# Patient Record
Sex: Female | Born: 1975 | Hispanic: No | Marital: Married | Smoking: Never smoker
Health system: Southern US, Community
[De-identification: ages and names within clinical notes are randomized; demographics above are authoritative.]

## PROBLEM LIST (undated history)

## (undated) DIAGNOSIS — R000 Tachycardia, unspecified: Secondary | ICD-10-CM

## (undated) DIAGNOSIS — F438 Other reactions to severe stress: Secondary | ICD-10-CM

## (undated) DIAGNOSIS — D179 Benign lipomatous neoplasm, unspecified: Secondary | ICD-10-CM

## (undated) DIAGNOSIS — F419 Anxiety disorder, unspecified: Secondary | ICD-10-CM

## (undated) DIAGNOSIS — F4389 Other reactions to severe stress: Secondary | ICD-10-CM

## (undated) DIAGNOSIS — T7840XA Allergy, unspecified, initial encounter: Secondary | ICD-10-CM

## (undated) DIAGNOSIS — D219 Benign neoplasm of connective and other soft tissue, unspecified: Secondary | ICD-10-CM

## (undated) DIAGNOSIS — D649 Anemia, unspecified: Secondary | ICD-10-CM

## (undated) DIAGNOSIS — R51 Headache: Secondary | ICD-10-CM

## (undated) DIAGNOSIS — R002 Palpitations: Secondary | ICD-10-CM

## (undated) DIAGNOSIS — H8101 Meniere's disease, right ear: Secondary | ICD-10-CM

## (undated) DIAGNOSIS — K219 Gastro-esophageal reflux disease without esophagitis: Secondary | ICD-10-CM

## (undated) HISTORY — DX: Meniere's disease, right ear: H81.01

## (undated) HISTORY — DX: Other reactions to severe stress: F43.8

## (undated) HISTORY — DX: Palpitations: R00.2

## (undated) HISTORY — DX: Anxiety disorder, unspecified: F41.9

## (undated) HISTORY — DX: Anemia, unspecified: D64.9

## (undated) HISTORY — PX: NO PAST SURGERIES: SHX2092

## (undated) HISTORY — DX: Benign lipomatous neoplasm, unspecified: D17.9

## (undated) HISTORY — DX: Gastro-esophageal reflux disease without esophagitis: K21.9

## (undated) HISTORY — DX: Allergy, unspecified, initial encounter: T78.40XA

## (undated) HISTORY — DX: Benign neoplasm of connective and other soft tissue, unspecified: D21.9

## (undated) HISTORY — DX: Other reactions to severe stress: F43.89

## (undated) HISTORY — DX: Headache: R51

---

## 2000-12-14 LAB — OB RESULTS CONSOLE RUBELLA ANTIBODY, IGM: Rubella: IMMUNE

## 2000-12-14 LAB — OB RESULTS CONSOLE ABO/RH: RH Type: POSITIVE

## 2000-12-14 LAB — OB RESULTS CONSOLE ANTIBODY SCREEN: Antibody Screen: NEGATIVE

## 2000-12-14 LAB — OB RESULTS CONSOLE HEPATITIS B SURFACE ANTIGEN: Hepatitis B Surface Ag: NEGATIVE

## 2010-03-10 ENCOUNTER — Ambulatory Visit: Admit: 2010-03-10 | Payer: Self-pay | Admitting: Family

## 2010-04-07 ENCOUNTER — Encounter: Payer: Self-pay | Admitting: Family Medicine

## 2010-04-28 ENCOUNTER — Encounter: Payer: Self-pay | Admitting: Family Medicine

## 2010-04-28 ENCOUNTER — Other Ambulatory Visit: Payer: Self-pay | Admitting: Family Medicine

## 2010-04-28 ENCOUNTER — Ambulatory Visit (INDEPENDENT_AMBULATORY_CARE_PROVIDER_SITE_OTHER): Payer: BC Managed Care – PPO | Admitting: Family Medicine

## 2010-04-28 DIAGNOSIS — F438 Other reactions to severe stress: Secondary | ICD-10-CM

## 2010-04-28 DIAGNOSIS — R519 Headache, unspecified: Secondary | ICD-10-CM | POA: Insufficient documentation

## 2010-04-28 DIAGNOSIS — R51 Headache: Secondary | ICD-10-CM

## 2010-04-28 DIAGNOSIS — D179 Benign lipomatous neoplasm, unspecified: Secondary | ICD-10-CM | POA: Insufficient documentation

## 2010-04-28 DIAGNOSIS — Z23 Encounter for immunization: Secondary | ICD-10-CM

## 2010-04-28 DIAGNOSIS — R002 Palpitations: Secondary | ICD-10-CM

## 2010-04-28 DIAGNOSIS — F4389 Other reactions to severe stress: Secondary | ICD-10-CM | POA: Insufficient documentation

## 2010-04-28 LAB — BASIC METABOLIC PANEL
BUN: 16 mg/dL (ref 6–23)
Creatinine, Ser: 0.8 mg/dL (ref 0.4–1.2)
GFR: 83.46 mL/min (ref >60.00)
Glucose, Bld: 85 mg/dL (ref 70–99)

## 2010-04-28 LAB — CBC WITH DIFFERENTIAL/PLATELET
Basophils Relative: 0.3 % (ref 0.0–3.0)
Eosinophils Absolute: 0.3 10*3/uL (ref 0.0–0.7)
HCT: 39.3 % (ref 36.0–46.0)
Hemoglobin: 13.4 g/dL (ref 12.0–15.0)
Lymphs Abs: 2.7 10*3/uL (ref 0.7–4.0)
MCHC: 34.2 g/dL (ref 30.0–36.0)
MCV: 84.3 fl (ref 78.0–100.0)
Monocytes Absolute: 0.7 10*3/uL (ref 0.1–1.0)
Neutro Abs: 5.9 10*3/uL (ref 1.4–7.7)
Neutrophils Relative %: 61.3 % (ref 43.0–77.0)
RBC: 4.66 Mil/uL (ref 3.87–5.11)

## 2010-04-28 LAB — SEDIMENTATION RATE: Sed Rate: 15 mm/hr (ref 0–22)

## 2010-04-28 LAB — TSH: TSH: 0.96 u[IU]/mL (ref 0.35–5.50)

## 2010-04-28 LAB — HEPATIC FUNCTION PANEL
Albumin: 4.2 g/dL (ref 3.5–5.2)
Total Bilirubin: 0.4 mg/dL (ref 0.3–1.2)

## 2010-05-02 NOTE — Assessment & Plan Note (Signed)
Summary: new to estab/cbs   Vital Signs:  Patient profile:   35 year old female Height:      64.25 inches Weight:      162 pounds BMI:     27.69 Temp:     98.9 degrees F oral Pulse rate:   72 / minute BP sitting:   120 / 76  (left arm)  Vitals Entered By: Jeremy Johann CMA (April 28, 2010 1:02 PM) CC: new to establish, lump on right lower back, increase pulse rate, no fasting   History of Present Illness: Pt here to establish. Pt c/o of lump R side back.  Pt had gyn-Dr Tracey Harries --look at it and he reccommended she have someone look at it.   Pt c/o pulse rate going up for no reason.  Pt saw DR in [place].  She was dx with stress/ anxiety --she was given what sounds like xanax  and inderal for 1 month.  Everything stopped but now she has had problems with palpatations and heart rate goes up to 160 with min exertion.  She also has chest pain in left side of chest ---no radiation.    Pt also c/o headache 1-2 x a month---she was told they were vascular headaches.  They are worse with more sleep.     Preventive Screening-Counseling & Management  Alcohol-Tobacco     Smoking Status: never      Drug Use:  no.    Problems Prior to Update: 1)  Headache  (ICD-784.0) 2)  Other Acute Reactions To Stress  (ICD-308.3) 3)  Palpitations, Recurrent  (ICD-785.1)  Medications Prior to Update: 1)  None  Current Medications (verified): 1)  None  Allergies (verified): No Known Drug Allergies  Past History:  Past Medical History: none  Past Surgical History: none  Family History: Reviewed history and no changes required. HTN-BOTH parrents  Social History: Reviewed history and no changes required. Married Never Smoked Alcohol use-no Drug use-no Smoking Status:  never Drug Use:  no  Review of Systems      See HPI  Physical Exam  General:  Well-developed,well-nourished,in no acute distress; alert,appropriate and cooperative throughout examination Neck:  No deformities,  masses, or tenderness noted. Lungs:  Normal respiratory effort, chest expands symmetrically. Lungs are clear to auscultation, no crackles or wheezes. Heart:  normal rate and no murmur.   Extremities:  No clubbing, cyanosis, edema, or deformity noted with normal full range of motion of all joints.   Skin:  Intact without suspicious lesions or rashes lipoma R side back Psych:  Oriented X3, normally interactive, not anxious appearing, and not depressed appearing.     Impression & Recommendations:  Problem # 1:  HEADACHE (ICD-784.0) Assessment Deteriorated  Orders: Venipuncture (04540) TLB-CBC Platelet - w/Differential (85025-CBCD) TLB-BMP (Basic Metabolic Panel-BMET) (80048-METABOL) TLB-Hepatic/Liver Function Pnl (80076-HEPATIC) TLB-TSH (Thyroid Stimulating Hormone) (84443-TSH) TLB-Sedimentation Rate (ESR) (85652-ESR) Specimen Handling (98119)  Problem # 2:  OTHER ACUTE REACTIONS TO STRESS (ICD-308.3) Assessment: Improved  Orders: Venipuncture (14782) TLB-CBC Platelet - w/Differential (85025-CBCD) TLB-BMP (Basic Metabolic Panel-BMET) (80048-METABOL) TLB-Hepatic/Liver Function Pnl (80076-HEPATIC) TLB-TSH (Thyroid Stimulating Hormone) (84443-TSH) TLB-Sedimentation Rate (ESR) (85652-ESR) Specimen Handling (95621)  Problem # 3:  PALPITATIONS, RECURRENT (ICD-785.1) Assessment: Deteriorated  Orders: Venipuncture (30865) TLB-CBC Platelet - w/Differential (85025-CBCD) TLB-BMP (Basic Metabolic Panel-BMET) (80048-METABOL) TLB-Hepatic/Liver Function Pnl (80076-HEPATIC) TLB-TSH (Thyroid Stimulating Hormone) (84443-TSH) TLB-Sedimentation Rate (ESR) (85652-ESR) Cardiology Referral (Cardiology) Specimen Handling (78469) EKG w/ Interpretation (93000)  Avoid caffeine, chocolate, and stimulants. Stress reduction as well as medication options discussed.  Problem # 4:  LIPOMA (ICD-214.9)  Other Orders: Tdap => 17yrs IM (62130) Admin 1st Vaccine (86578)   Orders Added: 1)   Venipuncture [46962] 2)  TLB-CBC Platelet - w/Differential [85025-CBCD] 3)  TLB-BMP (Basic Metabolic Panel-BMET) [80048-METABOL] 4)  TLB-Hepatic/Liver Function Pnl [80076-HEPATIC] 5)  TLB-TSH (Thyroid Stimulating Hormone) [84443-TSH] 6)  TLB-Sedimentation Rate (ESR) [85652-ESR] 7)  Cardiology Referral [Cardiology] 8)  Specimen Handling [99000] 9)  Tdap => 44yrs IM [90715] 10)  Admin 1st Vaccine [90471] 11)  New Patient Level III [95284] 12)  EKG w/ Interpretation [93000]   Immunizations Administered:  Tetanus Vaccine:    Vaccine Type: Tdap    Site: left deltoid    Mfr: Merck    Dose: 0.5 ml    Route: IM    Given by: Almeta Monas CMA (AAMA)    Exp. Date: 01/06/2012    Lot #: [id number]    VIS given: 12/31/07 version given April 28, 2010.   Immunizations Administered:  Tetanus Vaccine:    Vaccine Type: Tdap    Site: left deltoid    Mfr: Merck    Dose: 0.5 ml    Route: IM    Given by: Almeta Monas CMA (AAMA)    Exp. Date: 01/06/2012    Lot #: [id number]    VIS given: 12/31/07 version given April 28, 2010.  Flu Vaccine Next Due:  Refused TD Result Date:  04/29/2002 TD Result:  given TD Next Due:  10 yr PAP Result Date:  04/07/2010 PAP Result:  normal PAP Next Due:  1 yr

## 2010-05-04 NOTE — Progress Notes (Signed)
Letter mailed to patient with results...Marland KitchenMarland KitchenMarland Kitchen KP

## 2010-05-12 ENCOUNTER — Encounter: Payer: Self-pay | Admitting: *Deleted

## 2010-05-16 ENCOUNTER — Encounter: Payer: Self-pay | Admitting: Cardiovascular Disease

## 2010-05-16 NOTE — Letter (Signed)
Summary: Primary Care Consult Scheduled Letter  [facility] [place]  [address] [place], [place] [address]   Phone: [phone]  Fax: [phone]      05/12/2010 MRN: [id number]  Rachel Wiley [address] [place], [place]  [address]  [place]    Dear [name],      We have scheduled an appointment for you.  At the recommendation of Dr  Janit Bern, we have scheduled you a consult with Dr.Peter Barbaraann Cao on April 4,2012 at 2pm.  Their address is [address], Adult nurse Cardiology. The office phone number is [phone].  If this appointment day and time is not convenient for you, please feel free to call the office of the doctor you are being referred to at the number listed above and reschedule the appointment.     It is important for you to keep your scheduled appointments. We are here to make sure you are given good patient care. If you have questions or you have made changes to your appointment, please notify us at  [phone], ask for Renee.    Thank you,  Patient Care Coordinator [facility]

## 2010-05-17 ENCOUNTER — Ambulatory Visit (INDEPENDENT_AMBULATORY_CARE_PROVIDER_SITE_OTHER): Payer: BC Managed Care – PPO | Admitting: Cardiovascular Disease

## 2010-05-17 ENCOUNTER — Encounter: Payer: Self-pay | Admitting: Cardiovascular Disease

## 2010-05-17 DIAGNOSIS — R0600 Dyspnea, unspecified: Secondary | ICD-10-CM

## 2010-05-17 DIAGNOSIS — R0989 Other specified symptoms and signs involving the circulatory and respiratory systems: Secondary | ICD-10-CM

## 2010-05-17 DIAGNOSIS — R002 Palpitations: Secondary | ICD-10-CM

## 2010-05-17 NOTE — Assessment & Plan Note (Signed)
Benign sounding with occasional dyspnea.  Stress echo to assess EF and see HR response to exercise.  Normal ECG and cardiopulmonary exam.  Encouraged her to take inderal as needed.  She still has supply from [place].  If symptoms become more frequent can get an event monitor

## 2010-05-17 NOTE — Patient Instructions (Signed)
Your physician has requested that you have a stress echocardiogram. For further information please visit [email or web address]. Please follow instruction sheet as given.   

## 2010-05-17 NOTE — Progress Notes (Signed)
35 yo referred by Dr Laury Axon for palpitatins and dyspnea.  W/U in [place] last year ? Stress.  Improved with Inderal 40mg  but this was stopped last year.  Last couple of months occasional episodes of palpitations and noticing her HR. Seems to have a propensity for exaggerated HR response to exercise.  Reviewed labs by Dr Laury Axon and normal with no anemia or thyroid disease.  Some stress with living arrangements as her and her husband have been back and forth to [place].  No SSCP and mild dyspnea with walking.  No stimulants and only 2 cups of tea /day.  Has not had SSCP, presyncope, edema.  Sometimes headaches have made her pulse faster.   ROS: Denies fever, malais, weight loss, blurry vision, decreased visual acuity, cough, sputum, SOB, hemoptysis, pleuritic pain, palpitaitons, heartburn, abdominal pain, melena, lower extremity edema, claudication, or rash.   General: Affect appropriate Healthy:  appears stated age HEENT: normal Neck supple with no adenopathy JVP normal no bruits no thyromegaly Lungs clear with no wheezing and good diaphragmatic motion Heart:  S1/S2 no murmur,rub, gallop or click PMI normal Abdomen: benighn, BS positve, no tenderness, no AAA no bruit.  No HSM or HJR Distal pulses intact with no bruits No edema Neuro non-focal Skin warm and dry No muscular weakness  Medications No current outpatient prescriptions on file.    Allergies Review of patient's allergies indicates no known allergies.  Family History: Family History  Problem Relation Age of Onset  . Hypertension      Social History: History   Social History  . Marital Status: Married    Spouse Name: N/A    Number of Children: N/A  . Years of Education: N/A   Occupational History  . Not on file.   Social History Main Topics  . Smoking status: Never Smoker   . Smokeless tobacco: Not on file  . Alcohol Use: No  . Drug Use: No  . Sexually Active: Not on file   Other Topics Concern  . Not on file    Social History Narrative   Never SmokedAlcohol use-noDrug use-noSmoking Status:  neverDrug Use:  no    Electrocardiogram:  04/28/10  NSR normal ECG minor sinus arrhythmia  Assessment and Plan

## 2010-05-23 ENCOUNTER — Encounter: Payer: Self-pay | Admitting: Family Medicine

## 2010-05-25 ENCOUNTER — Other Ambulatory Visit: Payer: Self-pay | Admitting: Cardiovascular Disease

## 2010-05-25 DIAGNOSIS — R0600 Dyspnea, unspecified: Secondary | ICD-10-CM

## 2010-05-30 ENCOUNTER — Other Ambulatory Visit (HOSPITAL_COMMUNITY): Payer: BC Managed Care – PPO | Admitting: Radiology

## 2010-06-13 ENCOUNTER — Ambulatory Visit (HOSPITAL_COMMUNITY): Payer: BC Managed Care – PPO | Attending: Cardiovascular Disease | Admitting: Radiology

## 2010-06-13 ENCOUNTER — Encounter: Payer: BC Managed Care – PPO | Admitting: Cardiovascular Disease

## 2010-06-13 ENCOUNTER — Ambulatory Visit (INDEPENDENT_AMBULATORY_CARE_PROVIDER_SITE_OTHER): Payer: BC Managed Care – PPO | Admitting: Cardiovascular Disease

## 2010-06-13 DIAGNOSIS — R0989 Other specified symptoms and signs involving the circulatory and respiratory systems: Secondary | ICD-10-CM | POA: Insufficient documentation

## 2010-06-13 DIAGNOSIS — R002 Palpitations: Secondary | ICD-10-CM

## 2010-06-13 DIAGNOSIS — R0600 Dyspnea, unspecified: Secondary | ICD-10-CM

## 2010-06-13 DIAGNOSIS — R0609 Other forms of dyspnea: Secondary | ICD-10-CM | POA: Insufficient documentation

## 2010-06-13 NOTE — Progress Notes (Signed)
Exercise Treadmill Test  Pre-Exercise Testing Evaluation Rhythm: normal sinus  Rate: 92   PR:  .13 QRS:  .09  QT:  .36 QTc: .45   P axis: 0 degrees  QRS axis:  0 degrees       Test  Exercise Tolerance Test Ordering MD: Charlton Haws, MD  Interpreting MD:  Charlton Haws, MD  Unique Test No:1 Treadmill:  1  Indication for ETT: palpitations  Contraindication to ETT: No   Stress Modality: exercise - treadmill  Cardiac Imaging Performed: non   Protocol: standard Bruce - maximal  Max BP:  144/78  Max MPHR (bpm):  186 85% MPR (bpm):  158  MPHR obtained (bpm):  181 % MPHR obtained:  96%  Reached 85% MPHR (min:sec):  3:00 Total Exercise Time (min-sec): 6:00  Workload in METS:  10.0 Borg Scale: 17  Reason ETT Terminated:  fatigue    ST Segment Analysis At Rest: normal ST segments - no evidence of significant ST depression With Exercise: no evidence of significant ST depression  Other Information Arrhythmia:  No Angina during ETT:  absent (0) Quality of ETT:  diagnostic  ETT Interpretation:  normal - no evidence of ischemia by ST analysis  Comments: Normal ETT with normal HR response   Stages advance at 2:00 intervals  Recommendations: No further cardiac w/u needed

## 2010-07-27 ENCOUNTER — Telehealth: Payer: Self-pay | Admitting: *Deleted

## 2010-07-27 NOTE — Telephone Encounter (Signed)
PT AWARE STRESS ECHO WAS OKAY./CY

## 2010-10-27 ENCOUNTER — Telehealth: Payer: Self-pay | Admitting: Cardiovascular Disease

## 2010-10-27 NOTE — Telephone Encounter (Signed)
Pt on med for palpitations, now [redacted] weeks pregnant, is this ok to take ?

## 2010-10-30 NOTE — Telephone Encounter (Signed)
Discussed pt issues with the pharm md. Inderal is not the best to use in preg but is okay if used sparingly. Per the pt she is having to use the inderal once or twice a month and wants to know what would be safe for her to use. Dr Eden Emms is out this week. Will forward for sally putt pharm md to review and advise Deliah Goody

## 2010-10-30 NOTE — Telephone Encounter (Signed)
Pt [redacted] weeks pregnant. Pt wants to know what medication is safe for pt to take with palpitations . Please return pt call to discuss further.

## 2010-11-07 NOTE — Telephone Encounter (Signed)
Spoke with pt, made aware to only use 1-2 times monthly if needed  [employer]

## 2010-11-07 NOTE — Telephone Encounter (Signed)
Propranolol is category C in pregnancy.  Based on small studies (mostly in pt's on beta blocker for HTN), daily use can cause a smaller birth weight but chance is very small if only using 1-2 times per month.

## 2010-11-09 ENCOUNTER — Encounter: Payer: Self-pay | Admitting: Family Medicine

## 2010-11-09 ENCOUNTER — Ambulatory Visit (INDEPENDENT_AMBULATORY_CARE_PROVIDER_SITE_OTHER): Payer: BC Managed Care – PPO | Admitting: Family Medicine

## 2010-11-09 VITALS — BP 112/74 | HR 75 | Temp 99.4°F | Wt 154.8 lb

## 2010-11-09 DIAGNOSIS — J029 Acute pharyngitis, unspecified: Secondary | ICD-10-CM

## 2010-11-09 DIAGNOSIS — J329 Chronic sinusitis, unspecified: Secondary | ICD-10-CM

## 2010-11-09 LAB — POCT RAPID STREP A (OFFICE): Rapid Strep A Screen: NEGATIVE

## 2010-11-09 MED ORDER — CEFUROXIME AXETIL 500 MG PO TABS: 500.0000 mg | ORAL_TABLET | Freq: Two times a day (BID) | ORAL | Status: DC

## 2010-11-09 NOTE — Progress Notes (Signed)
  Subjective:     Rachel Wiley is a 35 y.o. female who presents for evaluation of symptoms of a URI. Symptoms include bilateral ear pressure/pain, congestion, low grade fever, nasal congestion, productive cough with  yellow and green colored sputum and sore throat. Onset of symptoms was 7 days ago, and has been gradually worsening since that time. Treatment to date: none.  The following portions of the patient's history were reviewed and updated as appropriate: allergies, current medications, past family history, past medical history, past social history, past surgical history and problem list.  Review of Systems Pertinent items are noted in HPI.   Objective:    BP 112/74  Pulse 75  Temp(Src) 99.4 F (37.4 C) (Oral)  Wt 154 lb 12.8 oz (70.217 kg)  SpO2 96% General appearance: alert, cooperative, appears stated age and no distress Ears: normal TM's and external ear canals both ears Nose: turbinates red, swollen, edematous, right turbinate swollen, edematous, inflamed, sinus tenderness bilateral Throat: lips, mucosa, and tongue normal; teeth and gums normal Neck: no adenopathy, no carotid bruit, no JVD, supple, symmetrical, trachea midline and thyroid not enlarged, symmetric, no tenderness/mass/nodules Lungs: clear to auscultation bilaterally Heart: S1, S2 normal   Assessment:    sinusitis   Plan:    Suggested symptomatic OTC remedies. Nasal saline spray for congestion. Ceftin per orders. Follow up as needed.  Pt refused nasal spray

## 2010-11-09 NOTE — Patient Instructions (Signed)

## 2010-11-09 NOTE — Progress Notes (Signed)
Addended by: Arnette Norris on: 11/09/2010 01:38 PM   Modules accepted: Orders

## 2010-11-10 ENCOUNTER — Telehealth: Payer: Self-pay | Admitting: Family Medicine

## 2010-11-10 MED ORDER — CEFDINIR 250 MG/5ML PO SUSR: ORAL | Status: DC

## 2010-11-10 NOTE — Telephone Encounter (Signed)
patient aware Rx sent to the pharmacy     KP

## 2010-11-10 NOTE — Telephone Encounter (Signed)
Please advise Pt given Ceftin for sinusitis

## 2010-11-10 NOTE — Telephone Encounter (Signed)
omnicef 250/59ml 1 tsp po bid for 10 days

## 2010-11-10 NOTE — Telephone Encounter (Signed)
Patient was given antibiotic yesterday she said she is unable to take it she said she cant get it down & if she does she vomits - patient is [redacted] weeks pregnant - wants to know if she can get liquid - [facility] [place]

## 2010-12-15 LAB — OB RESULTS CONSOLE RPR: RPR: NONREACTIVE

## 2011-02-07 ENCOUNTER — Encounter: Payer: Self-pay | Admitting: Family Medicine

## 2011-02-07 ENCOUNTER — Ambulatory Visit (INDEPENDENT_AMBULATORY_CARE_PROVIDER_SITE_OTHER): Payer: BC Managed Care – PPO | Admitting: Family Medicine

## 2011-02-07 VITALS — BP 115/70 | HR 115 | Temp 98.6°F | Ht 64.75 in | Wt 162.6 lb

## 2011-02-07 DIAGNOSIS — J069 Acute upper respiratory infection, unspecified: Secondary | ICD-10-CM

## 2011-02-07 NOTE — Assessment & Plan Note (Signed)
Pt w/out evidence of bacterial infxn on PE.  Most likely viral.  Reviewed supportive care and red flags that should prompt return.  Pt expressed understanding and is in agreement w/ plan.

## 2011-02-07 NOTE — Patient Instructions (Signed)
This is a virus and should improve w/ time Drink plenty of fluids REST! Tylenol for sore throat pain Use Delsym as needed for cough- this is safe in pregnancy Call your OB and ask about a decongestant that you can take- this will help the nasal congestion, ear pain, and drainage Call with any questions or concerns Hang in there! Happy New Year!

## 2011-02-07 NOTE — Progress Notes (Signed)
  Subjective:    Patient ID: Rachel Wiley, female    DOB: November 22, 1975, 35 y.o.   MRN: [id number]  HPI URI- sxs started Monday night w/ L ear pain.  + nasal congestion, sore throat, LAD, cough productive of PND.  No fevers.  + sick contacts.   Review of Systems For ROS see HPI     Objective:   Physical Exam  Vitals reviewed. Constitutional: She appears well-developed and well-nourished. No distress.  HENT:  Head: Normocephalic and atraumatic.  Right Ear: Tympanic membrane normal.  Left Ear: Tympanic membrane normal.  Nose: Mucosal edema and rhinorrhea present. Right sinus exhibits no maxillary sinus tenderness and no frontal sinus tenderness. Left sinus exhibits no maxillary sinus tenderness and no frontal sinus tenderness.  Mouth/Throat: Mucous membranes are normal. Posterior oropharyngeal erythema (w/ PND) present.  Eyes: Conjunctivae and EOM are normal. Pupils are equal, round, and reactive to light.  Neck: Normal range of motion. Neck supple.  Cardiovascular: Normal rate, regular rhythm and normal heart sounds.   Pulmonary/Chest: Effort normal and breath sounds normal. No respiratory distress. She has no wheezes. She has no rales.  Lymphadenopathy:    She has no cervical adenopathy.          Assessment & Plan:

## 2011-02-13 NOTE — L&D Delivery Note (Signed)
Delivery Note At 8:38 PM a viable female was delivered via Vaginal, Spontaneous Delivery (Presentation: Left Occiput Anterior, LOT).  APGAR: 8, 9; weight .   Placenta status: Intact, Spontaneous.  Cord: 3 vessels with the following complications: None.    Anesthesia: Epidural  Episiotomy: None Lacerations: 2nd degree;Perineal Suture Repair: 3.0 vicryl rapide Est. Blood Loss (mL): 400  Mom to postpartum.  Baby to nursery-stable.  BOVARD,Deshaun Schou 06/18/2011, 8:58 PM   O+/ Breast/ Contra?

## 2011-03-15 ENCOUNTER — Encounter: Payer: Self-pay | Admitting: Nurse Practitioner

## 2011-03-15 ENCOUNTER — Ambulatory Visit (INDEPENDENT_AMBULATORY_CARE_PROVIDER_SITE_OTHER): Payer: BC Managed Care – PPO | Admitting: Nurse Practitioner

## 2011-03-15 VITALS — BP 110/80 | Ht 64.0 in | Wt 166.0 lb

## 2011-03-15 DIAGNOSIS — R000 Tachycardia, unspecified: Secondary | ICD-10-CM

## 2011-03-15 DIAGNOSIS — R002 Palpitations: Secondary | ICD-10-CM

## 2011-03-15 NOTE — Progress Notes (Signed)
   Luciana Axe Date of Birth: 1975-04-04 Medical Record #[id number]  History of Present Illness: Ms. Kossman is seen today for a work in visit. She is seen for. Dr. Eden Emms. She has had a history of palpitations and has had a normal stress echo last May. Her EF was normal. She did have grade 2 diastolic dysfunction. She is now pregnant at [redacted] weeks.  She has been referred from her OB for tachycardia. She notes that this past Tuesday she had a little headache. Blood pressure was up slightly and her heart was elevated. She is not having palpitations. She is not short of breath. No chest pain. No cough. No hemoptysis.  She is not lightheaded or dizzy. No syncope. She says her hemoglobin was checked and was fine.  Current Outpatient Prescriptions on File Prior to Visit  Medication Sig Dispense Refill  . folic acid (FOLVITE) 400 MCG tablet Take 600 mcg by mouth daily.        . Prenatal Vit-Fe Fumarate-FA (MULTIVITAMIN-PRENATAL) 27-0.8 MG TABS Take 1 tablet by mouth daily.          No Known Allergies  Past Medical History  Diagnosis Date  . Anxiety   . Palpitations   . Other acute reactions to stress   . Headache   . Lipoma of unspecified site     No past surgical history on file.  History  Smoking status  . Never Smoker   Smokeless tobacco  . Not on file    History  Alcohol Use No    Family History  Problem Relation Age of Onset  . Hypertension      Review of Systems: The review of systems is per the HPI.  All other systems were reviewed and are negative.  Physical Exam: BP 110/80  Ht 5\' 4"  (1.626 m)  Wt 166 lb (75.297 kg)  BMI 28.49 kg/m2 Patient is very pleasant and in no acute distress. Skin is warm and dry. Color is normal.  HEENT is unremarkable. Normocephalic/atraumatic. PERRL. Sclera are nonicteric. Neck is supple. No masses. No JVD. Lungs are clear. No dyspnea noted. Cardiac exam shows a regular rate and rhythm. She is tachycardic.  No murmur noted. Abdomen is soft. She  is pregnant.  Extremities are without edema. Gait and ROM are intact. No gross neurologic deficits noted.   LABORATORY DATA: EKG today shows sinus tachycardia. Rate is 112. Tracing is reviewed with Dr. Graciela Husbands. Prior EKG was also reviewed from 2012. This also shows sinus tachycardia. Her p wave morphology is unchanged.   Assessment / Plan:

## 2011-03-15 NOTE — Patient Instructions (Signed)
I have talked with Dr. Graciela Husbands. We feel like you are ok.  Let us know if you start having any palpitations or are short of breath.  We will see you back as needed.  Call [facility] at [phone] if you have any questions, problems or concerns.

## 2011-03-15 NOTE — Assessment & Plan Note (Addendum)
Her case is reviewed with Dr. Graciela Husbands. Her EKG is felt to be unchanged. P wave morphology is unchanged. She appears asymptomatic at this time and denies symptoms. We do not feel like further testing is indicated at this time. Her echo from 2012 was also reviewed and felt to be satisfactory. We will see her back as needed. She is asking if she can do some deep breathing exercises and I think that is ok for her. Patient is agreeable to this plan and will call if any problems develop in the interim.

## 2011-05-15 ENCOUNTER — Encounter: Payer: Self-pay | Admitting: Family Medicine

## 2011-05-15 ENCOUNTER — Ambulatory Visit (INDEPENDENT_AMBULATORY_CARE_PROVIDER_SITE_OTHER): Payer: BC Managed Care – PPO | Admitting: Family Medicine

## 2011-05-15 ENCOUNTER — Other Ambulatory Visit: Payer: Self-pay | Admitting: *Deleted

## 2011-05-15 DIAGNOSIS — J309 Allergic rhinitis, unspecified: Secondary | ICD-10-CM

## 2011-05-15 DIAGNOSIS — M549 Dorsalgia, unspecified: Secondary | ICD-10-CM

## 2011-05-15 DIAGNOSIS — R32 Unspecified urinary incontinence: Secondary | ICD-10-CM

## 2011-05-15 MED ORDER — BUDESONIDE 32 MCG/ACT NA SUSP: 1.0000 | Freq: Every day | NASAL | Status: DC

## 2011-05-15 NOTE — Telephone Encounter (Signed)
Pt husband left vm stating that her medication ordered for today did not make it to the pharmacy as noted, please advise

## 2011-05-15 NOTE — Patient Instructions (Signed)

## 2011-05-15 NOTE — Telephone Encounter (Signed)
Rx resent and patient has been made aware.     KP

## 2011-05-15 NOTE — Progress Notes (Signed)
  Subjective:     Rachel Wiley is a 36 y.o. female who presents for evaluation of symptoms of a URI. Symptoms include bilateral ear pressure/pain, congestion, facial pain, low grade fever, nasal congestion, post nasal drip, purulent nasal discharge, sinus pressure and sore throat. Onset of symptoms was 2 days ago, and has been gradually worsening since that time. Treatment to date: tylenol.  Pt is pregnant. The following portions of the patient's history were reviewed and updated as appropriate: allergies, current medications, past family history, past medical history, past social history, past surgical history and problem list.  Review of Systems Pertinent items are noted in HPI.   Objective:    BP 100/72  Pulse 125  Temp(Src) 98.5 F (36.9 C) (Oral)  Wt 175 lb 6.4 oz (79.561 kg)  SpO2 98% General appearance: alert, cooperative, appears stated age and no distress Ears: normal TM's and external ear canals both ears Nose: yellow discharge, mild congestion, turbinates red, swollen Throat: lips, mucosa, and tongue normal; teeth and gums normal Neck: no adenopathy, supple, symmetrical, trachea midline and thyroid not enlarged, symmetric, no tenderness/mass/nodules Lungs: clear to auscultation bilaterally Heart: S1, S2 normal Lymph nodes: Cervical, supraclavicular, and axillary nodes normal.   Assessment:    viral upper respiratory illness   Plan:    Discussed diagnosis and treatment of URI. Discussed the importance of avoiding unnecessary antibiotic therapy. Suggested symptomatic OTC remedies. Nasal saline spray for congestion. Nasal steroids per orders. Follow up as needed.

## 2011-05-18 ENCOUNTER — Telehealth: Payer: Self-pay

## 2011-05-18 NOTE — Telephone Encounter (Signed)
Patient left a message stating that her Rhinocort needs a prior authorization? Pharmacy is going to send over prior authorization paperwork.

## 2011-05-18 NOTE — Telephone Encounter (Signed)
Noted will process prior authorization when noted in office

## 2011-05-25 ENCOUNTER — Telehealth: Payer: Self-pay | Admitting: *Deleted

## 2011-05-25 NOTE — Telephone Encounter (Signed)
Rhinocort is a non preferred med, Preferred drugs: Flunisolide, fluticasone, nasonex, triamcinolone. .Please advise

## 2011-05-27 NOTE — Telephone Encounter (Signed)
Ok for [employer] 2 sprays each nostril daily

## 2011-05-28 MED ORDER — FLUTICASONE PROPIONATE 50 MCG/ACT NA SUSP: 2.0000 | Freq: Every day | NASAL | Status: DC

## 2011-05-28 NOTE — Telephone Encounter (Signed)
Discuss with patient, Rx sent. 

## 2011-05-29 LAB — OB RESULTS CONSOLE GBS: GBS: NEGATIVE

## 2011-05-29 NOTE — Telephone Encounter (Signed)
Noted prior authorization submitted/completed.rx sent and pt notified

## 2011-06-17 ENCOUNTER — Inpatient Hospital Stay (HOSPITAL_COMMUNITY)
Admission: AD | Admit: 2011-06-17 | Discharge: 2011-06-17 | Disposition: A | Discharge status: Home / Self Care | Payer: BC Managed Care – PPO | Source: Ambulatory Visit | Attending: Obstetrics and Gynecology | Admitting: Obstetrics and Gynecology

## 2011-06-17 ENCOUNTER — Encounter (HOSPITAL_COMMUNITY): Payer: Self-pay | Admitting: *Deleted

## 2011-06-17 DIAGNOSIS — O4690 Antepartum hemorrhage, unspecified, unspecified trimester: Secondary | ICD-10-CM | POA: Insufficient documentation

## 2011-06-17 DIAGNOSIS — O479 False labor, unspecified: Secondary | ICD-10-CM | POA: Insufficient documentation

## 2011-06-17 HISTORY — DX: Tachycardia, unspecified: R00.0

## 2011-06-17 NOTE — Progress Notes (Signed)
Pt up to BR

## 2011-06-17 NOTE — Discharge Instructions (Signed)
Normal Labor and Delivery Your caregiver must first be sure you are in labor. Signs of labor include:  You may pass what is called "the mucus plug" before labor begins. This is a small amount of blood stained mucus.   Regular uterine contractions.   The time between contractions get closer together.   The discomfort and pain gradually gets more intense.   Pains are mostly located in the back.   Pains get worse when walking.   The cervix (the opening of the uterus becomes thinner (begins to efface) and opens up (dilates).  Once you are in labor and admitted into the hospital or care center, your caregiver will do the following:  A complete physical examination.   Check your vital signs (blood pressure, pulse, temperature and the fetal heart rate).   Do a vaginal examination (using a sterile glove and lubricant) to determine:   The position (presentation) of the baby (head [vertex] or buttock first).   The level (station) of the baby's head in the birth canal.   The effacement and dilatation of the cervix.   You may have your pubic hair shaved and be given an enema depending on your caregiver and the circumstance.   An electronic monitor is usually placed on your abdomen. The monitor follows the length and intensity of the contractions, as well as the baby's heart rate.   Usually, your caregiver will insert an IV in your arm with a bottle of sugar water. This is done as a precaution so that medications can be given to you quickly during labor or delivery.  NORMAL LABOR AND DELIVERY IS DIVIDED UP INTO 3 STAGES: First Stage This is when regular contractions begin and the cervix begins to efface and dilate. This stage can last from 3 to 15 hours. The end of the first stage is when the cervix is 100% effaced and 10 centimeters dilated. Pain medications may be given by   Injection (morphine, demerol, etc.)   Regional anesthesia (spinal, caudal or epidural, anesthetics given in  different locations of the spine). Paracervical pain medication may be given, which is an injection of and anesthetic on each side of the cervix.  A pregnant woman may request to have "Natural Childbirth" which is not to have any medications or anesthesia during her labor and delivery. Second Stage This is when the baby comes down through the birth canal (vagina) and is born. This can take 1 to 4 hours. As the baby's head comes down through the birth canal, you may feel like you are going to have a bowel movement. You will get the urge to bear down and push until the baby is delivered. As the baby's head is being delivered, the caregiver will decide if an episiotomy (a cut in the perineum and vagina area) is needed to prevent tearing of the tissue in this area. The episiotomy is sewn up after the delivery of the baby and placenta. Sometimes a mask with nitrous oxide is given for the mother to breath during the delivery of the baby to help if there is too much pain. The end of Stage 2 is when the baby is fully delivered. Then when the umbilical cord stops pulsating it is clamped and cut. Third Stage The third stage begins after the baby is completely delivered and ends after the placenta (afterbirth) is delivered. This usually takes 5 to 30 minutes. After the placenta is delivered, a medication is given either by intravenous or injection to help contract   the uterus and prevent bleeding. The third stage is not painful and pain medication is usually not necessary. If an episiotomy was done, it is repaired at this time. After the delivery, the mother is watched and monitored closely for 1 to 2 hours to make sure there is no postpartum bleeding (hemorrhage). If there is a lot of bleeding, medication is given to contract the uterus and stop the bleeding. Document Released: 11/08/2007 Document Revised: 01/18/2011 Document Reviewed: 11/08/2007 [employer] Patient Information 2012 [employer]. 

## 2011-06-17 NOTE — Progress Notes (Signed)
Dr Jackelyn Knife notified of pt's admission and status. Aware of ctx pattern, sve, reactive FHR. Will observe an hr, reck cervix and may d/c home if no change or notify MD of change.

## 2011-06-17 NOTE — Progress Notes (Signed)
Written and verbal d/c instructions given and understanding voiced. 

## 2011-06-17 NOTE — MAU Note (Signed)
Yellow vaginal d/c Sat and now some bloody d/c. Contracitons since 1800

## 2011-06-17 NOTE — Progress Notes (Signed)
FHT from this am reviewed.  Reactive NST, no significant decels, irreg ctx, no cervical change.

## 2011-06-18 ENCOUNTER — Inpatient Hospital Stay (HOSPITAL_COMMUNITY)
Admission: AD | Admit: 2011-06-18 | Discharge: 2011-06-20 | DRG: 373 | Disposition: A | Discharge status: Home / Self Care | Payer: BC Managed Care – PPO | Source: Ambulatory Visit | Attending: Obstetrics and Gynecology | Admitting: Obstetrics and Gynecology

## 2011-06-18 ENCOUNTER — Encounter (HOSPITAL_COMMUNITY): Payer: Self-pay | Admitting: Anesthesiology

## 2011-06-18 ENCOUNTER — Encounter (HOSPITAL_COMMUNITY): Payer: Self-pay | Admitting: *Deleted

## 2011-06-18 ENCOUNTER — Inpatient Hospital Stay (HOSPITAL_COMMUNITY): Payer: BC Managed Care – PPO | Admitting: Anesthesiology

## 2011-06-18 DIAGNOSIS — O09529 Supervision of elderly multigravida, unspecified trimester: Secondary | ICD-10-CM | POA: Diagnosis present

## 2011-06-18 LAB — CBC
Hemoglobin: 13.6 g/dL (ref 12.0–15.0)
MCH: 28.6 pg (ref 26.0–34.0)
MCHC: 32.9 g/dL (ref 30.0–36.0)
RDW: 16.3 % — ABNORMAL HIGH (ref 11.5–15.5)

## 2011-06-18 MED ORDER — PHENYLEPHRINE 40 MCG/ML (10ML) SYRINGE FOR IV PUSH (FOR BLOOD PRESSURE SUPPORT)
80.0000 ug | PREFILLED_SYRINGE | INTRAVENOUS | Status: DC | PRN
  Filled 2011-06-18: qty 5

## 2011-06-18 MED ORDER — LACTATED RINGERS IV SOLN
INTRAVENOUS | Status: DC
  Administered 2011-06-18: 18:00:00 via INTRAVENOUS

## 2011-06-18 MED ORDER — OXYCODONE-ACETAMINOPHEN 5-325 MG PO TABS: 1.0000 | ORAL_TABLET | ORAL | Status: DC | PRN

## 2011-06-18 MED ORDER — PHENYLEPHRINE 40 MCG/ML (10ML) SYRINGE FOR IV PUSH (FOR BLOOD PRESSURE SUPPORT): 80.0000 ug | PREFILLED_SYRINGE | INTRAVENOUS | Status: DC | PRN

## 2011-06-18 MED ORDER — BUTORPHANOL TARTRATE 2 MG/ML IJ SOLN: 2.0000 mg | INTRAMUSCULAR | Status: DC | PRN

## 2011-06-18 MED ORDER — ACETAMINOPHEN 325 MG PO TABS: 650.0000 mg | ORAL_TABLET | ORAL | Status: DC | PRN

## 2011-06-18 MED ORDER — CITRIC ACID-SODIUM CITRATE 334-500 MG/5ML PO SOLN
30.0000 mL | ORAL | Status: DC | PRN
  Administered 2011-06-18: 30 mL via ORAL
  Filled 2011-06-18: qty 15

## 2011-06-18 MED ORDER — DIPHENHYDRAMINE HCL 50 MG/ML IJ SOLN: 12.5000 mg | INTRAMUSCULAR | Status: DC | PRN

## 2011-06-18 MED ORDER — LIDOCAINE HCL (PF) 1 % IJ SOLN
30.0000 mL | INTRAMUSCULAR | Status: DC | PRN
  Filled 2011-06-18: qty 30

## 2011-06-18 MED ORDER — OXYTOCIN 20 UNITS IN LACTATED RINGERS INFUSION - SIMPLE: 125.0000 mL/h | Freq: Once | INTRAVENOUS | Status: DC

## 2011-06-18 MED ORDER — FENTANYL 2.5 MCG/ML BUPIVACAINE 1/10 % EPIDURAL INFUSION (WH - ANES)
14.0000 mL/h | INTRAMUSCULAR | Status: DC
  Filled 2011-06-18: qty 60

## 2011-06-18 MED ORDER — EPHEDRINE 5 MG/ML INJ: 10.0000 mg | INTRAVENOUS | Status: DC | PRN

## 2011-06-18 MED ORDER — OXYTOCIN BOLUS FROM INFUSION
500.0000 mL | Freq: Once | INTRAVENOUS | Status: DC
  Filled 2011-06-18: qty 500
  Filled 2011-06-18: qty 1000

## 2011-06-18 MED ORDER — ONDANSETRON HCL 4 MG/2ML IJ SOLN: 4.0000 mg | Freq: Four times a day (QID) | INTRAMUSCULAR | Status: DC | PRN

## 2011-06-18 MED ORDER — LACTATED RINGERS IV SOLN: 500.0000 mL | INTRAVENOUS | Status: DC | PRN

## 2011-06-18 MED ORDER — FLEET ENEMA 7-19 GM/118ML RE ENEM: 1.0000 | ENEMA | RECTAL | Status: DC | PRN

## 2011-06-18 MED ORDER — LACTATED RINGERS IV SOLN: 500.0000 mL | Freq: Once | INTRAVENOUS | Status: DC

## 2011-06-18 MED ORDER — IBUPROFEN 600 MG PO TABS
600.0000 mg | ORAL_TABLET | Freq: Four times a day (QID) | ORAL | Status: DC | PRN
  Filled 2011-06-18: qty 1

## 2011-06-18 MED ORDER — EPHEDRINE 5 MG/ML INJ
10.0000 mg | INTRAVENOUS | Status: DC | PRN
  Filled 2011-06-18: qty 4

## 2011-06-18 MED ORDER — FENTANYL 2.5 MCG/ML BUPIVACAINE 1/10 % EPIDURAL INFUSION (WH - ANES)
INTRAMUSCULAR | Status: DC | PRN
  Administered 2011-06-18: 11 mL/h via EPIDURAL

## 2011-06-18 MED ORDER — SODIUM BICARBONATE 8.4 % IV SOLN
INTRAVENOUS | Status: DC | PRN
  Administered 2011-06-18: 4 mL via EPIDURAL

## 2011-06-18 NOTE — Anesthesia Preprocedure Evaluation (Signed)

## 2011-06-18 NOTE — Progress Notes (Signed)
Rachel Wiley is a 36 y.o. G2P1001 at [redacted]w[redacted]d admitted for active labor  Subjective: comf with epidural  Objective: BP 122/78  Pulse 98  Temp(Src) 97.6 F (36.4 C) (Oral)  Resp 18  Ht 5\' 4"  (1.626 m)  Wt 79.379 kg (175 lb)  BMI 30.04 kg/m2  SpO2 100%      FHT:  FHR: 140's bpm, variability: moderate,  accelerations:  Present,  decelerations:  Present early decel UC:   regular, every 2 minutes SVE:   Dilation: 10 Effacement (%): 100 Station: +1 Exam by:: Bovard, MD   Labs: Lab Results  Component Value Date   WBC 13.0* 06/18/2011   HGB 13.6 06/18/2011   HCT 41.3 06/18/2011   MCV 86.8 06/18/2011   PLT 182 06/18/2011    Assessment / Plan: Spontaneous labor, progressing normally  Labor: Progressing normally Preeclampsia:  no signs or symptoms of toxicity Fetal Wellbeing:  Category II Pain Control:  Epidural I/D:  n/a Anticipated MOD:  NSVD  Will start pushing soon, anticipate SVD  BOVARD,Jacquelynn Friend 06/18/2011, 7:39 PM

## 2011-06-18 NOTE — MAU Note (Signed)
Pt in for labor eval, reports ucs increased in severity since 0815.  Denies any bleeding.  Reports passing mucus plug.  + FM.

## 2011-06-18 NOTE — Anesthesia Procedure Notes (Signed)

## 2011-06-18 NOTE — H&P (Signed)
Rachel Wiley is a 36 y.o. female G2P1001 at 38+6 for active labor.  Largely uncomplicated pnc, some tachycardia.  +FM, no LOF, no VB, ctx - increasing in intensity and frequency. Maternal Medical History:  Reason for admission: Reason for admission: contractions.  Fetal activity: Perceived fetal activity is normal.      OB History    Grav Para Term Preterm Abortions TAB SAB Ect Mult Living   2 1 1  0 0 0 0 0 0 1    G1 VAVD, G2 present, h/o Chl/ no abn pap Past Medical History  Diagnosis Date  . Anxiety   . Palpitations   . Other acute reactions to stress   . Headache   . Lipoma of unspecified site   . Tachycardia    Past Surgical History  Procedure Date  . No past surgeries    Family History: family history includes Hypertension in an unspecified family member.  There is no history of Anesthesia problems, and Hypotension, and Malignant hyperthermia, and Pseudochol deficiency, . Social History:  reports that she has never smoked. She does not have any smokeless tobacco history on file. She reports that she does not drink alcohol or use illicit drugs.married Meds PNV All NKDA   Review of Systems  Constitutional: Negative.   HENT: Negative.   Eyes: Negative.   Gastrointestinal: Negative.   Genitourinary: Negative.   Musculoskeletal: Negative.   Skin: Negative.   Neurological: Negative.   Psychiatric/Behavioral: Negative.     Dilation: 4.5 Effacement (%): 90 Station: -2;-1 Exam by:: hk Blood pressure 122/78, pulse 98, temperature 97.6 F (36.4 C), temperature source Oral, resp. rate 18, height 5\' 4"  (1.626 m), weight 79.379 kg (175 lb), SpO2 100.00%. Maternal Exam:  Uterine Assessment: Contraction strength is moderate.  Abdomen: Patient reports no abdominal tenderness. Fundal height is appropriate for gestation.   Fetal presentation: vertex     Physical Exam  Constitutional: She is oriented to person, place, and time. She appears well-developed and well-nourished.    HENT:  Head: Normocephalic and atraumatic.  Neck: Normal range of motion. Neck supple.  Cardiovascular: Normal rate and regular rhythm.   Respiratory: Effort normal and breath sounds normal. No respiratory distress.  GI: Soft. Bowel sounds are normal. She exhibits no distension.  Musculoskeletal: Normal range of motion.  Neurological: She is alert and oriented to person, place, and time.  Skin: Skin is warm and dry.  Psychiatric: She has a normal mood and affect. Her behavior is normal.    Prenatal labs: ABO, Rh:  O+, Ab Scr neg Antibody:  negative Rubella:  immune RPR: Nonreactive (11/02 0000)  HBsAg:   neg HIV: Non-reactive (11/02 0000)  GBS: Negative (04/16 0000)  Hgb 12.9/ Plt 255K/ Hgb electro WNL/ First Tri Screen neg/ AFP neg/ CF declined/glucola 122 EDC 5/12 by 9 wk [place] Nl anat, post plac, female  Assessment/Plan: 35yo G2P1001 at 38+ in active labor anticiopate SVD gbbs negative - no prophylaxis Epidural for comfort   BOVARD,Merlina Marchena 06/18/2011, 7:26 PM

## 2011-06-19 LAB — CBC
MCH: 28.5 pg (ref 26.0–34.0)
MCV: 86.9 fL (ref 78.0–100.0)
Platelets: 154 10*3/uL (ref 150–400)
RBC: 4.21 MIL/uL (ref 3.87–5.11)
RDW: 16.3 % — ABNORMAL HIGH (ref 11.5–15.5)
WBC: 16 10*3/uL — ABNORMAL HIGH (ref 4.0–10.5)

## 2011-06-19 LAB — RPR: RPR Ser Ql: NONREACTIVE

## 2011-06-19 MED ORDER — WITCH HAZEL-GLYCERIN EX PADS: 1.0000 "application " | MEDICATED_PAD | CUTANEOUS | Status: DC | PRN

## 2011-06-19 MED ORDER — LACTATED RINGERS IV SOLN: INTRAVENOUS | Status: DC

## 2011-06-19 MED ORDER — ONDANSETRON HCL 4 MG/2ML IJ SOLN: 4.0000 mg | INTRAMUSCULAR | Status: DC | PRN

## 2011-06-19 MED ORDER — LANOLIN HYDROUS EX OINT: TOPICAL_OINTMENT | CUTANEOUS | Status: DC | PRN

## 2011-06-19 MED ORDER — ZOLPIDEM TARTRATE 5 MG PO TABS: 5.0000 mg | ORAL_TABLET | Freq: Every evening | ORAL | Status: DC | PRN

## 2011-06-19 MED ORDER — COMPLETENATE 29-1 MG PO CHEW
1.0000 | CHEWABLE_TABLET | Freq: Every day | ORAL | Status: DC
  Administered 2011-06-19: 1 via ORAL
  Filled 2011-06-19 (×2): qty 1

## 2011-06-19 MED ORDER — IBUPROFEN 600 MG PO TABS
600.0000 mg | ORAL_TABLET | Freq: Four times a day (QID) | ORAL | Status: DC
  Administered 2011-06-19: 600 mg via ORAL
  Filled 2011-06-19: qty 1

## 2011-06-19 MED ORDER — PRENATAL MULTIVITAMIN CH: 1.0000 | ORAL_TABLET | Freq: Every day | ORAL | Status: DC

## 2011-06-19 MED ORDER — TETANUS-DIPHTH-ACELL PERTUSSIS 5-2.5-18.5 LF-MCG/0.5 IM SUSP
0.5000 mL | Freq: Once | INTRAMUSCULAR | Status: AC
  Administered 2011-06-19: 0.5 mL via INTRAMUSCULAR

## 2011-06-19 MED ORDER — SENNOSIDES-DOCUSATE SODIUM 8.6-50 MG PO TABS
2.0000 | ORAL_TABLET | Freq: Every day | ORAL | Status: DC
  Administered 2011-06-19 (×2): 2 via ORAL

## 2011-06-19 MED ORDER — ONDANSETRON HCL 4 MG PO TABS: 4.0000 mg | ORAL_TABLET | ORAL | Status: DC | PRN

## 2011-06-19 MED ORDER — IBUPROFEN 100 MG/5ML PO SUSP
600.0000 mg | Freq: Four times a day (QID) | ORAL | Status: DC
  Administered 2011-06-19 – 2011-06-20 (×5): 600 mg via ORAL
  Filled 2011-06-19 (×5): qty 30

## 2011-06-19 MED ORDER — DIPHENHYDRAMINE HCL 25 MG PO CAPS: 25.0000 mg | ORAL_CAPSULE | Freq: Four times a day (QID) | ORAL | Status: DC | PRN

## 2011-06-19 MED ORDER — OXYCODONE-ACETAMINOPHEN 5-325 MG PO TABS
1.0000 | ORAL_TABLET | ORAL | Status: DC | PRN
  Administered 2011-06-19 – 2011-06-20 (×3): 1 via ORAL
  Filled 2011-06-19 (×3): qty 1

## 2011-06-19 MED ORDER — SIMETHICONE 80 MG PO CHEW: 80.0000 mg | CHEWABLE_TABLET | ORAL | Status: DC | PRN

## 2011-06-19 MED ORDER — BENZOCAINE-MENTHOL 20-0.5 % EX AERO: 1.0000 "application " | INHALATION_SPRAY | CUTANEOUS | Status: DC | PRN

## 2011-06-19 MED ORDER — DIBUCAINE 1 % RE OINT
1.0000 "application " | TOPICAL_OINTMENT | RECTAL | Status: DC | PRN
  Administered 2011-06-19: 1 via RECTAL
  Filled 2011-06-19: qty 28

## 2011-06-19 MED ORDER — PRENATAL PLUS 27-1 MG PO TABS: 1.0000 | ORAL_TABLET | Freq: Every day | ORAL | Status: DC

## 2011-06-19 NOTE — Progress Notes (Signed)
Post Partum Day 1 Subjective: no complaints, up ad lib, voiding, tolerating PO and nl lochia, pain controlled  Objective: Blood pressure 103/71, pulse 92, temperature 98.6 F (37 C), temperature source Oral, resp. rate 18, height 5\' 4"  (1.626 m), weight 79.379 kg (175 lb), SpO2 96.00%, unknown if currently breastfeeding.  Physical Exam:  General: alert and no distress Lochia: appropriate Uterine Fundus: firm   Basename 06/19/11 0555 06/18/11 1600  HGB 12.0 13.6  HCT 36.6 41.3    Assessment/Plan: Plan for discharge tomorrow, Breastfeeding and Lactation consult   LOS: 1 day   Wiley,Rachel Raven 06/19/2011, 8:34 AM

## 2011-06-19 NOTE — Anesthesia Postprocedure Evaluation (Signed)
  Anesthesia Post-op Note  Patient: Rachel Wiley  Procedure(s) Performed: * No surgery found *  Patient Location: Mother/Baby  Anesthesia Type: Epidural  Level of Consciousness: awake  Airway and Oxygen Therapy: Patient Spontanous Breathing  Post-op Pain: none  Post-op Assessment: Patient's Cardiovascular Status Stable, Respiratory Function Stable, Patent Airway, No signs of Nausea or vomiting, Adequate PO intake, Pain level controlled, No headache, No backache, No residual numbness and No residual motor weakness  Post-op Vital Signs: Reviewed and stable  Complications: No apparent anesthesia complications

## 2011-06-20 MED ORDER — COMPLETENATE 29-1 MG PO CHEW: 1.0000 | CHEWABLE_TABLET | Freq: Every day | ORAL | Status: DC

## 2011-06-20 MED ORDER — IBUPROFEN 100 MG/5ML PO SUSP: 800.0000 mg | Freq: Three times a day (TID) | ORAL | Status: DC | PRN

## 2011-06-20 MED ORDER — OXYCODONE-ACETAMINOPHEN 5-325 MG PO TABS: 1.0000 | ORAL_TABLET | Freq: Four times a day (QID) | ORAL | Status: AC | PRN

## 2011-06-20 NOTE — Progress Notes (Signed)
Post Partum Day 1 Subjective: no complaints, tolerating PO and nl lochia, pain controlled  Objective: Blood pressure 142/72, pulse 106, temperature 98.1 F (36.7 C), temperature source Oral, resp. rate 20, height 5\' 4"  (1.626 m), weight 79.379 kg (175 lb), SpO2 97.00%, unknown if currently breastfeeding.  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  D/c with motrin/percocet/pnv, f/u 6 weeks   LOS: 2 days   Wiley,Rachel Guastella 06/20/2011, 8:34 AM

## 2011-06-20 NOTE — Discharge Summary (Signed)
Obstetric Discharge Summary Reason for Admission: onset of labor Prenatal Procedures: none Intrapartum Procedures: spontaneous vaginal delivery Postpartum Procedures: none Complications-Operative and Postpartum: 2nd degree perineal laceration Hemoglobin  Date Value Range Status  06/19/2011 12.0  12.0-15.0 (g/dL) Final     HCT  Date Value Range Status  06/19/2011 36.6  36.0-46.0 (%) Final    Physical Exam:  General: alert and no distress Lochia: appropriate Uterine Fundus: firm   Discharge Diagnoses: Term Pregnancy-delivered  Discharge Information: Date: 06/20/2011 Activity: pelvic rest Diet: routine Medications: PNV, Ibuprofen and Percocet Condition: stable Instructions: refer to practice specific booklet Discharge to: home to stay as baby patient Follow-up Information    Follow up with BOVARD,Tangie Stay, MD. Schedule an appointment as soon as possible for a visit in 6 weeks.   Contact information:   [address] [facility] [address] [place] [address] [phone]          Newborn Data: Live born female  Birth Weight: 6 lb 6.3 oz (2900 g) APGAR: 8, 9  Home with to stay as baby patient - heart anomaly.  BOVARD,Tarus Briski 06/20/2011, 8:44 AM

## 2011-11-28 ENCOUNTER — Encounter (INDEPENDENT_AMBULATORY_CARE_PROVIDER_SITE_OTHER): Payer: Self-pay | Admitting: Surgery

## 2011-11-28 ENCOUNTER — Encounter (HOSPITAL_BASED_OUTPATIENT_CLINIC_OR_DEPARTMENT_OTHER): Payer: Self-pay | Admitting: *Deleted

## 2011-11-28 ENCOUNTER — Ambulatory Visit (INDEPENDENT_AMBULATORY_CARE_PROVIDER_SITE_OTHER): Payer: BC Managed Care – PPO | Admitting: Surgery

## 2011-11-28 VITALS — BP 124/88 | HR 85 | Temp 97.8°F | Ht 64.0 in | Wt 160.6 lb

## 2011-11-28 DIAGNOSIS — K644 Residual hemorrhoidal skin tags: Secondary | ICD-10-CM | POA: Insufficient documentation

## 2011-11-28 DIAGNOSIS — K602 Anal fissure, unspecified: Secondary | ICD-10-CM

## 2011-11-28 NOTE — Progress Notes (Signed)
Patient ID: Rachel Wiley, female   DOB: 1975/12/01, 36 y.o.   MRN: [id number]  Chief Complaint  Patient presents with  . Pre-op Exam    eval hems  . Rectal Problems    HPI Rachel Wiley is a 36 y.o. female.   HPI This is a very pleasant female referred by Dr. Jackelyn Knife for evaluation of symptomatic hemorrhoids. She has had problems with her hemorrhoids since the birth of her first child in 2008. She mostly has pain and bleeding. She has failed treatment with over-the-counter medications. She currently is pain-free. She still has occasional bleeding. She has no issues with incontinence. She is otherwise healthy and without complaints. Past Medical History  Diagnosis Date  . Anxiety   . Palpitations   . Other acute reactions to stress   . Headache   . Lipoma of unspecified site   . Tachycardia   . SVD (spontaneous vaginal delivery) 06/18/2011    Past Surgical History  Procedure Date  . No past surgeries     Family History  Problem Relation Age of Onset  . Hypertension    . Anesthesia problems Neg Hx   . Hypotension Neg Hx   . Malignant hyperthermia Neg Hx   . Pseudochol deficiency Neg Hx     Social History History  Substance Use Topics  . Smoking status: Never Smoker   . Smokeless tobacco: Not on file  . Alcohol Use: No    No Known Allergies  Current Outpatient Prescriptions  Medication Sig Dispense Refill  . FENUGREEK PO Take by mouth daily.      . Prenatal Vit-Fe Fumarate-FA (MULTIVITAMIN-PRENATAL) 27-0.8 MG TABS Take 1 tablet by mouth daily.          Review of Systems Review of Systems  Constitutional: Negative for fever, chills and unexpected weight change.  HENT: Negative for hearing loss, congestion, sore throat, trouble swallowing and voice change.   Eyes: Negative for visual disturbance.  Respiratory: Negative for cough and wheezing.   Cardiovascular: Positive for palpitations. Negative for chest pain and leg swelling.  Gastrointestinal: Positive for anal  bleeding and rectal pain. Negative for nausea, vomiting, abdominal pain, diarrhea, constipation, blood in stool and abdominal distention.  Genitourinary: Negative for hematuria, vaginal bleeding and difficulty urinating.  Musculoskeletal: Negative for arthralgias.  Skin: Negative for rash and wound.  Neurological: Negative for seizures, syncope and headaches.  Hematological: Negative for adenopathy. Does not bruise/bleed easily.  Psychiatric/Behavioral: Negative for confusion.    Blood pressure 124/88, pulse 85, temperature 97.8 F (36.6 C), temperature source Temporal, height 5\' 4"  (1.626 m), weight 160 lb 9.6 oz (72.848 kg), SpO2 99.00%.  Physical Exam Physical Exam  Constitutional: She is oriented to person, place, and time. She appears well-developed and well-nourished. No distress.  HENT:  Head: Normocephalic and atraumatic.  Right Ear: External ear normal.  Left Ear: External ear normal.  Eyes: Conjunctivae normal are normal. Pupils are equal, round, and reactive to light.  Cardiovascular: Normal rate, regular rhythm, normal heart sounds and intact distal pulses.   No murmur heard. Pulmonary/Chest: Effort normal and breath sounds normal.  Genitourinary:       There is a large anterior skin tag as well as a posterior anal fissure. Sphincter tone is increased. There are no large internal hemorrhoids  Neurological: She is alert and oriented to person, place, and time.  Skin: Skin is warm and dry. No rash noted. She is not diaphoretic. No erythema.  Psychiatric: Her behavior is normal. Judgment normal.  Data Reviewed   Assessment    External hemorrhoid and anal fissure    Plan    Surprisingly, she is more symptomatic from the external hemorrhoids and from the fissure. I explained the diagnosis to her. I believe she does need external hemorrhoidectomy for the skin tag. As she is fairly asymptomatic from the fissure, I will continue conservative measures and will write her  for nitroglycerin ointment. I will schedule her on for hemorrhoidectomy and left the fissure flares. I discussed the risks of surgery which includes but not limited to bleeding, infection, bowel incontinence, et Karie Soda. She understands and wishes to proceed. Likelihood of success is good.       Rachel Wiley A 11/28/2011, 9:43 AM

## 2011-11-30 ENCOUNTER — Encounter (HOSPITAL_BASED_OUTPATIENT_CLINIC_OR_DEPARTMENT_OTHER): Admission: RE | Payer: Self-pay | Source: Ambulatory Visit

## 2011-11-30 ENCOUNTER — Ambulatory Visit (HOSPITAL_BASED_OUTPATIENT_CLINIC_OR_DEPARTMENT_OTHER): Admission: RE | Admit: 2011-11-30 | Payer: BC Managed Care – PPO | Source: Ambulatory Visit | Admitting: Surgery

## 2011-11-30 SURGERY — HEMORRHOIDECTOMY
Anesthesia: General

## 2011-12-17 ENCOUNTER — Encounter (INDEPENDENT_AMBULATORY_CARE_PROVIDER_SITE_OTHER): Payer: BC Managed Care – PPO | Admitting: Surgery

## 2012-05-15 ENCOUNTER — Ambulatory Visit (INDEPENDENT_AMBULATORY_CARE_PROVIDER_SITE_OTHER): Payer: BC Managed Care – PPO | Admitting: Family Medicine

## 2012-05-15 ENCOUNTER — Encounter: Payer: Self-pay | Admitting: Family Medicine

## 2012-05-15 VITALS — BP 104/60 | HR 90 | Temp 99.0°F | Wt 156.0 lb

## 2012-05-15 DIAGNOSIS — J029 Acute pharyngitis, unspecified: Secondary | ICD-10-CM

## 2012-05-15 DIAGNOSIS — L84 Corns and callosities: Secondary | ICD-10-CM

## 2012-05-15 DIAGNOSIS — M722 Plantar fascial fibromatosis: Secondary | ICD-10-CM | POA: Insufficient documentation

## 2012-05-15 DIAGNOSIS — L853 Xerosis cutis: Secondary | ICD-10-CM

## 2012-05-15 DIAGNOSIS — L738 Other specified follicular disorders: Secondary | ICD-10-CM

## 2012-05-15 MED ORDER — AMOXICILLIN 875 MG PO TABS: 875.0000 mg | ORAL_TABLET | Freq: Two times a day (BID) | ORAL | Status: DC

## 2012-05-15 NOTE — Progress Notes (Signed)
  Subjective:     Rachel Wiley is a 37 y.o. female who presents for evaluation of symptoms of a URI. Symptoms include bilateral ear pressure/pain, coryza, low grade fever, non productive cough, post nasal drip and sore throat. Onset of symptoms was 1 day ago, and has been gradually worsening since that time. Treatment to date: none.  Pt also c/o pain in heels   The following portions of the patient's history were reviewed and updated as appropriate: allergies, current medications, past family history, past medical history, past social history, past surgical history and problem list.  Review of Systems Pertinent items are noted in HPI.   Objective:    BP 104/60  Pulse 90  Temp(Src) 99 F (37.2 C) (Oral)  Wt 156 lb (70.761 kg)  BMI 26.76 kg/m2  SpO2 99% General appearance: alert, cooperative, appears stated age and no distress Ears: normal TM's and external ear canals both ears Nose: Nares normal. Septum midline. Mucosa normal. No drainage or sinus tenderness. Throat: abnormal findings: mild oropharyngeal erythema and PND Neck: moderate anterior cervical adenopathy, supple, symmetrical, trachea midline and thyroid not enlarged, symmetric, no tenderness/mass/nodules Lungs: clear to auscultation bilaterally Heart: S1, S2 normal  Skin-- dry skin on elbows and forearm Ext-- pain in both heels  R >L  , no swelling , no errythema .  + callus build up on both feet Assessment:    nasopharyngitis   Plan:    Suggested symptomatic OTC remedies. Amoxicillin per orders. Nasal steroids per orders. Follow up as needed.

## 2012-05-15 NOTE — Assessment & Plan Note (Signed)
Offered podiatry She will try pedicure first

## 2012-05-15 NOTE — Assessment & Plan Note (Signed)
Stretch Gel inserts nsaid prn Podiatry if no better

## 2012-05-15 NOTE — Patient Instructions (Signed)

## 2012-07-10 ENCOUNTER — Telehealth: Payer: Self-pay | Admitting: Cardiovascular Disease

## 2012-07-10 MED ORDER — PROPRANOLOL HCL 20 MG PO TABS: ORAL_TABLET | ORAL | Status: DC

## 2012-07-10 NOTE — Telephone Encounter (Signed)
Pt called because yesterday pt had a heart rate of 103 beats/minute BP 137/95 she was having some SOB. Pt took a Inderal 20 mg and she felt better. Today pt's BP is 130/86 pulse 90 beats/minute. Pt was seen by Dr. Eden Emms and had a stress  on May 20/ 2012. Also was seen by Norma Fredrickson on 03/15/11. Pt would like to have a refill for Inderal 20 mg just incase if she need to take it again, because the medication she has is going to expired tomorrow.

## 2012-07-10 NOTE — Telephone Encounter (Signed)
New Prob     Pt requesting a prescription for IMDERAL. Please call.

## 2012-07-10 NOTE — Telephone Encounter (Signed)
Dr. Eden Emms aware of pt's symptoms, MD states is fine to refill Inderal 20 mg as needed, and  to make a F/U appointment. Pt has an appointment with Dr. Eden Emms on July 31 st at 4:15 Pm. Pt aware.

## 2012-08-04 ENCOUNTER — Ambulatory Visit (INDEPENDENT_AMBULATORY_CARE_PROVIDER_SITE_OTHER): Payer: BC Managed Care – PPO | Admitting: Family Medicine

## 2012-08-04 ENCOUNTER — Encounter: Payer: Self-pay | Admitting: Family Medicine

## 2012-08-04 VITALS — BP 110/80 | HR 91 | Temp 99.5°F | Wt 155.0 lb

## 2012-08-04 DIAGNOSIS — J069 Acute upper respiratory infection, unspecified: Secondary | ICD-10-CM

## 2012-08-04 MED ORDER — PROPRANOLOL HCL 20 MG PO TABS: ORAL_TABLET | ORAL | Status: DC

## 2012-08-04 NOTE — Patient Instructions (Signed)

## 2012-08-04 NOTE — Progress Notes (Deleted)
  Subjective:     Rachel Wiley is a 37 y.o. female who presents for evaluation of symptoms of a URI. Symptoms include {uri sx:15453}. Onset of symptoms was {1-10:13787} {time; units:18646::"days"} ago, and has been {clinical course:17::"gradually worsening"} since that time. Treatment to date: {URI tx:14268}.  {Common ambulatory SmartLinks:19316}  Review of Systems {ros; complete:30496}   Objective:    {exam; complete:17964}   Assessment:    {uri dx:5273::"viral upper respiratory illness"}   Plan:    {plan; uri:14054::"Discussed diagnosis and treatment of URI.","Suggested symptomatic OTC remedies.","Nasal saline spray for congestion.","Follow up as needed."}  Subjective:     [name] is a 37 y.o. female who presents for evaluation of symptoms of a URI. Symptoms include bilateral ear pressure/pain, congestion, nasal congestion, no  fever and post nasal drip. Onset of symptoms was 2 days ago, and has been unchanged since that time. Treatment to date: none.  The following portions of the patient's history were reviewed and updated as appropriate: allergies, current medications, past family history, past medical history, past social history, past surgical history and problem list.  Review of Systems Pertinent items are noted in HPI.   Objective:    BP 110/80  Pulse 91  Temp(Src) 99.5 F (37.5 C) (Oral)  Wt 155 lb (70.308 kg)  BMI 26.59 kg/m2  SpO2 95% General appearance: alert, cooperative, appears stated age and no distress Ears: fluids b/l Nose: clear discharge, mild congestion, turbinates red, swollen, no sinus tenderness Throat: lips, mucosa, and tongue normal; teeth and gums normal Neck: no adenopathy, supple, symmetrical, trachea midline and thyroid not enlarged, symmetric, no tenderness/mass/nodules Lungs: clear to auscultation bilaterally Heart: S1, S2 normal   Assessment:    viral upper respiratory illness   Plan:    Discussed diagnosis and treatment of  URI. Suggested symptomatic OTC remedies. Nasal steroids per orders. Follow up as needed. f/u prn

## 2012-08-04 NOTE — Progress Notes (Signed)
  Subjective:     Rachel Wiley is a 37 y.o. female who presents for evaluation of symptoms of a URI. Symptoms include bilateral ear pressure/pain, congestion, nasal congestion, no  fever, post nasal drip and sore throat. Onset of symptoms was 2 days ago, and has been gradually worsening since that time. Treatment to date: none.  The following portions of the patient's history were reviewed and updated as appropriate: allergies, current medications, past family history, past medical history, past social history, past surgical history and problem list.  Review of Systems Pertinent items are noted in HPI.   Objective:    BP 110/80  Pulse 91  Temp(Src) 99.5 F (37.5 C) (Oral)  Wt 155 lb (70.308 kg)  BMI 26.59 kg/m2  SpO2 95% General appearance: alert, cooperative, appears stated age and no distress Ears: fluid b/l Nose: clear discharge, mild congestion, no sinus tenderness Throat: lips, mucosa, and tongue normal; teeth and gums normal Neck: mild anterior cervical adenopathy, supple, symmetrical, trachea midline and thyroid not enlarged, symmetric, no tenderness/mass/nodules Lungs: clear to auscultation bilaterally Heart: S1, S2 normal   Assessment:    viral upper respiratory illness   Plan:    Discussed diagnosis and treatment of URI. Suggested symptomatic OTC remedies. Nasal saline spray for congestion. Nasal steroids per orders. Follow up as needed.

## 2012-09-11 ENCOUNTER — Encounter: Payer: Self-pay | Admitting: Cardiovascular Disease

## 2012-09-11 ENCOUNTER — Ambulatory Visit (INDEPENDENT_AMBULATORY_CARE_PROVIDER_SITE_OTHER): Payer: BC Managed Care – PPO | Admitting: Cardiovascular Disease

## 2012-09-11 VITALS — BP 118/80 | HR 77 | Ht 64.0 in | Wt 152.0 lb

## 2012-09-11 DIAGNOSIS — R002 Palpitations: Secondary | ICD-10-CM

## 2012-09-11 NOTE — Progress Notes (Signed)
Patient ID: Rachel Wiley, female   DOB: Apr 25, 1975, 37 y.o.   MRN: [id number] 37 yo referred by Dr Laury Axon for palpitatins and dyspnea in 2012 . Improved with Inderal 40mg  but this was stopped last year. Last couple of months occasional episodes of palpitations and noticing her HR. Seems to have a propensity for exaggerated HR response to exercise. Reviewed labs by Dr Laury Axon and normal with no anemia or thyroid disease. Some stress with living arrangements as her and her husband have been back and forth to [place]. No SSCP and mild dyspnea with walking. No stimulants and only 2 cups of tea /day. Has not had SSCP, presyncope, edema. Sometimes headaches have made her pulse faster.  Echo 5/12 Normal EF 55-60%  Has not needed beta blocker in long time  2nd son born about 1.5 years ago with hemi truncus and has had multiple surgeries at [facility] Finally doing better.   ROS: Denies fever, malais, weight loss, blurry vision, decreased visual acuity, cough, sputum, SOB, hemoptysis, pleuritic pain, palpitaitons, heartburn, abdominal pain, melena, lower extremity edema, claudication, or rash.  All other systems reviewed and negative  General: Affect appropriate Healthy:  appears stated age HEENT: normal Neck supple with no adenopathy JVP normal no bruits no thyromegaly Lungs clear with no wheezing and good diaphragmatic motion Heart:  S1/S2 no murmur, no rub, gallop or click PMI normal Abdomen: benighn, BS positve, no tenderness, no AAA no bruit.  No HSM or HJR Distal pulses intact with no bruits No edema Neuro non-focal Skin warm and dry No muscular weakness   Current Outpatient Prescriptions  Medication Sig Dispense Refill  . propranolol (INDERAL) 20 MG tablet Pt to take one tablet once a day as needed.  30 tablet  2   No current facility-administered medications for this visit.    Allergies  Review of patient's allergies indicates no known allergies.  Electrocardiogram:  NSR rate 77 normal ECG    Assessment and Plan

## 2012-09-11 NOTE — Assessment & Plan Note (Signed)
Improved continue PRN inderal

## 2012-09-11 NOTE — Patient Instructions (Addendum)
Your physician wants you to follow-up in: YEAR WITH DR NISHAN  You will receive a reminder letter in the mail two months in advance. If you don't receive a letter, please call our office to schedule the follow-up appointment.  Your physician recommends that you continue on your current medications as directed. Please refer to the Current Medication list given to you today. 

## 2013-02-11 ENCOUNTER — Encounter: Payer: Self-pay | Admitting: Internal Medicine

## 2013-02-11 ENCOUNTER — Ambulatory Visit (INDEPENDENT_AMBULATORY_CARE_PROVIDER_SITE_OTHER): Payer: BC Managed Care – PPO | Admitting: Internal Medicine

## 2013-02-11 VITALS — BP 116/79 | HR 113 | Temp 98.7°F | Wt 149.2 lb

## 2013-02-11 DIAGNOSIS — S098XXS Other specified injuries of head, sequela: Secondary | ICD-10-CM

## 2013-02-11 DIAGNOSIS — J069 Acute upper respiratory infection, unspecified: Secondary | ICD-10-CM

## 2013-02-11 DIAGNOSIS — R000 Tachycardia, unspecified: Secondary | ICD-10-CM

## 2013-02-11 DIAGNOSIS — IMO0001 Reserved for inherently not codable concepts without codable children: Secondary | ICD-10-CM

## 2013-02-11 DIAGNOSIS — Z23 Encounter for immunization: Secondary | ICD-10-CM

## 2013-02-11 LAB — CBC WITH DIFFERENTIAL/PLATELET
Basophils Relative: 0.4 % (ref 0.0–3.0)
Eosinophils Absolute: 0.2 10*3/uL (ref 0.0–0.7)
Hemoglobin: 13.2 g/dL (ref 12.0–15.0)
Lymphocytes Relative: 27.6 % (ref 12.0–46.0)
MCHC: 33.9 g/dL (ref 30.0–36.0)
Neutro Abs: 3.1 10*3/uL (ref 1.4–7.7)
RBC: 4.74 Mil/uL (ref 3.87–5.11)

## 2013-02-11 NOTE — Progress Notes (Signed)
Pre visit review using our clinic review tool, if applicable. No additional management support is needed unless otherwise documented below in the visit note. 

## 2013-02-11 NOTE — Patient Instructions (Signed)

## 2013-02-11 NOTE — Progress Notes (Signed)
Subjective:    Patient ID: Rachel Wiley, female    DOB: 1975-10-15, 37 y.o.   MRN: [id number]  HPI  She describes pain and tenderness to direct pressure over the crown of her head over the last month. Several months ago a glass tumbler fell from a top shelf striking her in this area. She had pain up to a level IV for several days. The injury was not associated with loss of consciousness or other symptoms or signs.No treatment was necessary.      Review of Systems Since the event she has had no headaches, blurred vision, double vision, or loss of vision. There's been no tinnitus or hearing loss. She notices no gait dysfunction. She has no numbness, tingling, weakness in extremities.  A recent event is  some head congestion and pressure particularly over the right eye. This is not associated with fever, chills, or sweats. She has no purulent drainage.  She is concerned about her thyroid function as she has had tachycardia in the past and palpitations. She also inquired about her glucose status.  In reviewing her chart her glucoses have been normal. There was no hyperglycemia with pregnancy by history.  She has had leukocytosis in the past.  She is under stress as her 27-month-old does have congenital heart disease apparently.    Objective:   Physical Exam Gen.: Healthy and well-nourished in appearance. Alert, appropriate and cooperative throughout exam.Appears younger than stated age  Head: Normocephalic without obvious abnormalities. Boss over the posterior cranial to palpation. Eyes: No corneal or conjunctival inflammation noted. Pupils equal round reactive to light and accommodation. Extraocular motion intact. Field of vision is normal. Vision grossly normal without lenses Ears: External  ear exam reveals no significant lesions or deformities. Canals clear .TMs normal. Hearing is grossly normal bilaterally. Tuning fork exam is normal. Nose: External nasal exam reveals no deformity or  inflammation. Nasal mucosa are pink and moist. No lesions or exudates noted.   Mouth: Oral mucosa and oropharynx reveal no lesions or exudates. Teeth in good repair. No tongue deviation. Neck: No deformities, masses, or tenderness noted. Range of motion normal. Lungs: Normal respiratory effort; chest expands symmetrically. Lungs are clear to auscultation without rales, wheezes, or increased work of breathing. Heart: Normal rate and rhythm. Normal S1 and S2. No gallop, click, or rub. S4 w/o murmur. Pulse rate 90 on recheck.                                Musculoskeletal/extremities:  No clubbing, cyanosis, edema, or significant extremity  deformity noted. Tone & strength normal. Neurologic: Alert and oriented x3. Deep tendon reflexes symmetrical and normal.  Gait normal  including heel & toe walking . Rhomberg & finger to nose normal.  No cranial nerve deficit. Skin: Intact without suspicious lesions or rashes. Lymph: No cervical, axillary lymphadenopathy present. Psych: Mood and affect are normal. Normally interactive                                                                                        Assessment & Plan:  #1  blunt trauma with residual tenderness locally. Very small boss present  #2 viral upper respiratory tract symptoms  Plan: see orders

## 2013-02-11 NOTE — Assessment & Plan Note (Signed)
TSH 

## 2013-04-21 ENCOUNTER — Encounter: Payer: Self-pay | Admitting: Family Medicine

## 2013-04-21 ENCOUNTER — Ambulatory Visit (INDEPENDENT_AMBULATORY_CARE_PROVIDER_SITE_OTHER): Payer: BC Managed Care – PPO | Admitting: Family Medicine

## 2013-04-21 VITALS — BP 112/70 | HR 102 | Temp 98.0°F | Wt 146.2 lb

## 2013-04-21 DIAGNOSIS — G5600 Carpal tunnel syndrome, unspecified upper limb: Secondary | ICD-10-CM

## 2013-04-21 DIAGNOSIS — R51 Headache: Secondary | ICD-10-CM

## 2013-04-21 NOTE — Progress Notes (Signed)
  Subjective:    Rachel Wiley is a 38 y.o. female who presents for evaluation of headache. Symptoms began about 1 year ago. Generally, the headaches last about a few days and occur every day. The headaches do not seem to be related to any time of the day. The headaches are usually dull and are located in top of scalp.  The patient rates her most severe headaches a 4 on a scale from 1 to 10. Recently, the headaches have been stable. Work attendance or other daily activities are not affected by the headaches. Precipitating factors include: head trauma. The headaches are usually not preceded by an aura. Associated neurologic symptoms: dizziness, numbness of extremities and vision problems. The patient denies depression, loss of balance, muscle weakness, speech difficulties, vomiting in the early morning and worsening school/work performance. Home treatment has included acetaminophen, resting and sleeping with little improvement. Other history includes: nothing pertinent. Family history includes no known family members with significant headaches.  The following portions of the patient's history were reviewed and updated as appropriate: allergies, current medications, past family history, past medical history, past social history, past surgical history and problem list.  Review of Systems Pertinent items are noted in HPI.    Objective:    BP 112/70  Pulse 102  Temp(Src) 98 F (36.7 C) (Oral)  Wt 146 lb 3.2 oz (66.316 kg)  SpO2 98%  LMP 04/15/2013 General appearance: alert, cooperative, appears stated age and no distress Neck: no adenopathy, no carotid bruit, no JVD, supple, symmetrical, trachea midline and thyroid not enlarged, symmetric, no tenderness/mass/nodules Back: symmetric, no curvature. ROM normal. No CVA tenderness. Lungs: clear to auscultation bilaterally Heart: regular rate and rhythm, S1, S2 normal, no murmur, click, rub or gallop Extremities: extremities normal, atraumatic, no cyanosis  or edema Skin: Skin color, texture, turgor normal. No rashes or lesions Neurologic: Alert and oriented X 3, normal strength and tone. Normal symmetric reflexes. Normal coordination and gait    Assessment:    Post-trauma headache    Plan:    Lie in darkened room and apply cold packs as needed for pain. Neurodiagnostic workup: MRI to r/o signs of trauma.   1. Headache(784.0)  - MR Brain Wo Contrast; Future  2. CTS (carpal tunnel syndrome) Wrist splint--- hand surgeon if no better

## 2013-04-21 NOTE — Progress Notes (Signed)
Pre visit review using our clinic review tool, if applicable. No additional management support is needed unless otherwise documented below in the visit note. 

## 2013-04-21 NOTE — Patient Instructions (Signed)
Head Injury, Adult  You have received a head injury. It does not appear serious at this time. Headaches and vomiting are common following head injury. It should be easy to awaken from sleeping. Sometimes it is necessary for you to stay in the emergency department for a while for observation. Sometimes admission to the hospital may be needed. After injuries such as yours, most problems occur within the first 24 hours, but side effects may occur up to 7 10 days after the injury. It is important for you to carefully monitor your condition and contact your health care provider or seek immediate medical care if there is a change in your condition.  WHAT ARE THE TYPES OF HEAD INJURIES?  Head injuries can be as minor as a bump. Some head injuries can be more severe. More severe head injuries include:  · A jarring injury to the brain (concussion).  · A bruise of the brain (contusion). This mean there is bleeding in the brain that can cause swelling.  · A cracked skull (skull fracture).  · Bleeding in the brain that collects, clots, and forms a bump (hematoma).  WHAT CAUSES A HEAD INJURY?  A serious head injury is most likely to happen to someone who is in a car wreck and is not wearing a seat belt. Other causes of major head injuries include bicycle or motorcycle accidents, sports injuries, and falls.  HOW ARE HEAD INJURIES DIAGNOSED?  A complete history of the event leading to the injury and your current symptoms will be helpful in diagnosing head injuries. Many times, pictures of the brain, such as CT or MRI are needed to see the extent of the injury. Often, an overnight hospital stay is necessary for observation.   WHEN SHOULD I SEEK IMMEDIATE MEDICAL CARE?   You should get help right away if:  · You have confusion or drowsiness.  · You feel sick to your stomach (nauseous) or have continued, forceful vomiting.  · You have dizziness or unsteadiness that is getting worse.  · You have severe, continued headaches not  relieved by medicine. Only take over-the-counter or prescription medicines for pain, fever, or discomfort as directed by your health care provider.  · You do not have normal function of the arms or legs or are unable to walk.  · You notice changes in the black spots in the center of the colored part of your eye (pupil).  · You have a clear or bloody fluid coming from your nose or ears.  · You have a loss of vision.  During the next 24 hours after the injury, you must stay with someone who can watch you for the warning signs. This person should contact local emergency services (911 in the [place] if you have seizures, you become unconscious, or you are unable to wake up.  HOW CAN I PREVENT A HEAD INJURY IN THE FUTURE?  The most important factor for preventing major head injuries is avoiding motor vehicle accidents.  To minimize the potential for damage to your head, it is crucial to wear seat belts while riding in motor vehicles. Wearing helmets while bike riding and playing collision sports (like football) is also helpful. Also, avoiding dangerous activities around the house will further help reduce your risk of head injury.   WHEN CAN I RETURN TO NORMAL ACTIVITIES AND ATHLETICS?  You should be reevaluated by your health care provider before returning to these activities. If you have any of the following symptoms, you should not return   to activities or contact sports until 1 week after the symptoms have stopped:  · Persistent headache.  · Dizziness or vertigo.  · Poor attention and concentration.  · Confusion.  · Memory problems.  · Nausea or vomiting.  · Fatigue or tire easily.  · Irritability.  · Intolerant of bright lights or loud noises.  · Anxiety or depression.  · Disturbed sleep.  MAKE SURE YOU:   · Understand these instructions.  · Will watch your condition.  · Will get help right away if you are not doing well or get worse.  Document Released: 01/29/2005 Document Revised: 11/19/2012 Document Reviewed:  10/06/2012  [employer]® Patient Information ©2014 [employer].

## 2013-04-22 ENCOUNTER — Telehealth: Payer: Self-pay | Admitting: Family Medicine

## 2013-04-22 MED ORDER — ALPRAZOLAM 0.25 MG PO TABS: ORAL_TABLET | ORAL | Status: DC

## 2013-04-22 NOTE — Telephone Encounter (Signed)
Patient called to see if she can get her MRI done this Saturday. Please advise

## 2013-04-22 NOTE — Telephone Encounter (Signed)
Please advise      KP 

## 2013-04-22 NOTE — Telephone Encounter (Signed)
Pt scheduled @ [facility] on 04/27/13 @ 8pm. Patient states she is anxious about this procedure and would like some medication to take prior to it. Pt uses [facility].

## 2013-04-22 NOTE — Telephone Encounter (Signed)
Rx faxed to [facility], unable to leave VM for the pt because the VM is not setup.     KP

## 2013-04-22 NOTE — Telephone Encounter (Signed)
Xanax 0.25mg   #10  1 tab 30 min before procedure and bring med with you to procedure

## 2013-04-27 ENCOUNTER — Ambulatory Visit (HOSPITAL_COMMUNITY)
Admission: RE | Admit: 2013-04-27 | Discharge: 2013-04-27 | Disposition: A | Discharge status: Home / Self Care | Payer: BC Managed Care – PPO | Source: Ambulatory Visit | Attending: Family Medicine | Admitting: Family Medicine

## 2013-04-27 DIAGNOSIS — R51 Headache: Secondary | ICD-10-CM | POA: Insufficient documentation

## 2013-04-27 DIAGNOSIS — J3489 Other specified disorders of nose and nasal sinuses: Secondary | ICD-10-CM | POA: Insufficient documentation

## 2013-04-27 DIAGNOSIS — I10 Essential (primary) hypertension: Secondary | ICD-10-CM | POA: Insufficient documentation

## 2013-04-28 ENCOUNTER — Telehealth: Payer: Self-pay

## 2013-04-28 NOTE — Telephone Encounter (Signed)
LITTLE CYST IN SINUS-- NOTHING NEEDS TO BE DONE

## 2013-04-28 NOTE — Telephone Encounter (Signed)
Patient reviewed her MRI and wants to know what . Right maxillary sinus retention cyst means and what needs to be done? Please advise     KP

## 2013-04-28 NOTE — Telephone Encounter (Signed)
Patient aware of recommendations and will call if no improvement of symptoms.     KP

## 2013-04-28 NOTE — Telephone Encounter (Signed)
REFERAL CAN BE DONE IF SYMPTOMS PERSIST

## 2013-05-07 ENCOUNTER — Encounter: Payer: BC Managed Care – PPO | Admitting: Family Medicine

## 2013-06-12 ENCOUNTER — Encounter: Payer: BC Managed Care – PPO | Admitting: Family Medicine

## 2013-10-14 ENCOUNTER — Ambulatory Visit: Payer: BC Managed Care – PPO | Admitting: Cardiovascular Disease

## 2013-10-20 ENCOUNTER — Encounter: Payer: Self-pay | Admitting: Family Medicine

## 2013-10-20 ENCOUNTER — Ambulatory Visit (INDEPENDENT_AMBULATORY_CARE_PROVIDER_SITE_OTHER): Payer: BC Managed Care – PPO | Admitting: Family Medicine

## 2013-10-20 ENCOUNTER — Ambulatory Visit: Payer: BC Managed Care – PPO | Admitting: Cardiovascular Disease

## 2013-10-20 VITALS — HR 76 | Temp 98.1°F | Ht 64.0 in | Wt 152.6 lb

## 2013-10-20 DIAGNOSIS — Z23 Encounter for immunization: Secondary | ICD-10-CM

## 2013-10-20 DIAGNOSIS — Z0000 Encounter for general adult medical examination without abnormal findings: Secondary | ICD-10-CM

## 2013-10-20 LAB — BASIC METABOLIC PANEL
BUN: 11 mg/dL (ref 6–23)
CALCIUM: 9.2 mg/dL (ref 8.4–10.5)
CO2: 25 meq/L (ref 19–32)
CREATININE: 0.9 mg/dL (ref 0.4–1.2)
Chloride: 104 mEq/L (ref 96–112)
GFR: 76.5 mL/min (ref >60.00)
Glucose, Bld: 84 mg/dL (ref 70–99)
Potassium: 3.8 mEq/L (ref 3.5–5.1)
SODIUM: 138 meq/L (ref 135–145)

## 2013-10-20 LAB — CBC WITH DIFFERENTIAL/PLATELET
BASOS PCT: 0.3 % (ref 0.0–3.0)
Basophils Absolute: 0 10*3/uL (ref 0.0–0.1)
EOS ABS: 0.3 10*3/uL (ref 0.0–0.7)
EOS PCT: 3.6 % (ref 0.0–5.0)
HCT: 40.3 % (ref 36.0–46.0)
HEMOGLOBIN: 13.2 g/dL (ref 12.0–15.0)
LYMPHS PCT: 29.8 % (ref 12.0–46.0)
Lymphs Abs: 2.2 10*3/uL (ref 0.7–4.0)
MCHC: 32.8 g/dL (ref 30.0–36.0)
MCV: 84.2 fl (ref 78.0–100.0)
MONOS PCT: 6.1 % (ref 3.0–12.0)
Monocytes Absolute: 0.5 10*3/uL (ref 0.1–1.0)
NEUTROS ABS: 4.5 10*3/uL (ref 1.4–7.7)
NEUTROS PCT: 60.2 % (ref 43.0–77.0)
Platelets: 181 10*3/uL (ref 150.0–400.0)
RBC: 4.78 Mil/uL (ref 3.87–5.11)
RDW: 15.9 % — ABNORMAL HIGH (ref 11.5–15.5)
WBC: 7.5 10*3/uL (ref 4.0–10.5)

## 2013-10-20 LAB — HEPATIC FUNCTION PANEL
ALBUMIN: 4.1 g/dL (ref 3.5–5.2)
ALK PHOS: 46 U/L (ref 39–117)
ALT: 12 U/L (ref 0–35)
AST: 17 U/L (ref 0–37)
BILIRUBIN DIRECT: 0 mg/dL (ref 0.0–0.3)
TOTAL PROTEIN: 8 g/dL (ref 6.0–8.3)
Total Bilirubin: 0.5 mg/dL (ref 0.2–1.2)

## 2013-10-20 LAB — LIPID PANEL
CHOL/HDL RATIO: 5
Cholesterol: 163 mg/dL (ref 0–200)
HDL: 35.2 mg/dL — AB (ref >39.00)
LDL Cholesterol: 101 mg/dL — ABNORMAL HIGH (ref 0–99)
NONHDL: 127.8
Triglycerides: 135 mg/dL (ref 0.0–149.0)
VLDL: 27 mg/dL (ref 0.0–40.0)

## 2013-10-20 LAB — TSH: TSH: 0.83 u[IU]/mL (ref 0.35–4.50)

## 2013-10-20 NOTE — Patient Instructions (Signed)
Preventive Care for Adults A healthy lifestyle and preventive care can promote health and wellness. Preventive health guidelines for women include the following key practices.  A routine yearly physical is a good way to check with your health care provider about your health and preventive screening. It is a chance to share any concerns and updates on your health and to receive a thorough exam.  Visit your dentist for a routine exam and preventive care every 6 months. Brush your teeth twice a day and floss once a day. Good oral hygiene prevents tooth decay and gum disease.  The frequency of eye exams is based on your age, health, family medical history, use of contact lenses, and other factors. Follow your health care provider's recommendations for frequency of eye exams.  Eat a healthy diet. Foods like vegetables, fruits, whole grains, low-fat dairy products, and lean protein foods contain the nutrients you need without too many calories. Decrease your intake of foods high in solid fats, added sugars, and salt. Eat the right amount of calories for you.Get information about a proper diet from your health care provider, if necessary.  Regular physical exercise is one of the most important things you can do for your health. Most adults should get at least 150 minutes of moderate-intensity exercise (any activity that increases your heart rate and causes you to sweat) each week. In addition, most adults need muscle-strengthening exercises on 2 or more days a week.  Maintain a healthy weight. The body mass index (BMI) is a screening tool to identify possible weight problems. It provides an estimate of body fat based on height and weight. Your health care provider can find your BMI and can help you achieve or maintain a healthy weight.For adults 20 years and older:  A BMI below 18.5 is considered underweight.  A BMI of 18.5 to 24.9 is normal.  A BMI of 25 to 29.9 is considered overweight.  A BMI of  30 and above is considered obese.  Maintain normal blood lipids and cholesterol levels by exercising and minimizing your intake of saturated fat. Eat a balanced diet with plenty of fruit and vegetables. Blood tests for lipids and cholesterol should begin at age 76 and be repeated every 5 years. If your lipid or cholesterol levels are high, you are over 50, or you are at high risk for heart disease, you may need your cholesterol levels checked more frequently.Ongoing high lipid and cholesterol levels should be treated with medicines if diet and exercise are not working.  If you smoke, find out from your health care provider how to quit. If you do not use tobacco, do not start.  Lung cancer screening is recommended for adults aged 22-80 years who are at high risk for developing lung cancer because of a history of smoking. A yearly low-dose CT scan of the lungs is recommended for people who have at least a 30-pack-year history of smoking and are a current smoker or have quit within the past 15 years. A pack year of smoking is smoking an average of 1 pack of cigarettes a day for 1 year (for example: 1 pack a day for 30 years or 2 packs a day for 15 years). Yearly screening should continue until the smoker has stopped smoking for at least 15 years. Yearly screening should be stopped for people who develop a health problem that would prevent them from having lung cancer treatment.  If you are pregnant, do not drink alcohol. If you are breastfeeding,  be very cautious about drinking alcohol. If you are not pregnant and choose to drink alcohol, do not have more than 1 drink per day. One drink is considered to be 12 ounces (355 mL) of beer, 5 ounces (148 mL) of wine, or 1.5 ounces (44 mL) of liquor.  Avoid use of street drugs. Do not share needles with anyone. Ask for help if you need support or instructions about stopping the use of drugs.  High blood pressure causes heart disease and increases the risk of  stroke. Your blood pressure should be checked at least every 1 to 2 years. Ongoing high blood pressure should be treated with medicines if weight loss and exercise do not work.  If you are 3-86 years old, ask your health care provider if you should take aspirin to prevent strokes.  Diabetes screening involves taking a blood sample to check your fasting blood sugar level. This should be done once every 3 years, after age 67, if you are within normal weight and without risk factors for diabetes. Testing should be considered at a younger age or be carried out more frequently if you are overweight and have at least 1 risk factor for diabetes.  Breast cancer screening is essential preventive care for women. You should practice "breast self-awareness." This means understanding the normal appearance and feel of your breasts and may include breast self-examination. Any changes detected, no matter how small, should be reported to a health care provider. Women in their 8s and 30s should have a clinical breast exam (CBE) by a health care provider as part of a regular health exam every 1 to 3 years. After age 70, women should have a CBE every year. Starting at age 25, women should consider having a mammogram (breast X-ray test) every year. Women who have a family history of breast cancer should talk to their health care provider about genetic screening. Women at a high risk of breast cancer should talk to their health care providers about having an MRI and a mammogram every year.  Breast cancer gene (BRCA)-related cancer risk assessment is recommended for women who have family members with BRCA-related cancers. BRCA-related cancers include breast, ovarian, tubal, and peritoneal cancers. Having family members with these cancers may be associated with an increased risk for harmful changes (mutations) in the breast cancer genes BRCA1 and BRCA2. Results of the assessment will determine the need for genetic counseling and  BRCA1 and BRCA2 testing.  Routine pelvic exams to screen for cancer are no longer recommended for nonpregnant women who are considered low risk for cancer of the pelvic organs (ovaries, uterus, and vagina) and who do not have symptoms. Ask your health care provider if a screening pelvic exam is right for you.  If you have had past treatment for cervical cancer or a condition that could lead to cancer, you need Pap tests and screening for cancer for at least 20 years after your treatment. If Pap tests have been discontinued, your risk factors (such as having a new sexual partner) need to be reassessed to determine if screening should be resumed. Some women have medical problems that increase the chance of getting cervical cancer. In these cases, your health care provider may recommend more frequent screening and Pap tests.  The HPV test is an additional test that may be used for cervical cancer screening. The HPV test looks for the virus that can cause the cell changes on the cervix. The cells collected during the Pap test can be  tested for HPV. The HPV test could be used to screen women aged 59 years and older, and should be used in women of any age who have unclear Pap test results. After the age of 23, women should have HPV testing at the same frequency as a Pap test.  Colorectal cancer can be detected and often prevented. Most routine colorectal cancer screening begins at the age of 70 years and continues through age 25 years. However, your health care provider may recommend screening at an earlier age if you have risk factors for colon cancer. On a yearly basis, your health care provider may provide home test kits to check for hidden blood in the stool. Use of a small camera at the end of a tube, to directly examine the colon (sigmoidoscopy or colonoscopy), can detect the earliest forms of colorectal cancer. Talk to your health care provider about this at age 6, when routine screening begins. Direct  exam of the colon should be repeated every 5-10 years through age 20 years, unless early forms of pre-cancerous polyps or small growths are found.  People who are at an increased risk for hepatitis B should be screened for this virus. You are considered at high risk for hepatitis B if:  You were born in a country where hepatitis B occurs often. Talk with your health care provider about which countries are considered high risk.  Your parents were born in a high-risk country and you have not received a shot to protect against hepatitis B (hepatitis B vaccine).  You have HIV or AIDS.  You use needles to inject street drugs.  You live with, or have sex with, someone who has hepatitis B.  You get hemodialysis treatment.  You take certain medicines for conditions like cancer, organ transplantation, and autoimmune conditions.  Hepatitis C blood testing is recommended for all people born from [birth year] through 1965 and any individual with known risks for hepatitis C.  Practice safe sex. Use condoms and avoid high-risk sexual practices to reduce the spread of sexually transmitted infections (STIs). STIs include gonorrhea, chlamydia, syphilis, trichomonas, herpes, HPV, and human immunodeficiency virus (HIV). Herpes, HIV, and HPV are viral illnesses that have no cure. They can result in disability, cancer, and death.  You should be screened for sexually transmitted illnesses (STIs) including gonorrhea and chlamydia if:  You are sexually active and are younger than 24 years.  You are older than 24 years and your health care provider tells you that you are at risk for this type of infection.  Your sexual activity has changed since you were last screened and you are at an increased risk for chlamydia or gonorrhea. Ask your health care provider if you are at risk.  If you are at risk of being infected with HIV, it is recommended that you take a prescription medicine daily to prevent HIV infection. This is  called preexposure prophylaxis (PrEP). You are considered at risk if:  You are a heterosexual woman, are sexually active, and are at increased risk for HIV infection.  You take drugs by injection.  You are sexually active with a partner who has HIV.  Talk with your health care provider about whether you are at high risk of being infected with HIV. If you choose to begin PrEP, you should first be tested for HIV. You should then be tested every 3 months for as long as you are taking PrEP.  Osteoporosis is a disease in which the bones lose minerals and strength  with aging. This can result in serious bone fractures or breaks. The risk of osteoporosis can be identified using a bone density scan. Women ages 65 years and over and women at risk for fractures or osteoporosis should discuss screening with their health care providers. Ask your health care provider whether you should take a calcium supplement or vitamin D to reduce the rate of osteoporosis.  Menopause can be associated with physical symptoms and risks. Hormone replacement therapy is available to decrease symptoms and risks. You should talk to your health care provider about whether hormone replacement therapy is right for you.  Use sunscreen. Apply sunscreen liberally and repeatedly throughout the day. You should seek shade when your shadow is shorter than you. Protect yourself by wearing long sleeves, pants, a wide-brimmed hat, and sunglasses year round, whenever you are outdoors.  Once a month, do a whole body skin exam, using a mirror to look at the skin on your back. Tell your health care provider of new moles, moles that have irregular borders, moles that are larger than a pencil eraser, or moles that have changed in shape or color.  Stay current with required vaccines (immunizations).  Influenza vaccine. All adults should be immunized every year.  Tetanus, diphtheria, and acellular pertussis (Td, Tdap) vaccine. Pregnant women should  receive 1 dose of Tdap vaccine during each pregnancy. The dose should be obtained regardless of the length of time since the last dose. Immunization is preferred during the 27th-36th week of gestation. An adult who has not previously received Tdap or who does not know her vaccine status should receive 1 dose of Tdap. This initial dose should be followed by tetanus and diphtheria toxoids (Td) booster doses every 10 years. Adults with an unknown or incomplete history of completing a 3-dose immunization series with Td-containing vaccines should begin or complete a primary immunization series including a Tdap dose. Adults should receive a Td booster every 10 years.  Varicella vaccine. An adult without evidence of immunity to varicella should receive 2 doses or a second dose if she has previously received 1 dose. Pregnant females who do not have evidence of immunity should receive the first dose after pregnancy. This first dose should be obtained before leaving the health care facility. The second dose should be obtained 4-8 weeks after the first dose.  Human papillomavirus (HPV) vaccine. Females aged 13-26 years who have not received the vaccine previously should obtain the 3-dose series. The vaccine is not recommended for use in pregnant females. However, pregnancy testing is not needed before receiving a dose. If a female is found to be pregnant after receiving a dose, no treatment is needed. In that case, the remaining doses should be delayed until after the pregnancy. Immunization is recommended for any person with an immunocompromised condition through the age of 26 years if she did not get any or all doses earlier. During the 3-dose series, the second dose should be obtained 4-8 weeks after the first dose. The third dose should be obtained 24 weeks after the first dose and 16 weeks after the second dose.  Zoster vaccine. One dose is recommended for adults aged 60 years or older unless certain conditions are  present.  Measles, mumps, and rubella (MMR) vaccine. Adults born before 1957 generally are considered immune to measles and mumps. Adults born in 1957 or later should have 1 or more doses of MMR vaccine unless there is a contraindication to the vaccine or there is laboratory evidence of immunity to   each of the three diseases. A routine second dose of MMR vaccine should be obtained at least 28 days after the first dose for students attending postsecondary schools, health care workers, or international travelers. People who received inactivated measles vaccine or an unknown type of measles vaccine during 1963-1967 should receive 2 doses of MMR vaccine. People who received inactivated mumps vaccine or an unknown type of mumps vaccine before 1979 and are at high risk for mumps infection should consider immunization with 2 doses of MMR vaccine. For females of childbearing age, rubella immunity should be determined. If there is no evidence of immunity, females who are not pregnant should be vaccinated. If there is no evidence of immunity, females who are pregnant should delay immunization until after pregnancy. Unvaccinated health care workers born before [birth year] who lack laboratory evidence of measles, mumps, or rubella immunity or laboratory confirmation of disease should consider measles and mumps immunization with 2 doses of MMR vaccine or rubella immunization with 1 dose of MMR vaccine.  Pneumococcal 13-valent conjugate (PCV13) vaccine. When indicated, a person who is uncertain of her immunization history and has no record of immunization should receive the PCV13 vaccine. An adult aged 60 years or older who has certain medical conditions and has not been previously immunized should receive 1 dose of PCV13 vaccine. This PCV13 should be followed with a dose of pneumococcal polysaccharide (PPSV23) vaccine. The PPSV23 vaccine dose should be obtained at least 8 weeks after the dose of PCV13 vaccine. An adult aged 82  years or older who has certain medical conditions and previously received 1 or more doses of PPSV23 vaccine should receive 1 dose of PCV13. The PCV13 vaccine dose should be obtained 1 or more years after the last PPSV23 vaccine dose.  Pneumococcal polysaccharide (PPSV23) vaccine. When PCV13 is also indicated, PCV13 should be obtained first. All adults aged 54 years and older should be immunized. An adult younger than age 52 years who has certain medical conditions should be immunized. Any person who resides in a nursing home or long-term care facility should be immunized. An adult smoker should be immunized. People with an immunocompromised condition and certain other conditions should receive both PCV13 and PPSV23 vaccines. People with human immunodeficiency virus (HIV) infection should be immunized as soon as possible after diagnosis. Immunization during chemotherapy or radiation therapy should be avoided. Routine use of PPSV23 vaccine is not recommended for American Indians, [place]atives, or people younger than 65 years unless there are medical conditions that require PPSV23 vaccine. When indicated, people who have unknown immunization and have no record of immunization should receive PPSV23 vaccine. One-time revaccination 5 years after the first dose of PPSV23 is recommended for people aged 19-64 years who have chronic kidney failure, nephrotic syndrome, asplenia, or immunocompromised conditions. People who received 1-2 doses of PPSV23 before age 88 years should receive another dose of PPSV23 vaccine at age 66 years or later if at least 5 years have passed since the previous dose. Doses of PPSV23 are not needed for people immunized with PPSV23 at or after age 31 years.  Meningococcal vaccine. Adults with asplenia or persistent complement component deficiencies should receive 2 doses of quadrivalent meningococcal conjugate (MenACWY-D) vaccine. The doses should be obtained at least 2 months apart.  Microbiologists working with certain meningococcal bacteria, [place] recruits, people at risk during an outbreak, and people who travel to or live in countries with a high rate of meningitis should be immunized. A first-year college student up through age  21 years who is living in a residence hall should receive a dose if she did not receive a dose on or after her 16th birthday. Adults who have certain high-risk conditions should receive one or more doses of vaccine.  Hepatitis A vaccine. Adults who wish to be protected from this disease, have certain high-risk conditions, work with hepatitis A-infected animals, work in hepatitis A research labs, or travel to or work in countries with a high rate of hepatitis A should be immunized. Adults who were previously unvaccinated and who anticipate close contact with an international adoptee during the first 60 days after arrival in the [place] from a country with a high rate of hepatitis A should be immunized.  Hepatitis B vaccine. Adults who wish to be protected from this disease, have certain high-risk conditions, may be exposed to blood or other infectious body fluids, are household contacts or sex partners of hepatitis B positive people, are clients or workers in certain care facilities, or travel to or work in countries with a high rate of hepatitis B should be immunized.  Haemophilus influenzae type b (Hib) vaccine. A previously unvaccinated person with asplenia or sickle cell disease or having a scheduled splenectomy should receive 1 dose of Hib vaccine. Regardless of previous immunization, a recipient of a hematopoietic stem cell transplant should receive a 3-dose series 6-12 months after her successful transplant. Hib vaccine is not recommended for adults with HIV infection. Preventive Services / Frequency Ages 65 to 29 years  Blood pressure check.** / Every 1 to 2 years.  Lipid and cholesterol check.** / Every 5 years beginning at age  65.  Clinical breast exam.** / Every 3 years for women in their 7s and 86s.  BRCA-related cancer risk assessment.** / For women who have family members with a BRCA-related cancer (breast, ovarian, tubal, or peritoneal cancers).  Pap test.** / Every 2 years from ages 65 through 66. Every 3 years starting at age 54 through age 80 or 58 with a history of 3 consecutive normal Pap tests.  HPV screening.** / Every 3 years from ages 26 through ages 57 to 63 with a history of 3 consecutive normal Pap tests.  Hepatitis C blood test.** / For any individual with known risks for hepatitis C.  Skin self-exam. / Monthly.  Influenza vaccine. / Every year.  Tetanus, diphtheria, and acellular pertussis (Tdap, Td) vaccine.** / Consult your health care provider. Pregnant women should receive 1 dose of Tdap vaccine during each pregnancy. 1 dose of Td every 10 years.  Varicella vaccine.** / Consult your health care provider. Pregnant females who do not have evidence of immunity should receive the first dose after pregnancy.  HPV vaccine. / 3 doses over 6 months, if 48 and younger. The vaccine is not recommended for use in pregnant females. However, pregnancy testing is not needed before receiving a dose.  Measles, mumps, rubella (MMR) vaccine.** / You need at least 1 dose of MMR if you were born in 1957 or later. You may also need a 2nd dose. For females of childbearing age, rubella immunity should be determined. If there is no evidence of immunity, females who are not pregnant should be vaccinated. If there is no evidence of immunity, females who are pregnant should delay immunization until after pregnancy.  Pneumococcal 13-valent conjugate (PCV13) vaccine.** / Consult your health care provider.  Pneumococcal polysaccharide (PPSV23) vaccine.** / 1 to 2 doses if you smoke cigarettes or if you have certain conditions.  Meningococcal vaccine.** /  1 dose if you are age 19 to 21 years and a first-year college  student living in a residence hall, or have one of several medical conditions, you need to get vaccinated against meningococcal disease. You may also need additional booster doses.  Hepatitis A vaccine.** / Consult your health care provider.  Hepatitis B vaccine.** / Consult your health care provider.  Haemophilus influenzae type b (Hib) vaccine.** / Consult your health care provider. Ages 40 to 64 years  Blood pressure check.** / Every 1 to 2 years.  Lipid and cholesterol check.** / Every 5 years beginning at age 20 years.  Lung cancer screening. / Every year if you are aged 55-80 years and have a 30-pack-year history of smoking and currently smoke or have quit within the past 15 years. Yearly screening is stopped once you have quit smoking for at least 15 years or develop a health problem that would prevent you from having lung cancer treatment.  Clinical breast exam.** / Every year after age 40 years.  BRCA-related cancer risk assessment.** / For women who have family members with a BRCA-related cancer (breast, ovarian, tubal, or peritoneal cancers).  Mammogram.** / Every year beginning at age 40 years and continuing for as long as you are in good health. Consult with your health care provider.  Pap test.** / Every 3 years starting at age 30 years through age 65 or 70 years with a history of 3 consecutive normal Pap tests.  HPV screening.** / Every 3 years from ages 30 years through ages 65 to 70 years with a history of 3 consecutive normal Pap tests.  Fecal occult blood test (FOBT) of stool. / Every year beginning at age 50 years and continuing until age 75 years. You may not need to do this test if you get a colonoscopy every 10 years.  Flexible sigmoidoscopy or colonoscopy.** / Every 5 years for a flexible sigmoidoscopy or every 10 years for a colonoscopy beginning at age 50 years and continuing until age 75 years.  Hepatitis C blood test.** / For all people born from 1945 through  1965 and any individual with known risks for hepatitis C.  Skin self-exam. / Monthly.  Influenza vaccine. / Every year.  Tetanus, diphtheria, and acellular pertussis (Tdap/Td) vaccine.** / Consult your health care provider. Pregnant women should receive 1 dose of Tdap vaccine during each pregnancy. 1 dose of Td every 10 years.  Varicella vaccine.** / Consult your health care provider. Pregnant females who do not have evidence of immunity should receive the first dose after pregnancy.  Zoster vaccine.** / 1 dose for adults aged 60 years or older.  Measles, mumps, rubella (MMR) vaccine.** / You need at least 1 dose of MMR if you were born in 1957 or later. You may also need a 2nd dose. For females of childbearing age, rubella immunity should be determined. If there is no evidence of immunity, females who are not pregnant should be vaccinated. If there is no evidence of immunity, females who are pregnant should delay immunization until after pregnancy.  Pneumococcal 13-valent conjugate (PCV13) vaccine.** / Consult your health care provider.  Pneumococcal polysaccharide (PPSV23) vaccine.** / 1 to 2 doses if you smoke cigarettes or if you have certain conditions.  Meningococcal vaccine.** / Consult your health care provider.  Hepatitis A vaccine.** / Consult your health care provider.  Hepatitis B vaccine.** / Consult your health care provider.  Haemophilus influenzae type b (Hib) vaccine.** / Consult your health care provider. Ages 65   years and over  Blood pressure check.** / Every 1 to 2 years.  Lipid and cholesterol check.** / Every 5 years beginning at age 22 years.  Lung cancer screening. / Every year if you are aged 73-80 years and have a 30-pack-year history of smoking and currently smoke or have quit within the past 15 years. Yearly screening is stopped once you have quit smoking for at least 15 years or develop a health problem that would prevent you from having lung cancer  treatment.  Clinical breast exam.** / Every year after age 4 years.  BRCA-related cancer risk assessment.** / For women who have family members with a BRCA-related cancer (breast, ovarian, tubal, or peritoneal cancers).  Mammogram.** / Every year beginning at age 40 years and continuing for as long as you are in good health. Consult with your health care provider.  Pap test.** / Every 3 years starting at age 9 years through age 34 or 91 years with 3 consecutive normal Pap tests. Testing can be stopped between 65 and 70 years with 3 consecutive normal Pap tests and no abnormal Pap or HPV tests in the past 10 years.  HPV screening.** / Every 3 years from ages 57 years through ages 64 or 45 years with a history of 3 consecutive normal Pap tests. Testing can be stopped between 65 and 70 years with 3 consecutive normal Pap tests and no abnormal Pap or HPV tests in the past 10 years.  Fecal occult blood test (FOBT) of stool. / Every year beginning at age 15 years and continuing until age 17 years. You may not need to do this test if you get a colonoscopy every 10 years.  Flexible sigmoidoscopy or colonoscopy.** / Every 5 years for a flexible sigmoidoscopy or every 10 years for a colonoscopy beginning at age 86 years and continuing until age 71 years.  Hepatitis C blood test.** / For all people born from [birth year] through 1965 and any individual with known risks for hepatitis C.  Osteoporosis screening.** / A one-time screening for women ages 83 years and over and women at risk for fractures or osteoporosis.  Skin self-exam. / Monthly.  Influenza vaccine. / Every year.  Tetanus, diphtheria, and acellular pertussis (Tdap/Td) vaccine.** / 1 dose of Td every 10 years.  Varicella vaccine.** / Consult your health care provider.  Zoster vaccine.** / 1 dose for adults aged 61 years or older.  Pneumococcal 13-valent conjugate (PCV13) vaccine.** / Consult your health care provider.  Pneumococcal  polysaccharide (PPSV23) vaccine.** / 1 dose for all adults aged 28 years and older.  Meningococcal vaccine.** / Consult your health care provider.  Hepatitis A vaccine.** / Consult your health care provider.  Hepatitis B vaccine.** / Consult your health care provider.  Haemophilus influenzae type b (Hib) vaccine.** / Consult your health care provider. ** Family history and personal history of risk and conditions may change your health care provider's recommendations. Document Released: 03/27/2001 Document Revised: 06/15/2013 Document Reviewed: 06/26/2010 [facility] Patient Information 2015 [place], [place]. This information is not intended to replace advice given to you by your health care provider. Make sure you discuss any questions you have with your health care provider.

## 2013-10-20 NOTE — Progress Notes (Signed)
Subjective:     Rachel Wiley is a 38 y.o. female and is here for a comprehensive physical exam. The patient reports no problems.  History   Social History  . Marital Status: Married    Spouse Name: N/A    Number of Children: N/A  . Years of Education: N/A   Occupational History  .      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 Smoking status: Never Smoker   . Smokeless tobacco: Not on file  . Alcohol Use: No  . Drug Use: No  . Sexual Activity: Yes   Other Topics Concern  . Not on file   Social History Narrative   Never Smoked   Alcohol use-no   Drug use-no   Smoking Status:  never   Drug Use:  No   Exercise--- walking            Health Maintenance  Topic Date Due  . Pap Smear  04/07/2013  . Influenza Vaccine  09/13/2014  . Tetanus/tdap  06/18/2021    The following portions of the patient's history were reviewed and updated as appropriate:  She  has a past medical history of Anxiety; Palpitations; Other acute reactions to stress; Headache(784.0); Lipoma of unspecified site; Tachycardia; and SVD (spontaneous vaginal delivery) (06/18/2011). She  does not have any pertinent problems on file. She  has past surgical history that includes No past surgeries. Her family history includes Hypertension in her mother and another family member. There is no history of Anesthesia problems, Hypotension, Malignant hyperthermia, or Pseudochol deficiency. She  reports that she has never smoked. She does not have any smokeless tobacco history on file. She reports that she does not drink alcohol or use illicit drugs. She has a current medication list which includes the following prescription(s): propranolol. Current Outpatient Prescriptions on File Prior to Visit  Medication Sig Dispense Refill  . propranolol (INDERAL) 20 MG tablet Pt to take one tablet once a day as needed.  30 tablet  2   No current facility-administered medications on file prior to visit.   She has  No Known Allergies..  Review of Systems Review of Systems  Constitutional: Negative for activity change, appetite change and fatigue.  HENT: Negative for hearing loss, congestion, tinnitus and ear discharge.  dentist -- no Eyes: Negative for visual disturbance (see optho q1y -- vision corrected to 20/20 with glasses).  Respiratory: Negative for cough, chest tightness and shortness of breath.   Cardiovascular: Negative for chest pain, palpitations and leg swelling.  Gastrointestinal: Negative for abdominal pain, diarrhea, constipation and abdominal distention.  Genitourinary: Negative for urgency, frequency, decreased urine volume and difficulty urinating.  Musculoskeletal: Negative for back pain, arthralgias and gait problem.  Skin: Negative for color change, pallor and rash.  Neurological: Negative for dizziness, light-headedness, numbness and headaches.  Hematological: Negative for adenopathy. Does not bruise/bleed easily.  Psychiatric/Behavioral: Negative for suicidal ideas, confusion, sleep disturbance, self-injury, dysphoric mood, decreased concentration and agitation.       Objective:    Pulse 76  Temp(Src) 98.1 F (36.7 C) (Oral)  Ht 5\' 4"  (1.626 m)  Wt 152 lb 8.9 oz (69.2 kg)  BMI 26.17 kg/m2  SpO2 97%  LMP 10/20/2013 General appearance: alert, cooperative, appears stated age and no distress Head: Normocephalic, without obvious abnormality, atraumatic Eyes: conjunctivae/corneas clear. PERRL, EOM's intact. Fundi benign. Ears: normal TM's and external ear canals both ears Nose: Nares normal. Septum midline. Mucosa normal. No drainage or sinus  tenderness. Throat: lips, mucosa, and tongue normal; teeth and gums normal Neck: no adenopathy, supple, symmetrical, trachea midline and thyroid not enlarged, symmetric, no tenderness/mass/nodules Back: symmetric, no curvature. ROM normal. No CVA tenderness. Lungs: clear to auscultation bilaterally Breasts: normal appearance, no  masses or tenderness Heart: regular rate and rhythm, S1, S2 normal, no murmur, click, rub or gallop Abdomen: soft, non-tender; bowel sounds normal; no masses,  no organomegaly Pelvic: deferred-- pt on period Extremities: extremities normal, atraumatic, no cyanosis or edema Pulses: 2+ and symmetric Skin: Skin color, texture, turgor normal. No rashes or lesions Lymph nodes: Cervical, supraclavicular, and axillary nodes normal. Neurologic: Alert and oriented X 3, normal strength and tone. Normal symmetric reflexes. Normal coordination and gait Psych--no depression, no anxiety      Assessment:    Healthy female exam.       Plan:    ghm utd Check labs  See After Visit Summary for Counseling Recommendations

## 2013-10-20 NOTE — Progress Notes (Signed)
Pre visit review using our clinic review tool, if applicable. No additional management support is needed unless otherwise documented below in the visit note. 

## 2013-10-23 ENCOUNTER — Encounter: Payer: Self-pay | Admitting: Cardiovascular Disease

## 2013-10-23 ENCOUNTER — Ambulatory Visit (INDEPENDENT_AMBULATORY_CARE_PROVIDER_SITE_OTHER): Payer: BC Managed Care – PPO | Admitting: Cardiovascular Disease

## 2013-10-23 VITALS — BP 132/92 | HR 71 | Ht 64.0 in | Wt 150.0 lb

## 2013-10-23 DIAGNOSIS — R002 Palpitations: Secondary | ICD-10-CM

## 2013-10-23 DIAGNOSIS — R000 Tachycardia, unspecified: Secondary | ICD-10-CM

## 2013-10-23 NOTE — Assessment & Plan Note (Signed)
Benign improved no structural heart disease Labs ok with Dr Etter Sjogren  Continue PRN inderal

## 2013-10-23 NOTE — Patient Instructions (Signed)
Your physician wants you to follow-up in: 1 YEAR.  You will receive a reminder letter in the mail two months in advance. If you don't receive a letter, please call our office to schedule the follow-up appointment.  Your physician recommends that you continue on your current medications as directed. Please refer to the Current Medication list given to you today.  

## 2013-10-23 NOTE — Progress Notes (Signed)
Patient ID: Rachel Wiley, female   DOB: 1976-01-27, 38 y.o.   MRN: [id number] 38 yo referred by Dr Etter Sjogren for palpitatins and dyspnea in 2012 . Improved with Inderal 40mg  but this was stopped last year. Last couple of months occasional episodes of palpitations and noticing her HR. Seems to have a propensity for exaggerated HR response to exercise. Reviewed labs by Dr Etter Sjogren and normal with no anemia or thyroid disease. Some stress with living arrangements as her and her husband have been back and forth to [place]. No SSCP and mild dyspnea with walking. No stimulants and only 2 cups of tea /day. Has not had SSCP, presyncope, edema. Sometimes headaches have made her pulse faster.  Has not had to take Inderal all year until August when she was in [place] for 10 weeks with children who are 3 and 7 now      ROS: Denies fever, malais, weight loss, blurry vision, decreased visual acuity, cough, sputum, SOB, hemoptysis, pleuritic pain, palpitaitons, heartburn, abdominal pain, melena, lower extremity edema, claudication, or rash.  All other systems reviewed and negative  General: Affect appropriate Healthy:  appears stated age 65: normal Neck supple with no adenopathy JVP normal no bruits no thyromegaly Lungs clear with no wheezing and good diaphragmatic motion Heart:  S1/S2 no murmur, no rub, gallop or click PMI normal Abdomen: benighn, BS positve, no tenderness, no AAA no bruit.  No HSM or HJR Distal pulses intact with no bruits No edema Neuro non-focal Skin warm and dry No muscular weakness   Current Outpatient Prescriptions  Medication Sig Dispense Refill  . propranolol (INDERAL) 20 MG tablet Pt to take one tablet once a day as needed.  30 tablet  2   No current facility-administered medications for this visit.    Allergies  Review of patient's allergies indicates no known allergies.  Electrocardiogram:  SR rate 71 normal   Assessment and Plan

## 2013-11-05 ENCOUNTER — Other Ambulatory Visit (HOSPITAL_COMMUNITY)
Admission: RE | Admit: 2013-11-05 | Discharge: 2013-11-05 | Disposition: A | Discharge status: Home / Self Care | Payer: BC Managed Care – PPO | Source: Ambulatory Visit | Attending: Family Medicine | Admitting: Family Medicine

## 2013-11-05 ENCOUNTER — Ambulatory Visit (INDEPENDENT_AMBULATORY_CARE_PROVIDER_SITE_OTHER): Payer: BC Managed Care – PPO | Admitting: Family Medicine

## 2013-11-05 ENCOUNTER — Encounter: Payer: Self-pay | Admitting: Family Medicine

## 2013-11-05 VITALS — BP 132/85 | HR 79 | Temp 98.8°F | Wt 150.0 lb

## 2013-11-05 DIAGNOSIS — Z1151 Encounter for screening for human papillomavirus (HPV): Secondary | ICD-10-CM | POA: Diagnosis present

## 2013-11-05 DIAGNOSIS — Z01419 Encounter for gynecological examination (general) (routine) without abnormal findings: Secondary | ICD-10-CM | POA: Diagnosis present

## 2013-11-05 DIAGNOSIS — Z0000 Encounter for general adult medical examination without abnormal findings: Secondary | ICD-10-CM

## 2013-11-05 DIAGNOSIS — Z124 Encounter for screening for malignant neoplasm of cervix: Secondary | ICD-10-CM

## 2013-11-05 LAB — POCT URINALYSIS DIPSTICK
Bilirubin, UA: NEGATIVE
Blood, UA: NEGATIVE
GLUCOSE UA: NEGATIVE
Ketones, UA: NEGATIVE
Leukocytes, UA: NEGATIVE
NITRITE UA: NEGATIVE
PROTEIN UA: NEGATIVE
SPEC GRAV UA: 1.015
UROBILINOGEN UA: 0.2
pH, UA: 7.5

## 2013-11-05 NOTE — Progress Notes (Signed)
  Subjective:     Rachel Wiley is a 38 y.o. woman who comes in today for a  pap smear only. Her most recent annual exam was on 9/8. Her most recent Pap smear was on a year ago and showed no abnormalities. Previous abnormal Pap smears: no. Contraception: none  The following portions of the patient's history were reviewed and updated as appropriate: allergies, current medications, past family history, past medical history, past social history, past surgical history and problem list.  Review of Systems Pertinent items are noted in HPI.   Objective:    BP 132/85  Pulse 79  Temp(Src) 98.8 F (37.1 C) (Oral)  Wt 150 lb (68.04 kg)  SpO2 98%  LMP 10/20/2013 Pelvic Exam: cervix normal in appearance, external genitalia normal, no adnexal masses or tenderness, no bladder tenderness, no cervical motion tenderness, rectovaginal septum normal, urethra without abnormality or discharge, uterus normal size, shape, and consistency and vagina normal without discharge. Pap smear obtained.   Assessment:    Screening pap smear.   Plan:    Follow up in 1 year, or as indicated by Pap results.

## 2013-11-05 NOTE — Patient Instructions (Signed)
Pap Test A Pap test is a procedure done in a clinic office to evaluate cells that are on the surface of the cervix. The cervix is the lower portion of the uterus and upper portion of the vagina. For some women, the cervical region has the potential to form cancer. With consistent evaluations by your caregiver, this type of cancer can be prevented.  If a Pap test is abnormal, it is most often a result of a previous exposure to human papillomavirus (HPV). HPV is a virus that can infect the cells of the cervix and cause dysplasia. Dysplasia is where the cells no longer look normal. If a woman has been diagnosed with high-grade or severe dysplasia, they are at higher risk of developing cervical cancer. People diagnosed with low-grade dysplasia should still be seen by their caregiver because there is a small chance that low-grade dysplasia could develop into cancer.  LET YOUR CAREGIVER KNOW ABOUT:  Recent sexually transmitted infection (STI) you have had.  Any new sex partners you have had.  History of previous abnormal Pap tests results.  History of previous cervical procedures you have had (colposcopy, biopsy, loop electrosurgical excision procedure [LEEP]).  Concerns you have had regarding unusual vaginal discharge.  History of pelvic pain.  Your use of birth control. BEFORE THE PROCEDURE  Ask your caregiver when to schedule your Pap test. It is best not to be on your period if your caregiver uses a wooden spatula to collect cells or applies cells to a glass slide. Newer techniques are not so sensitive to the timing of a menstrual cycle.  Do not douche or have sexual intercourse for 24 hours before the test.   Do not use vaginal creams or tampons for 24 hours before the test.   Empty your bladder just before the test to lessen any discomfort.  PROCEDURE You will lie on an exam table with your feet in stirrups. A warm metal or plastic instrument (speculum) is placed in your vagina. This  instrument allows your caregiver to see the inside of your vagina and look at your cervix. A small, plastic brush or wooden spatula is then used to collect cervical cells. These cells are placed in a lab specimen container. The cells are looked at under a microscope. A specialist will determine if the cells are normal.  AFTER THE PROCEDURE Make sure to get your test results.If your results come back abnormal, you may need further testing.  Document Released: 04/21/2002 Document Revised: 04/23/2011 Document Reviewed: 01/25/2011 [employer] Patient Information 2015 [employer]. This information is not intended to replace advice given to you by your health care provider. Make sure you discuss any questions you have with your health care provider.  

## 2013-11-05 NOTE — Progress Notes (Signed)
Pre visit review using our clinic review tool, if applicable. No additional management support is needed unless otherwise documented below in the visit note. 

## 2013-11-06 LAB — CYTOLOGY - PAP

## 2013-11-12 ENCOUNTER — Encounter: Payer: Self-pay | Admitting: Cardiovascular Disease

## 2013-12-14 ENCOUNTER — Encounter: Payer: Self-pay | Admitting: Family Medicine

## 2014-09-23 ENCOUNTER — Ambulatory Visit (INDEPENDENT_AMBULATORY_CARE_PROVIDER_SITE_OTHER): Payer: BLUE CROSS/BLUE SHIELD | Admitting: Family Medicine

## 2014-09-23 ENCOUNTER — Encounter: Payer: Self-pay | Admitting: Family Medicine

## 2014-09-23 VITALS — BP 116/76 | HR 93 | Temp 98.2°F | Wt 155.8 lb

## 2014-09-23 DIAGNOSIS — E049 Nontoxic goiter, unspecified: Secondary | ICD-10-CM

## 2014-09-23 DIAGNOSIS — J302 Other seasonal allergic rhinitis: Secondary | ICD-10-CM

## 2014-09-23 DIAGNOSIS — J029 Acute pharyngitis, unspecified: Secondary | ICD-10-CM

## 2014-09-23 LAB — POCT RAPID STREP A (OFFICE): Rapid Strep A Screen: NEGATIVE

## 2014-09-23 LAB — THYROID PANEL WITH TSH
Free Thyroxine Index: 2.5 (ref 1.4–3.8)
T3 Uptake: 29 % (ref 22–35)
T4, Total: 8.5 ug/dL (ref 4.5–12.0)
TSH: 1.129 u[IU]/mL (ref 0.350–4.500)

## 2014-09-23 MED ORDER — CETIRIZINE HCL 10 MG PO TABS: 10.0000 mg | ORAL_TABLET | Freq: Every day | ORAL | Status: DC

## 2014-09-23 MED ORDER — FLUTICASONE PROPIONATE 50 MCG/ACT NA SUSP: 2.0000 | Freq: Every day | NASAL | Status: DC

## 2014-09-23 NOTE — Patient Instructions (Signed)
Goiter Goiter is an enlarged thyroid gland. The thyroid gland sits at the base of the front of the neck. The gland produces hormones that regulate mood, body temperature, pulse rate, and digestion. Most goiters are painless and are not a cause for serious concern. Goiters and conditions that cause goiters can be treated if necessary.  CAUSES  Common causes of goiter include:  Graves disease (causes too much hormone to be produced [hyperthyroidism]).  Hashimoto disease (causes too little hormone to be produced [hypothyroidism]).  Thyroiditis (inflammation of the thyroid sometimes caused by virus or pregnancy).  Nodular goiter (small bumps form; sometimes called toxic nodular goiter).  Pregnancy.  Thyroid cancer (very few goiters with nodules are cancerous).  Certain medications.  Radiation exposure.  Iodine deficiency (more common in developing countries in inland populations). RISK FACTORS Risk factors for goiter include:  A family history of goiter.  Female gender.  Inadequate iodine in the diet.  Age older than 25 years. SYMPTOMS  Many goiters do not cause symptoms. When symptoms do occur, they may include:  Swelling in the lower part of the neck. This swelling can range from a very small bump to a large lump.  A tight feeling in the throat.  A hoarse voice. Less commonly, a goiter may result in:  Coughing.  Wheezing.  Difficulty swallowing.  Difficulty breathing.  Bulging neck veins.  Dizziness. When a goiter is the result of hyperthyroidism, symptoms may include:  Rapid or irregular heartbeat.  Sickness in your stomach (nausea).  Vomiting.  Diarrhea.  Shaking.  Irritable feeling.  Bulging eyes.  Weight loss.  Heat sensitivity.  Anxiety. When a goiter is the result of hypothyroidism, symptoms may include:  Tiredness.  Dry skin.  Constipation.  Weight gain.  Irregular menstrual cycle.  Depressed mood.  Sensitivity to  cold. DIAGNOSIS  Tests used to diagnose goiter include:  A physical exam.  Blood tests, including thyroid hormone levels and antibody testing.  Ultrasonography, computerized X-ray scan (computed tomography, CT) or computerized magnetic scan (magnetic resonance imaging, MRI).  Thyroid scan (imaging along with safe radioactive injection).  Tissue sample taken (biopsy) of nodules. This is sometimes done to confirm that the nodules are not cancerous. TREATMENT  Treatment will depend on the cause of the goiter. Treatment may include:  Monitoring. In some cases, no treatment is necessary, and your doctor will monitor your condition at regular checkups.  Medications and supplements. Thyroid medication (thyroid hormone replacement) is available for hyperthyroidism and hypothyroidism.  If inflammation is the cause, over-the-counter medication or steroid medication may be recommended.  Goiters caused by iodine deficiency can be treated with iodine supplements or changes in diet.  Radioactive iodine treatment. Radioactive iodine is injected into the blood. It travels to the thyroid gland, kills thyroid cells, and reduces the size of the gland. This is only used when the thyroid gland is overactive. Lifelong thyroid hormone medication is often necessary after this treatment.  Surgery. A procedure to remove all or part of the gland may be recommended in severe cases or when cancer is the cause. Hormones can be taken to replace the hormones normally produced by the thyroid. HOME CARE INSTRUCTIONS   Take medications as directed.  Follow your caregiver's recommendations for any dietary changes.  Follow up with your caregiver for further examination and testing, as directed. PREVENTION   If you have a family history of goiter, discuss screening with your doctor.  Make sure you are getting enough iodine in your diet.  Use  of iodized table salt can help prevent iodine deficiency. Document  Released: 07/19/2009 Document Revised: 06/15/2013 Document Reviewed: 07/19/2009 [facility] Patient Information 2015 [place], [place]. This information is not intended to replace advice given to you by your health care provider. Make sure you discuss any questions you have with your health care provider.

## 2014-09-23 NOTE — Progress Notes (Signed)
Pre visit review using our clinic review tool, if applicable. No additional management support is needed unless otherwise documented below in the visit note. 

## 2014-09-25 ENCOUNTER — Ambulatory Visit (HOSPITAL_BASED_OUTPATIENT_CLINIC_OR_DEPARTMENT_OTHER)
Admission: RE | Admit: 2014-09-25 | Discharge: 2014-09-25 | Disposition: A | Discharge status: Home / Self Care | Payer: BLUE CROSS/BLUE SHIELD | Source: Ambulatory Visit | Attending: Family Medicine | Admitting: Family Medicine

## 2014-09-25 DIAGNOSIS — E010 Iodine-deficiency related diffuse (endemic) goiter: Secondary | ICD-10-CM | POA: Insufficient documentation

## 2014-09-25 DIAGNOSIS — E049 Nontoxic goiter, unspecified: Secondary | ICD-10-CM

## 2014-09-26 NOTE — Progress Notes (Signed)
Rachel Wiley  female [id number] Jun 02, 1975 39 y.o. 09/26/2014      Progress Note-Follow Up  Subjective   HPI  Patient is in today for  C/o sore throat.  + congestion, no cough, no fever/ chills.  She is also c/o because she was told her thyroid was big.  No trouble swallowing.  Chief Complaint  Patient presents with  . Sore Throat    x's 4 days     Past Medical History  Diagnosis Date  . Anxiety   . Palpitations   . Other acute reactions to stress   . Headache(784.0)   . Lipoma of unspecified site   . Tachycardia   . SVD (spontaneous vaginal delivery) 06/18/2011    Past Surgical History  Procedure Laterality Date  . No past surgeries      Family History  Problem Relation Age of Onset  . Hypertension    . Anesthesia problems Neg Hx   . Hypotension Neg Hx   . Malignant hyperthermia Neg Hx   . Pseudochol deficiency Neg Hx   . Hypertension Mother     Social History   Social History  . Marital Status: Married    Spouse Name: N/A  . Number of Children: N/A  . Years of Education: N/A   Occupational History  .      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 Smoking status: Never Smoker   . Smokeless tobacco: Not on file  . Alcohol Use: No  . Drug Use: No  . Sexual Activity: Yes   Other Topics Concern  . Not on file   Social History Narrative   Never Smoked   Alcohol use-no   Drug use-no   Smoking Status:  never   Drug Use:  No   Exercise--- walking             Current Outpatient Prescriptions on File Prior to Visit  Medication Sig Dispense Refill  . propranolol (INDERAL) 20 MG tablet Pt to take one tablet once a day as needed. 30 tablet 2   No current facility-administered medications on file prior to visit.    No Known Allergies  Review of Systems  Review of Systems  Constitutional: Negative for fever and malaise/fatigue.  HENT: Positive for congestion and sore throat.   Eyes: Negative for discharge.  Respiratory:  Negative for cough, sputum production and shortness of breath.   Cardiovascular: Negative for chest pain, palpitations and leg swelling.  Gastrointestinal: Negative for nausea and abdominal pain.  Genitourinary: Negative for dysuria.  Musculoskeletal: Negative for falls.  Skin: Negative for rash.  Neurological: Negative for loss of consciousness and headaches.  Endo/Heme/Allergies: Negative for environmental allergies.  Psychiatric/Behavioral: Negative for depression. The patient is not nervous/anxious.     Objective  Filed Vitals:   09/23/14 1103  BP: 116/76  Pulse: 93  Temp: 98.2 F (36.8 C)  TempSrc: Oral  Weight: 155 lb 12.8 oz (70.67 kg)  SpO2: 99%   Body mass index is 26.73 kg/(m^2).  Physical Exam  Physical Exam  Constitutional: She is well-developed, well-nourished, and in no distress.  HENT:  Head: Normocephalic and atraumatic.  Right Ear: Hearing, tympanic membrane, external ear and ear canal normal.  Left Ear: Hearing, tympanic membrane, external ear and ear canal normal.  Nose: Mucosal edema and rhinorrhea present. Right sinus exhibits no maxillary sinus tenderness and no frontal sinus tenderness. Left sinus exhibits no maxillary sinus tenderness and no frontal sinus tenderness.  Mouth/Throat: Posterior oropharyngeal erythema present. No oropharyngeal exudate or posterior oropharyngeal edema.  Vitals reviewed.   Lab Results  Component Value Date   TSH 1.129 09/23/2014   Lab Results  Component Value Date   WBC 7.5 10/20/2013   HGB 13.2 10/20/2013   HCT 40.3 10/20/2013   MCV 84.2 10/20/2013   PLT 181.0 10/20/2013   Lab Results  Component Value Date   GFR 76.50 10/20/2013   Lab Results  Component Value Date   CHOL 163 10/20/2013   Lab Results  Component Value Date   HDL 35.20* 10/20/2013   Lab Results  Component Value Date   LDLCALC 101* 10/20/2013   Lab Results  Component Value Date   TRIG 135.0 10/20/2013   Lab Results  Component Value  Date   CHOLHDL 5 10/20/2013   No results found for: HGBA1C    Assessment & Plan  1. Sore throat Viral-- if symptoms worsen or change-- call or rto  - POCT rapid strep A - cetirizine (ZYRTEC) 10 MG tablet; Take 1 tablet (10 mg total) by mouth daily.  Dispense: 30 tablet; Refill: 11 - fluticasone (FLONASE) 50 MCG/ACT nasal spray; Place 2 sprays into both nostrils daily.  Dispense: 16 g; Refill: 6  2. Goiter  - US Soft Tissue Head/Neck; Future - Thyroid Panel With TSH  3. Seasonal allergies Zyrtec/ flonase rto prn

## 2014-10-04 ENCOUNTER — Telehealth: Payer: Self-pay | Admitting: Family Medicine

## 2014-10-04 NOTE — Telephone Encounter (Signed)
Pre visit letter mailed 10/04/14

## 2014-10-22 ENCOUNTER — Telehealth: Payer: Self-pay | Admitting: *Deleted

## 2014-10-22 NOTE — Telephone Encounter (Signed)
Unable to reach patient at time of Pre-Visit Call.  Unable to leave message because patient has a voicemail box that has not been set up yet.

## 2014-10-25 ENCOUNTER — Encounter: Payer: Self-pay | Admitting: Family Medicine

## 2014-10-25 ENCOUNTER — Ambulatory Visit (INDEPENDENT_AMBULATORY_CARE_PROVIDER_SITE_OTHER): Payer: BLUE CROSS/BLUE SHIELD | Admitting: Family Medicine

## 2014-10-25 VITALS — BP 110/68 | HR 75 | Resp 16 | Ht 64.0 in | Wt 157.8 lb

## 2014-10-25 DIAGNOSIS — Z0000 Encounter for general adult medical examination without abnormal findings: Secondary | ICD-10-CM | POA: Diagnosis not present

## 2014-10-25 DIAGNOSIS — L659 Nonscarring hair loss, unspecified: Secondary | ICD-10-CM

## 2014-10-25 DIAGNOSIS — R8290 Unspecified abnormal findings in urine: Secondary | ICD-10-CM

## 2014-10-25 LAB — CBC WITH DIFFERENTIAL/PLATELET
BASOS PCT: 0.4 % (ref 0.0–3.0)
Basophils Absolute: 0 10*3/uL (ref 0.0–0.1)
Eosinophils Absolute: 0.2 10*3/uL (ref 0.0–0.7)
Eosinophils Relative: 3.1 % (ref 0.0–5.0)
HEMATOCRIT: 37 % (ref 36.0–46.0)
Hemoglobin: 12.1 g/dL (ref 12.0–15.0)
LYMPHS PCT: 37.7 % (ref 12.0–46.0)
Lymphs Abs: 2.9 10*3/uL (ref 0.7–4.0)
MCHC: 32.7 g/dL (ref 30.0–36.0)
MCV: 81.8 fl (ref 78.0–100.0)
MONOS PCT: 5.6 % (ref 3.0–12.0)
Monocytes Absolute: 0.4 10*3/uL (ref 0.1–1.0)
NEUTROS ABS: 4 10*3/uL (ref 1.4–7.7)
Neutrophils Relative %: 53.2 % (ref 43.0–77.0)
PLATELETS: 179 10*3/uL (ref 150.0–400.0)
RBC: 4.53 Mil/uL (ref 3.87–5.11)
RDW: 16.5 % — AB (ref 11.5–15.5)
WBC: 7.6 10*3/uL (ref 4.0–10.5)

## 2014-10-25 LAB — COMPREHENSIVE METABOLIC PANEL
ALBUMIN: 4 g/dL (ref 3.5–5.2)
ALK PHOS: 49 U/L (ref 39–117)
ALT: 11 U/L (ref 0–35)
AST: 14 U/L (ref 0–37)
BILIRUBIN TOTAL: 0.4 mg/dL (ref 0.2–1.2)
BUN: 10 mg/dL (ref 6–23)
CO2: 27 mEq/L (ref 19–32)
Calcium: 8.8 mg/dL (ref 8.4–10.5)
Chloride: 106 mEq/L (ref 96–112)
Creatinine, Ser: 0.74 mg/dL (ref 0.40–1.20)
GFR: 92.93 mL/min (ref >60.00)
Glucose, Bld: 78 mg/dL (ref 70–99)
POTASSIUM: 4 meq/L (ref 3.5–5.1)
Sodium: 140 mEq/L (ref 135–145)
TOTAL PROTEIN: 7.2 g/dL (ref 6.0–8.3)

## 2014-10-25 LAB — POCT URINALYSIS DIPSTICK
BILIRUBIN UA: NEGATIVE
GLUCOSE UA: NEGATIVE
Ketones, UA: NEGATIVE
Nitrite, UA: NEGATIVE
Protein, UA: NEGATIVE
SPEC GRAV UA: 1.025
Urobilinogen, UA: 0.2
pH, UA: 6

## 2014-10-25 LAB — LIPID PANEL
CHOLESTEROL: 141 mg/dL (ref 0–200)
HDL: 31.7 mg/dL — AB (ref >39.00)
LDL Cholesterol: 86 mg/dL (ref 0–99)
NonHDL: 108.93
Total CHOL/HDL Ratio: 4
Triglycerides: 113 mg/dL (ref 0.0–149.0)
VLDL: 22.6 mg/dL (ref 0.0–40.0)

## 2014-10-25 NOTE — Patient Instructions (Signed)
Preventive Care for Adults A healthy lifestyle and preventive care can promote health and wellness. Preventive health guidelines for women include the following key practices.  A routine yearly physical is a good way to check with your health care provider about your health and preventive screening. It is a chance to share any concerns and updates on your health and to receive a thorough exam.  Visit your dentist for a routine exam and preventive care every 6 months. Brush your teeth twice a day and floss once a day. Good oral hygiene prevents tooth decay and gum disease.  The frequency of eye exams is based on your age, health, family medical history, use of contact lenses, and other factors. Follow your health care provider's recommendations for frequency of eye exams.  Eat a healthy diet. Foods like vegetables, fruits, whole grains, low-fat dairy products, and lean protein foods contain the nutrients you need without too many calories. Decrease your intake of foods high in solid fats, added sugars, and salt. Eat the right amount of calories for you.Get information about a proper diet from your health care provider, if necessary.  Regular physical exercise is one of the most important things you can do for your health. Most adults should get at least 150 minutes of moderate-intensity exercise (any activity that increases your heart rate and causes you to sweat) each week. In addition, most adults need muscle-strengthening exercises on 2 or more days a week.  Maintain a healthy weight. The body mass index (BMI) is a screening tool to identify possible weight problems. It provides an estimate of body fat based on height and weight. Your health care provider can find your BMI and can help you achieve or maintain a healthy weight.For adults 20 years and older:  A BMI below 18.5 is considered underweight.  A BMI of 18.5 to 24.9 is normal.  A BMI of 25 to 29.9 is considered overweight.  A BMI of  30 and above is considered obese.  Maintain normal blood lipids and cholesterol levels by exercising and minimizing your intake of saturated fat. Eat a balanced diet with plenty of fruit and vegetables. Blood tests for lipids and cholesterol should begin at age 76 and be repeated every 5 years. If your lipid or cholesterol levels are high, you are over 50, or you are at high risk for heart disease, you may need your cholesterol levels checked more frequently.Ongoing high lipid and cholesterol levels should be treated with medicines if diet and exercise are not working.  If you smoke, find out from your health care provider how to quit. If you do not use tobacco, do not start.  Lung cancer screening is recommended for adults aged 22-80 years who are at high risk for developing lung cancer because of a history of smoking. A yearly low-dose CT scan of the lungs is recommended for people who have at least a 30-pack-year history of smoking and are a current smoker or have quit within the past 15 years. A pack year of smoking is smoking an average of 1 pack of cigarettes a day for 1 year (for example: 1 pack a day for 30 years or 2 packs a day for 15 years). Yearly screening should continue until the smoker has stopped smoking for at least 15 years. Yearly screening should be stopped for people who develop a health problem that would prevent them from having lung cancer treatment.  If you are pregnant, do not drink alcohol. If you are breastfeeding,  be very cautious about drinking alcohol. If you are not pregnant and choose to drink alcohol, do not have more than 1 drink per day. One drink is considered to be 12 ounces (355 mL) of beer, 5 ounces (148 mL) of wine, or 1.5 ounces (44 mL) of liquor.  Avoid use of street drugs. Do not share needles with anyone. Ask for help if you need support or instructions about stopping the use of drugs.  High blood pressure causes heart disease and increases the risk of  stroke. Your blood pressure should be checked at least every 1 to 2 years. Ongoing high blood pressure should be treated with medicines if weight loss and exercise do not work.  If you are 75-52 years old, ask your health care provider if you should take aspirin to prevent strokes.  Diabetes screening involves taking a blood sample to check your fasting blood sugar level. This should be done once every 3 years, after age 15, if you are within normal weight and without risk factors for diabetes. Testing should be considered at a younger age or be carried out more frequently if you are overweight and have at least 1 risk factor for diabetes.  Breast cancer screening is essential preventive care for women. You should practice "breast self-awareness." This means understanding the normal appearance and feel of your breasts and may include breast self-examination. Any changes detected, no matter how small, should be reported to a health care provider. Women in their 58s and 30s should have a clinical breast exam (CBE) by a health care provider as part of a regular health exam every 1 to 3 years. After age 16, women should have a CBE every year. Starting at age 53, women should consider having a mammogram (breast X-ray test) every year. Women who have a family history of breast cancer should talk to their health care provider about genetic screening. Women at a high risk of breast cancer should talk to their health care providers about having an MRI and a mammogram every year.  Breast cancer gene (BRCA)-related cancer risk assessment is recommended for women who have family members with BRCA-related cancers. BRCA-related cancers include breast, ovarian, tubal, and peritoneal cancers. Having family members with these cancers may be associated with an increased risk for harmful changes (mutations) in the breast cancer genes BRCA1 and BRCA2. Results of the assessment will determine the need for genetic counseling and  BRCA1 and BRCA2 testing.  Routine pelvic exams to screen for cancer are no longer recommended for nonpregnant women who are considered low risk for cancer of the pelvic organs (ovaries, uterus, and vagina) and who do not have symptoms. Ask your health care provider if a screening pelvic exam is right for you.  If you have had past treatment for cervical cancer or a condition that could lead to cancer, you need Pap tests and screening for cancer for at least 20 years after your treatment. If Pap tests have been discontinued, your risk factors (such as having a new sexual partner) need to be reassessed to determine if screening should be resumed. Some women have medical problems that increase the chance of getting cervical cancer. In these cases, your health care provider may recommend more frequent screening and Pap tests.  The HPV test is an additional test that may be used for cervical cancer screening. The HPV test looks for the virus that can cause the cell changes on the cervix. The cells collected during the Pap test can be  tested for HPV. The HPV test could be used to screen women aged 30 years and older, and should be used in women of any age who have unclear Pap test results. After the age of 30, women should have HPV testing at the same frequency as a Pap test.  Colorectal cancer can be detected and often prevented. Most routine colorectal cancer screening begins at the age of 50 years and continues through age 75 years. However, your health care provider may recommend screening at an earlier age if you have risk factors for colon cancer. On a yearly basis, your health care provider may provide home test kits to check for hidden blood in the stool. Use of a small camera at the end of a tube, to directly examine the colon (sigmoidoscopy or colonoscopy), can detect the earliest forms of colorectal cancer. Talk to your health care provider about this at age 50, when routine screening begins. Direct  exam of the colon should be repeated every 5-10 years through age 75 years, unless early forms of pre-cancerous polyps or small growths are found.  People who are at an increased risk for hepatitis B should be screened for this virus. You are considered at high risk for hepatitis B if:  You were born in a country where hepatitis B occurs often. Talk with your health care provider about which countries are considered high risk.  Your parents were born in a high-risk country and you have not received a shot to protect against hepatitis B (hepatitis B vaccine).  You have HIV or AIDS.  You use needles to inject street drugs.  You live with, or have sex with, someone who has hepatitis B.  You get hemodialysis treatment.  You take certain medicines for conditions like cancer, organ transplantation, and autoimmune conditions.  Hepatitis C blood testing is recommended for all people born from 1945 through 1965 and any individual with known risks for hepatitis C.  Practice safe sex. Use condoms and avoid high-risk sexual practices to reduce the spread of sexually transmitted infections (STIs). STIs include gonorrhea, chlamydia, syphilis, trichomonas, herpes, HPV, and human immunodeficiency virus (HIV). Herpes, HIV, and HPV are viral illnesses that have no cure. They can result in disability, cancer, and death.  You should be screened for sexually transmitted illnesses (STIs) including gonorrhea and chlamydia if:  You are sexually active and are younger than 24 years.  You are older than 24 years and your health care provider tells you that you are at risk for this type of infection.  Your sexual activity has changed since you were last screened and you are at an increased risk for chlamydia or gonorrhea. Ask your health care provider if you are at risk.  If you are at risk of being infected with HIV, it is recommended that you take a prescription medicine daily to prevent HIV infection. This is  called preexposure prophylaxis (PrEP). You are considered at risk if:  You are a heterosexual woman, are sexually active, and are at increased risk for HIV infection.  You take drugs by injection.  You are sexually active with a partner who has HIV.  Talk with your health care provider about whether you are at high risk of being infected with HIV. If you choose to begin PrEP, you should first be tested for HIV. You should then be tested every 3 months for as long as you are taking PrEP.  Osteoporosis is a disease in which the bones lose minerals and strength   with aging. This can result in serious bone fractures or breaks. The risk of osteoporosis can be identified using a bone density scan. Women ages 65 years and over and women at risk for fractures or osteoporosis should discuss screening with their health care providers. Ask your health care provider whether you should take a calcium supplement or vitamin D to reduce the rate of osteoporosis.  Menopause can be associated with physical symptoms and risks. Hormone replacement therapy is available to decrease symptoms and risks. You should talk to your health care provider about whether hormone replacement therapy is right for you.  Use sunscreen. Apply sunscreen liberally and repeatedly throughout the day. You should seek shade when your shadow is shorter than you. Protect yourself by wearing long sleeves, pants, a wide-brimmed hat, and sunglasses year round, whenever you are outdoors.  Once a month, do a whole body skin exam, using a mirror to look at the skin on your back. Tell your health care provider of new moles, moles that have irregular borders, moles that are larger than a pencil eraser, or moles that have changed in shape or color.  Stay current with required vaccines (immunizations).  Influenza vaccine. All adults should be immunized every year.  Tetanus, diphtheria, and acellular pertussis (Td, Tdap) vaccine. Pregnant women should  receive 1 dose of Tdap vaccine during each pregnancy. The dose should be obtained regardless of the length of time since the last dose. Immunization is preferred during the 27th-36th week of gestation. An adult who has not previously received Tdap or who does not know her vaccine status should receive 1 dose of Tdap. This initial dose should be followed by tetanus and diphtheria toxoids (Td) booster doses every 10 years. Adults with an unknown or incomplete history of completing a 3-dose immunization series with Td-containing vaccines should begin or complete a primary immunization series including a Tdap dose. Adults should receive a Td booster every 10 years.  Varicella vaccine. An adult without evidence of immunity to varicella should receive 2 doses or a second dose if she has previously received 1 dose. Pregnant females who do not have evidence of immunity should receive the first dose after pregnancy. This first dose should be obtained before leaving the health care facility. The second dose should be obtained 4-8 weeks after the first dose.  Human papillomavirus (HPV) vaccine. Females aged 13-26 years who have not received the vaccine previously should obtain the 3-dose series. The vaccine is not recommended for use in pregnant females. However, pregnancy testing is not needed before receiving a dose. If a female is found to be pregnant after receiving a dose, no treatment is needed. In that case, the remaining doses should be delayed until after the pregnancy. Immunization is recommended for any person with an immunocompromised condition through the age of 26 years if she did not get any or all doses earlier. During the 3-dose series, the second dose should be obtained 4-8 weeks after the first dose. The third dose should be obtained 24 weeks after the first dose and 16 weeks after the second dose.  Zoster vaccine. One dose is recommended for adults aged 60 years or older unless certain conditions are  present.  Measles, mumps, and rubella (MMR) vaccine. Adults born before 1957 generally are considered immune to measles and mumps. Adults born in 1957 or later should have 1 or more doses of MMR vaccine unless there is a contraindication to the vaccine or there is laboratory evidence of immunity to   each of the three diseases. A routine second dose of MMR vaccine should be obtained at least 28 days after the first dose for students attending postsecondary schools, health care workers, or international travelers. People who received inactivated measles vaccine or an unknown type of measles vaccine during 1963-1967 should receive 2 doses of MMR vaccine. People who received inactivated mumps vaccine or an unknown type of mumps vaccine before 1979 and are at high risk for mumps infection should consider immunization with 2 doses of MMR vaccine. For females of childbearing age, rubella immunity should be determined. If there is no evidence of immunity, females who are not pregnant should be vaccinated. If there is no evidence of immunity, females who are pregnant should delay immunization until after pregnancy. Unvaccinated health care workers born before 1957 who lack laboratory evidence of measles, mumps, or rubella immunity or laboratory confirmation of disease should consider measles and mumps immunization with 2 doses of MMR vaccine or rubella immunization with 1 dose of MMR vaccine.  Pneumococcal 13-valent conjugate (PCV13) vaccine. When indicated, a person who is uncertain of her immunization history and has no record of immunization should receive the PCV13 vaccine. An adult aged 19 years or older who has certain medical conditions and has not been previously immunized should receive 1 dose of PCV13 vaccine. This PCV13 should be followed with a dose of pneumococcal polysaccharide (PPSV23) vaccine. The PPSV23 vaccine dose should be obtained at least 8 weeks after the dose of PCV13 vaccine. An adult aged 19  years or older who has certain medical conditions and previously received 1 or more doses of PPSV23 vaccine should receive 1 dose of PCV13. The PCV13 vaccine dose should be obtained 1 or more years after the last PPSV23 vaccine dose.  Pneumococcal polysaccharide (PPSV23) vaccine. When PCV13 is also indicated, PCV13 should be obtained first. All adults aged 65 years and older should be immunized. An adult younger than age 65 years who has certain medical conditions should be immunized. Any person who resides in a nursing home or long-term care facility should be immunized. An adult smoker should be immunized. People with an immunocompromised condition and certain other conditions should receive both PCV13 and PPSV23 vaccines. People with human immunodeficiency virus (HIV) infection should be immunized as soon as possible after diagnosis. Immunization during chemotherapy or radiation therapy should be avoided. Routine use of PPSV23 vaccine is not recommended for American Indians, [place] Natives, or people younger than 65 years unless there are medical conditions that require PPSV23 vaccine. When indicated, people who have unknown immunization and have no record of immunization should receive PPSV23 vaccine. One-time revaccination 5 years after the first dose of PPSV23 is recommended for people aged 19-64 years who have chronic kidney failure, nephrotic syndrome, asplenia, or immunocompromised conditions. People who received 1-2 doses of PPSV23 before age 65 years should receive another dose of PPSV23 vaccine at age 65 years or later if at least 5 years have passed since the previous dose. Doses of PPSV23 are not needed for people immunized with PPSV23 at or after age 65 years.  Meningococcal vaccine. Adults with asplenia or persistent complement component deficiencies should receive 2 doses of quadrivalent meningococcal conjugate (MenACWY-D) vaccine. The doses should be obtained at least 2 months apart.  Microbiologists working with certain meningococcal bacteria, military recruits, people at risk during an outbreak, and people who travel to or live in countries with a high rate of meningitis should be immunized. A first-year college student up through age   21 years who is living in a residence hall should receive a dose if she did not receive a dose on or after her 16th birthday. Adults who have certain high-risk conditions should receive one or more doses of vaccine.  Hepatitis A vaccine. Adults who wish to be protected from this disease, have certain high-risk conditions, work with hepatitis A-infected animals, work in hepatitis A research labs, or travel to or work in countries with a high rate of hepatitis A should be immunized. Adults who were previously unvaccinated and who anticipate close contact with an international adoptee during the first 60 days after arrival in the [place] from a country with a high rate of hepatitis A should be immunized.  Hepatitis B vaccine. Adults who wish to be protected from this disease, have certain high-risk conditions, may be exposed to blood or other infectious body fluids, are household contacts or sex partners of hepatitis B positive people, are clients or workers in certain care facilities, or travel to or work in countries with a high rate of hepatitis B should be immunized.  Haemophilus influenzae type b (Hib) vaccine. A previously unvaccinated person with asplenia or sickle cell disease or having a scheduled splenectomy should receive 1 dose of Hib vaccine. Regardless of previous immunization, a recipient of a hematopoietic stem cell transplant should receive a 3-dose series 6-12 months after her successful transplant. Hib vaccine is not recommended for adults with HIV infection. Preventive Services / Frequency Ages 64 to 68 years  Blood pressure check.** / Every 1 to 2 years.  Lipid and cholesterol check.** / Every 5 years beginning at age  22.  Clinical breast exam.** / Every 3 years for women in their 88s and 53s.  BRCA-related cancer risk assessment.** / For women who have family members with a BRCA-related cancer (breast, ovarian, tubal, or peritoneal cancers).  Pap test.** / Every 2 years from ages 90 through 51. Every 3 years starting at age 21 through age 56 or 3 with a history of 3 consecutive normal Pap tests.  HPV screening.** / Every 3 years from ages 24 through ages 1 to 46 with a history of 3 consecutive normal Pap tests.  Hepatitis C blood test.** / For any individual with known risks for hepatitis C.  Skin self-exam. / Monthly.  Influenza vaccine. / Every year.  Tetanus, diphtheria, and acellular pertussis (Tdap, Td) vaccine.** / Consult your health care provider. Pregnant women should receive 1 dose of Tdap vaccine during each pregnancy. 1 dose of Td every 10 years.  Varicella vaccine.** / Consult your health care provider. Pregnant females who do not have evidence of immunity should receive the first dose after pregnancy.  HPV vaccine. / 3 doses over 6 months, if 72 and younger. The vaccine is not recommended for use in pregnant females. However, pregnancy testing is not needed before receiving a dose.  Measles, mumps, rubella (MMR) vaccine.** / You need at least 1 dose of MMR if you were born in 1957 or later. You may also need a 2nd dose. For females of childbearing age, rubella immunity should be determined. If there is no evidence of immunity, females who are not pregnant should be vaccinated. If there is no evidence of immunity, females who are pregnant should delay immunization until after pregnancy.  Pneumococcal 13-valent conjugate (PCV13) vaccine.** / Consult your health care provider.  Pneumococcal polysaccharide (PPSV23) vaccine.** / 1 to 2 doses if you smoke cigarettes or if you have certain conditions.  Meningococcal vaccine.** /  1 dose if you are age 19 to 21 years and a first-year college  student living in a residence hall, or have one of several medical conditions, you need to get vaccinated against meningococcal disease. You may also need additional booster doses.  Hepatitis A vaccine.** / Consult your health care provider.  Hepatitis B vaccine.** / Consult your health care provider.  Haemophilus influenzae type b (Hib) vaccine.** / Consult your health care provider. Ages 40 to 64 years  Blood pressure check.** / Every 1 to 2 years.  Lipid and cholesterol check.** / Every 5 years beginning at age 20 years.  Lung cancer screening. / Every year if you are aged 55-80 years and have a 30-pack-year history of smoking and currently smoke or have quit within the past 15 years. Yearly screening is stopped once you have quit smoking for at least 15 years or develop a health problem that would prevent you from having lung cancer treatment.  Clinical breast exam.** / Every year after age 40 years.  BRCA-related cancer risk assessment.** / For women who have family members with a BRCA-related cancer (breast, ovarian, tubal, or peritoneal cancers).  Mammogram.** / Every year beginning at age 40 years and continuing for as long as you are in good health. Consult with your health care provider.  Pap test.** / Every 3 years starting at age 30 years through age 65 or 70 years with a history of 3 consecutive normal Pap tests.  HPV screening.** / Every 3 years from ages 30 years through ages 65 to 70 years with a history of 3 consecutive normal Pap tests.  Fecal occult blood test (FOBT) of stool. / Every year beginning at age 50 years and continuing until age 75 years. You may not need to do this test if you get a colonoscopy every 10 years.  Flexible sigmoidoscopy or colonoscopy.** / Every 5 years for a flexible sigmoidoscopy or every 10 years for a colonoscopy beginning at age 50 years and continuing until age 75 years.  Hepatitis C blood test.** / For all people born from 1945 through  1965 and any individual with known risks for hepatitis C.  Skin self-exam. / Monthly.  Influenza vaccine. / Every year.  Tetanus, diphtheria, and acellular pertussis (Tdap/Td) vaccine.** / Consult your health care provider. Pregnant women should receive 1 dose of Tdap vaccine during each pregnancy. 1 dose of Td every 10 years.  Varicella vaccine.** / Consult your health care provider. Pregnant females who do not have evidence of immunity should receive the first dose after pregnancy.  Zoster vaccine.** / 1 dose for adults aged 60 years or older.  Measles, mumps, rubella (MMR) vaccine.** / You need at least 1 dose of MMR if you were born in 1957 or later. You may also need a 2nd dose. For females of childbearing age, rubella immunity should be determined. If there is no evidence of immunity, females who are not pregnant should be vaccinated. If there is no evidence of immunity, females who are pregnant should delay immunization until after pregnancy.  Pneumococcal 13-valent conjugate (PCV13) vaccine.** / Consult your health care provider.  Pneumococcal polysaccharide (PPSV23) vaccine.** / 1 to 2 doses if you smoke cigarettes or if you have certain conditions.  Meningococcal vaccine.** / Consult your health care provider.  Hepatitis A vaccine.** / Consult your health care provider.  Hepatitis B vaccine.** / Consult your health care provider.  Haemophilus influenzae type b (Hib) vaccine.** / Consult your health care provider. Ages 65   years and over  Blood pressure check.** / Every 1 to 2 years.  Lipid and cholesterol check.** / Every 5 years beginning at age 22 years.  Lung cancer screening. / Every year if you are aged 73-80 years and have a 30-pack-year history of smoking and currently smoke or have quit within the past 15 years. Yearly screening is stopped once you have quit smoking for at least 15 years or develop a health problem that would prevent you from having lung cancer  treatment.  Clinical breast exam.** / Every year after age 4 years.  BRCA-related cancer risk assessment.** / For women who have family members with a BRCA-related cancer (breast, ovarian, tubal, or peritoneal cancers).  Mammogram.** / Every year beginning at age 40 years and continuing for as long as you are in good health. Consult with your health care provider.  Pap test.** / Every 3 years starting at age 9 years through age 34 or 91 years with 3 consecutive normal Pap tests. Testing can be stopped between 65 and 70 years with 3 consecutive normal Pap tests and no abnormal Pap or HPV tests in the past 10 years.  HPV screening.** / Every 3 years from ages 57 years through ages 64 or 45 years with a history of 3 consecutive normal Pap tests. Testing can be stopped between 65 and 70 years with 3 consecutive normal Pap tests and no abnormal Pap or HPV tests in the past 10 years.  Fecal occult blood test (FOBT) of stool. / Every year beginning at age 15 years and continuing until age 17 years. You may not need to do this test if you get a colonoscopy every 10 years.  Flexible sigmoidoscopy or colonoscopy.** / Every 5 years for a flexible sigmoidoscopy or every 10 years for a colonoscopy beginning at age 86 years and continuing until age 71 years.  Hepatitis C blood test.** / For all people born from [birth year] through 1965 and any individual with known risks for hepatitis C.  Osteoporosis screening.** / A one-time screening for women ages 83 years and over and women at risk for fractures or osteoporosis.  Skin self-exam. / Monthly.  Influenza vaccine. / Every year.  Tetanus, diphtheria, and acellular pertussis (Tdap/Td) vaccine.** / 1 dose of Td every 10 years.  Varicella vaccine.** / Consult your health care provider.  Zoster vaccine.** / 1 dose for adults aged 61 years or older.  Pneumococcal 13-valent conjugate (PCV13) vaccine.** / Consult your health care provider.  Pneumococcal  polysaccharide (PPSV23) vaccine.** / 1 dose for all adults aged 28 years and older.  Meningococcal vaccine.** / Consult your health care provider.  Hepatitis A vaccine.** / Consult your health care provider.  Hepatitis B vaccine.** / Consult your health care provider.  Haemophilus influenzae type b (Hib) vaccine.** / Consult your health care provider. ** Family history and personal history of risk and conditions may change your health care provider's recommendations. Document Released: 03/27/2001 Document Revised: 06/15/2013 Document Reviewed: 06/26/2010 [facility] Patient Information 2015 [place], [place]. This information is not intended to replace advice given to you by your health care provider. Make sure you discuss any questions you have with your health care provider.

## 2014-10-25 NOTE — Addendum Note (Signed)
Addended by: Peggyann Shoals on: 10/25/2014 03:12 PM   Modules accepted: Orders

## 2014-10-25 NOTE — Progress Notes (Signed)
Subjective:     Rachel Wiley is a 39 y.o. female and is here for a comprehensive physical exam. The patient reports no problems.  Social History   Social History  . Marital Status: Married    Spouse Name: N/A  . Number of Children: N/A  . Years of Education: N/A   Occupational History  .      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 Smoking status: Never Smoker   . Smokeless tobacco: Not on file  . Alcohol Use: No  . Drug Use: No  . Sexual Activity: Yes   Other Topics Concern  . Not on file   Social History Narrative   Never Smoked   Alcohol use-no   Drug use-no   Smoking Status:  never   Drug Use:  No   Exercise--- walking            Health Maintenance  Topic Date Due  . INFLUENZA VACCINE  11/23/2014 (Originally 09/13/2014)  . PAP SMEAR  11/05/2016  . TETANUS/TDAP  06/18/2021  . HIV Screening  Completed    The following portions of the patient's history were reviewed and updated as appropriate:  She  has a past medical history of Anxiety; Palpitations; Other acute reactions to stress; Headache(784.0); Lipoma of unspecified site; Tachycardia; and SVD (spontaneous vaginal delivery) (06/18/2011). She  does not have any pertinent problems on file. She  has past surgical history that includes No past surgeries. Her family history includes Hypertension in her mother and another family member. There is no history of Anesthesia problems, Hypotension, Malignant hyperthermia, or Pseudochol deficiency. She  reports that she has never smoked. She does not have any smokeless tobacco history on file. She reports that she does not drink alcohol or use illicit drugs. She has a current medication list which includes the following prescription(s): cetirizine, fluticasone, and propranolol. Current Outpatient Prescriptions on File Prior to Visit  Medication Sig Dispense Refill  . cetirizine (ZYRTEC) 10 MG tablet Take 1 tablet (10 mg total) by mouth daily. 30 tablet 11   . fluticasone (FLONASE) 50 MCG/ACT nasal spray Place 2 sprays into both nostrils daily. 16 g 6  . propranolol (INDERAL) 20 MG tablet Pt to take one tablet once a day as needed. 30 tablet 2   No current facility-administered medications on file prior to visit.   She has No Known Allergies..  Review of Systems Review of Systems  Constitutional: Negative for activity change, appetite change and fatigue.  HENT: Negative for hearing loss, congestion, tinnitus and ear discharge.  dentist q40m Eyes: Negative for visual disturbance (see optho q1y -- )--no glasses Respiratory: Negative for cough, chest tightness and shortness of breath.   Cardiovascular: Negative for chest pain, palpitations and leg swelling.  Gastrointestinal: Negative for abdominal pain, diarrhea, constipation and abdominal distention.  Genitourinary: Negative for urgency, frequency, decreased urine volume and difficulty urinating.  Musculoskeletal: Negative for back pain, arthralgias and gait problem.  Skin: Negative for color change, pallor and rash.  Neurological: Negative for dizziness, light-headedness, numbness and headaches.  Hematological: Negative for adenopathy. Does not bruise/bleed easily.  Psychiatric/Behavioral: Negative for suicidal ideas, confusion, sleep disturbance, self-injury, dysphoric mood, decreased concentration and agitation.       Objective:    BP 110/68 mmHg  Pulse 75  Resp 16  Ht $R'5\' 4"'EF$  (1.626 m)  Wt 157 lb 12.8 oz (71.578 kg)  BMI 27.07 kg/m2  SpO2 99%  LMP 10/21/2014 General appearance: alert, cooperative,  appears stated age and no distress Head: Normocephalic, without obvious abnormality, atraumatic Eyes: conjunctivae/corneas clear. PERRL, EOM's intact. Fundi benign. Ears: normal TM's and external ear canals both ears Nose: Nares normal. Septum midline. Mucosa normal. No drainage or sinus tenderness. Throat: lips, mucosa, and tongue normal; teeth and gums normal Neck: no adenopathy,  no carotid bruit, no JVD, supple, symmetrical, trachea midline and thyroid not enlarged, symmetric, no tenderness/mass/nodules Back: symmetric, no curvature. ROM normal. No CVA tenderness. Lungs: clear to auscultation bilaterally Breasts: normal appearance, no masses or tenderness Heart: regular rate and rhythm, S1, S2 normal, no murmur, click, rub or gallop Abdomen: soft, non-tender; bowel sounds normal; no masses,  no organomegaly Pelvic: deferred--- on period Extremities: extremities normal, atraumatic, no cyanosis or edema Pulses: 2+ and symmetric Skin: Skin color, texture, turgor normal. No rashes or lesions Lymph nodes: Cervical, supraclavicular, and axillary nodes normal. Neurologic: Alert and oriented X 3, normal strength and tone. Normal symmetric reflexes. Normal coordination and gait Psych-no depression, no anxiety      Assessment:    Healthy female exam.      Plan:    ghm utd Check labs See After Visit Summary for Counseling Recommendations    1. Preventative health care   - CBC with Differential/Platelet - Comp Met (CMET) - Lipid panel - POCT urinalysis dipstick  2. Hair loss  Lab Results  Component Value Date   TSH 1.129 09/23/2014   T4TOTAL 8.5 09/23/2014   - Ambulatory referral to Dermatology

## 2014-10-25 NOTE — Progress Notes (Signed)
Pre visit review using our clinic review tool, if applicable. No additional management support is needed unless otherwise documented below in the visit note. 

## 2014-10-26 LAB — URINE CULTURE: Colony Count: 50000

## 2015-01-05 ENCOUNTER — Encounter: Payer: Self-pay | Admitting: *Deleted

## 2015-01-05 ENCOUNTER — Encounter: Payer: Self-pay | Admitting: Cardiovascular Disease

## 2015-01-05 ENCOUNTER — Ambulatory Visit (INDEPENDENT_AMBULATORY_CARE_PROVIDER_SITE_OTHER): Payer: BLUE CROSS/BLUE SHIELD | Admitting: Cardiovascular Disease

## 2015-01-05 VITALS — BP 128/82 | Ht 64.0 in | Wt 156.4 lb

## 2015-01-05 DIAGNOSIS — R002 Palpitations: Secondary | ICD-10-CM | POA: Diagnosis not present

## 2015-01-05 DIAGNOSIS — R079 Chest pain, unspecified: Secondary | ICD-10-CM

## 2015-01-05 DIAGNOSIS — R000 Tachycardia, unspecified: Secondary | ICD-10-CM | POA: Diagnosis not present

## 2015-01-05 MED ORDER — PROPRANOLOL HCL 20 MG PO TABS: 20.0000 mg | ORAL_TABLET | Freq: Every day | ORAL | Status: DC | PRN

## 2015-01-05 NOTE — Addendum Note (Signed)
Addended by: Gaetano Net on: 01/05/2015 09:52 AM   Modules accepted: Orders

## 2015-01-05 NOTE — Progress Notes (Signed)
Patient ID: Rachel Wiley, female   DOB: Jan 24, 1976, 39 y.o.   MRN: [id number] 39 y.o.  referred by Dr Etter Sjogren for palpitatins and dyspnea in 2012 . Improved with Inderal 40mg  but this was stopped a couple of years ago  Last couple of months occasional episodes of palpitations and noticing her HR. Seems to have a propensity for exaggerated HR response to exercise. Reviewed labs by Dr Etter Sjogren and normal with no anemia or thyroid disease. Some stress with living arrangements as her and her husband have been back and forth to [place]. No SSCP and mild dyspnea with walking. No stimulants and only 2 cups of tea /day. Has not had SSCP, presyncope, edema. Sometimes headaches have made her pulse faster.  Used minoxidil for hair growth 2 weeks Started having labile BP palpitations and chest pain with dizzyness Inderal would help some Just hasn't felt well.  Pain in chest fleeting sharp can be positional  Mild dyspnea   ROS: Denies fever, malais, weight loss, blurry vision, decreased visual acuity, cough, sputum, SOB, hemoptysis, pleuritic pain, palpitaitons, heartburn, abdominal pain, melena, lower extremity edema, claudication, or rash.  All other systems reviewed and negative  General: Affect appropriate Healthy:  appears stated age 39: normal Neck supple with no adenopathy JVP normal no bruits no thyromegaly Lungs clear with no wheezing and good diaphragmatic motion Heart:  S1/S2 no murmur, no rub, gallop or click PMI normal Abdomen: benighn, BS positve, no tenderness, no AAA no bruit.  No HSM or HJR Distal pulses intact with no bruits No edema Neuro non-focal Skin warm and dry No muscular weakness   Current Outpatient Prescriptions  Medication Sig Dispense Refill  . cetirizine (ZYRTEC) 10 MG tablet Take 10 mg by mouth daily as needed for allergies.    . fluticasone (FLONASE) 50 MCG/ACT nasal spray Place into both nostrils daily as needed for allergies or rhinitis.    Marland Kitchen propranolol (INDERAL) 20 MG  tablet Take 20 mg by mouth daily as needed (anxiety per pt).    Marland Kitchen omeprazole (PRILOSEC) 20 MG capsule Take 1 capsule by mouth as needed (INDIGESTIONI).   5   No current facility-administered medications for this visit.    Allergies  Review of patient's allergies indicates no known allergies.  Electrocardiogram:   12/2013  SR rate 71 normal   01/05/15  SR ate 73  Normal ( computer read [id number])    Assessment and Plan  Palpitations:benign sounding normal ECG observe PRN inderal  She has used minoxidal in her hair for 2 weeks before symptoms started And I suspect this may have ppt events Chest pain: atypical related to stress and palpitations will order stress echo since symptoms worrisome to patient and persistant GERD:  Continue prilosec low carb diet  Anxiety: some stress with work and caring for 39 year old and 53 yo  Husband travels with his work at [employer]  F/u primary

## 2015-01-05 NOTE — Patient Instructions (Addendum)
Medication Instructions:  Your physician recommends that you continue on your current medications as directed. Please refer to the Current Medication list given to you today.   Labwork: NONE ORDERED  Testing/Procedures: Your physician has requested that you have a stress echocardiogram. For further information please visit [email or web address]. Please follow instruction sheet as given.    Follow-Up: Your physician wants you to follow-up in: [place]. Johnsie Cancel.  You will receive a reminder letter in the mail two months in advance. If you don't receive a letter, please call our office to schedule the follow-up appointment.   Any Other Special Instructions Will Be Listed Below (If Applicable).   HAPPY THANKSGIVING  Exercise Stress Echocardiogram An exercise stress echocardiogram is a heart (cardiac) test used to check the function of your heart. This test may also be called an exercise stress echocardiography or stress echo. This stress test will check how well your heart muscle and valves are working and determine if your heart muscle is getting enough blood. You will exercise on a treadmill to naturally increase or stress the functioning of your heart.  An echocardiogram uses sound waves (ultrasound) to produce an image of your heart. If your heart does not work normally, it may indicate coronary artery disease with poor coronary blood supply. The coronary arteries are the arteries that bring blood and oxygen to your heart. LET [facility] CARE PROVIDER KNOW ABOUT:  Any allergies you have.  All medicines you are taking, including vitamins, herbs, eye drops, creams, and over-the-counter medicines.  Previous problems you or members of your family have had with the use of anesthetics.  Any blood disorders you have.  Previous surgeries you have had.  Medical conditions you have.  Possibility of pregnancy, if this applies. RISKS AND COMPLICATIONS Generally, this is a safe  procedure. However, as with any procedure, complications can occur. Possible complications can include:  You develop pain or pressure in the following areas:  Chest.  Jaw or neck.  Between your shoulder blades.  Radiating down your left arm.  Dizziness or lightheadedness.  Shortness of breath.  Increased or irregular heartbeat.  Nausea or vomiting.  Heart attack (rare). BEFORE THE PROCEDURE  Avoid all forms of caffeine for 24 hours before your test or as directed by your health care provider. This includes coffee, tea (even decaffeinated tea), caffeinated sodas, chocolate, cocoa, and certain pain medicines.  Follow your health care provider's instructions regarding eating and drinking before the test.  Take your medicines as directed at regular times with water unless instructed otherwise. Exceptions may include:  If you have diabetes, ask how you are to take your insulin or pills. It is common to adjust insulin dosing the morning of the test.  If you are taking beta-blocker medicines, it is important to talk to your health care provider about these medicines well before the date of your test. Taking beta-blocker medicines may interfere with the test. In some cases, these medicines need to be changed or stopped 24 hours or more before the test.  If you wear a nitroglycerin patch, it may need to be removed prior to the test. Ask your health care provider if the patch should be removed before the test.  If you use an inhaler for any breathing condition, bring it with you to the test.  If you are an outpatient, bring a snack so you can eat right after the stress phase of the test.  Do not smoke for 4 hours prior to  the test or as directed by your health care provider.  Wear loose-fitting clothes and comfortable shoes for the test. This test involves walking on a treadmill. PROCEDURE   Multiple electrodes will be put on your chest. If needed, small areas of your chest may be  shaved to get better contact with the electrodes. Once the electrodes are attached to your body, multiple wires will be attached to the electrodes, and your heart rate will be monitored.  You will have an echocardiogram done at rest.  To produce this image of your heart, gel is applied to your chest, and a wand-like tool (transducer) is moved over the chest. The transducer sends the sound waves through the chest to create the moving images of your heart.  You may need an IV to receive a medication that improves the quality of the pictures.  You will then walk on a treadmill. The treadmill will be started at a slow pace. The treadmill speed and incline will gradually be increased to raise your heart rate.  At the peak of exercise, the treadmill will be stopped. You will lie down immediately on a bed so that a second echocardiogram can be done to visualize your heart's motion with exercise.  The test usually takes 30-60 minutes to complete. AFTER THE PROCEDURE  Your heart rate and blood pressure will be monitored after the test.  You may return to your normal schedule, including diet, activities, and medicines, unless your health care provider tells you otherwise.   This information is not intended to replace advice given to you by your health care provider. Make sure you discuss any questions you have with your health care provider.   Document Released: 02/03/2004 Document Revised: 02/03/2013 Document Reviewed: 10/06/2012 Elsevier Interactive Patient Education [employer].   If you need a refill on your cardiac medications before your next appointment, please call your pharmacy.

## 2015-01-10 ENCOUNTER — Telehealth: Payer: Self-pay | Admitting: Family Medicine

## 2015-01-10 ENCOUNTER — Ambulatory Visit: Payer: BLUE CROSS/BLUE SHIELD | Admitting: Internal Medicine

## 2015-01-10 NOTE — Telephone Encounter (Signed)
Pt will be no show today 1:00pm. She is feeling better, charge or no charge?

## 2015-01-10 NOTE — Telephone Encounter (Signed)
No Charge 

## 2015-01-13 ENCOUNTER — Encounter: Payer: Self-pay | Admitting: Medical

## 2015-01-13 ENCOUNTER — Ambulatory Visit (INDEPENDENT_AMBULATORY_CARE_PROVIDER_SITE_OTHER): Payer: BLUE CROSS/BLUE SHIELD | Admitting: Medical

## 2015-01-13 VITALS — BP 118/76 | HR 85 | Temp 97.7°F | Ht 64.0 in | Wt 151.0 lb

## 2015-01-13 DIAGNOSIS — R5383 Other fatigue: Secondary | ICD-10-CM

## 2015-01-13 DIAGNOSIS — R002 Palpitations: Secondary | ICD-10-CM

## 2015-01-13 DIAGNOSIS — R112 Nausea with vomiting, unspecified: Secondary | ICD-10-CM | POA: Diagnosis not present

## 2015-01-13 DIAGNOSIS — F411 Generalized anxiety disorder: Secondary | ICD-10-CM

## 2015-01-13 DIAGNOSIS — K219 Gastro-esophageal reflux disease without esophagitis: Secondary | ICD-10-CM

## 2015-01-13 LAB — POCT URINE PREGNANCY: Preg Test, Ur: NEGATIVE

## 2015-01-13 MED ORDER — RANITIDINE HCL 150 MG PO CAPS: 150.0000 mg | ORAL_CAPSULE | Freq: Two times a day (BID) | ORAL | Status: DC

## 2015-01-13 MED ORDER — SERTRALINE HCL 25 MG PO TABS
25.0000 mg | ORAL_TABLET | Freq: Every day | ORAL | Status: DC
Start: 2015-01-13 — End: 2015-03-03

## 2015-01-13 NOTE — Progress Notes (Signed)
Pre visit review using our clinic review tool, if applicable. No additional management support is needed unless otherwise documented below in the visit note. 

## 2015-01-13 NOTE — Progress Notes (Signed)
Subjective:    Patient ID: Rachel Wiley, female    DOB: 1975/03/07, 39 y.o.   MRN: [id number]  HPI   Pt in states she has history of palpitations. Pt states in 2011 palpitation had while in [place]. B-blocker gave back then. Eventually pt seen by cardiology here as well(he agreed with occasional use of b-blocker on prn basis). She has been under care of cardiology with  check ups each year. Recent pal[pations over last 10 ays and saw cardiologist last Wednesday. Pt will have echo and stress test January 27, 2015. Pt never had a holter monitor. Last wed ekg was normal.  Pt had ekg every year with cardiologist and ekg normal.  Pt is today in am took propranol. Pt told by cardiologist she can take it one time  q day if she has symptoms.  LMP- December 19, 2014. Pt states regular cycles and has not skipped any cycles  Pt states cardiologist think she may be stressed emotionally.  Pt states some decreased appetite past month. Pt is fatgiued. Some reflux at times after she eats. Pt had seen ent in past who saw some evidence of reflux on laryngoscopy.   Review of Systems  Constitutional: Positive for fatigue. Negative for fever and chills.  Respiratory: Negative for cough, choking and wheezing.   Cardiovascular: Negative for chest pain and palpitations.  Gastrointestinal: Positive for abdominal pain.       Some gerd.  Endocrine: Negative for polydipsia, polyphagia and polyuria.  Genitourinary: Negative for dysuria and difficulty urinating.  Musculoskeletal: Negative for back pain.  Neurological: Negative for dizziness and headaches.  Hematological: Negative for adenopathy.  Psychiatric/Behavioral: Negative for behavioral problems and confusion.   Past Medical History  Diagnosis Date  . Anxiety   . Palpitations   . Other acute reactions to stress   . Headache(784.0)   . Lipoma of unspecified site   . Tachycardia   . SVD (spontaneous vaginal delivery) 06/18/2011    Social History    Social History  . Marital Status: Married    Spouse Name: N/A  . Number of Children: N/A  . Years of Education: N/A   Occupational History  .      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 Smoking status: Never Smoker   . Smokeless tobacco: Not on file  . Alcohol Use: No  . Drug Use: No  . Sexual Activity: Yes   Other Topics Concern  . Not on file   Social History Narrative   Never Smoked   Alcohol use-no   Drug use-no   Smoking Status:  never   Drug Use:  No   Exercise--- walking             Past Surgical History  Procedure Laterality Date  . No past surgeries      Family History  Problem Relation Age of Onset  . Hypertension    . Anesthesia problems Neg Hx   . Hypotension Neg Hx   . Malignant hyperthermia Neg Hx   . Pseudochol deficiency Neg Hx   . Hypertension Mother     No Known Allergies  Current Outpatient Prescriptions on File Prior to Visit  Medication Sig Dispense Refill  . fluticasone (FLONASE) 50 MCG/ACT nasal spray Place into both nostrils daily as needed for allergies or rhinitis.    Marland Kitchen propranolol (INDERAL) 20 MG tablet Take 1 tablet (20 mg total) by mouth daily as needed (anxiety per pt). 30 tablet 6  .  omeprazole (PRILOSEC) 20 MG capsule Take 1 capsule by mouth as needed (INDIGESTIONI).   5   No current facility-administered medications on file prior to visit.    BP 118/76 mmHg  Pulse 85  Temp(Src) 97.7 F (36.5 C) (Oral)  Ht 5\' 4"  (1.626 m)  Wt 151 lb (68.493 kg)  BMI 25.91 kg/m2  SpO2 100%       Objective:   Physical Exam  General Mental Status- Alert. General Appearance- Not in acute distress.   Skin General: Color- Normal Color. Moisture- Normal Moisture.  Neck Carotid Arteries- Normal color. Moisture- Normal Moisture. No carotid bruits. No JVD.  Chest and Lung Exam Auscultation: Breath Sounds:-Normal.  Cardiovascular Auscultation:Rythm- Regular. Murmurs & Other Heart Sounds:Auscultation of  the heart reveals- No Murmurs.  Abdomen Inspection:-Inspeection Normal. Palpation/Percussion:Note:No mass. Palpation and Percussion of the abdomen reveal- Non Tender, Non Distended + BS, no rebound or guarding.    Neurologic Cranial Nerve exam:- CN III-XII intact(No nystagmus), symmetric smile. Drift Test:- No drift. Romberg Exam:- Negative.  Heal to Toe Gait exam:-Normal. Finger to Nose:- Normal/Intact Strength:- 5/5 equal and symmetric strength both upper and lower extremities.      Assessment & Plan:  For your hx of palpitation would recommend you using the inderal as cardiologist recommended. Follow with cardiologist in mid December.  For reflux. Rx ranitidine.  Your work up for fatigue cbc,cmp and tsh was negative recently so not repeating.  Will rx sertraline as trial for potential anxiety in light of some stress recently and work up negative.   Follow up in 3-4 weeks or as needed.  ekg done today in our office same as last one done on 11-23 except our ekg had excess artifact.

## 2015-01-13 NOTE — Patient Instructions (Signed)
For your hx of palpitation would recommend you using the inderal as cardiologist recommended. Follow with cardiologist in mid December.  For reflux. Rx ranitidine.  Your work up for fatigue cbc,cmp and tsh was negative recently so not repeating.  Will rx sertraline as trial for potential anxiety in light of some stress recently and work up negative.   Follow up in 3-4 weeks or as needed.

## 2015-01-18 ENCOUNTER — Encounter: Payer: Self-pay | Admitting: Medical

## 2015-01-25 ENCOUNTER — Telehealth (HOSPITAL_COMMUNITY): Payer: Self-pay | Admitting: *Deleted

## 2015-01-25 NOTE — Telephone Encounter (Signed)
Patient given detailed instructions per Stress Test Requisition Sheet for test on 01/27/15 at 2:30.Patient Notified to arrive 30 minutes early, and that it is imperative to arrive on time for appointment to keep from having the test rescheduled.  Patient verbalized understanding. Rachel Wiley

## 2015-01-27 ENCOUNTER — Ambulatory Visit (HOSPITAL_COMMUNITY): Payer: BLUE CROSS/BLUE SHIELD | Attending: Cardiology

## 2015-01-27 ENCOUNTER — Ambulatory Visit (HOSPITAL_BASED_OUTPATIENT_CLINIC_OR_DEPARTMENT_OTHER): Payer: BLUE CROSS/BLUE SHIELD

## 2015-01-27 DIAGNOSIS — R079 Chest pain, unspecified: Secondary | ICD-10-CM | POA: Diagnosis not present

## 2015-01-27 DIAGNOSIS — R002 Palpitations: Secondary | ICD-10-CM | POA: Diagnosis not present

## 2015-01-27 DIAGNOSIS — R0989 Other specified symptoms and signs involving the circulatory and respiratory systems: Secondary | ICD-10-CM

## 2015-01-27 MED ORDER — PERFLUTREN LIPID MICROSPHERE
1.0000 mL | INTRAVENOUS | Status: AC | PRN
  Administered 2015-01-27: 6 mL via INTRAVENOUS

## 2015-01-28 LAB — ECHOCARDIOGRAM STRESS TEST
CHL CUP STRESS STAGE 1 DBP: 85 mmHg
CHL CUP STRESS STAGE 1 HR: 107 {beats}/min
CHL CUP STRESS STAGE 1 SBP: 126 mmHg
CHL CUP STRESS STAGE 2 GRADE: 0 %
CHL CUP STRESS STAGE 2 SPEED: 0 mph
CHL CUP STRESS STAGE 3 DBP: 80 mmHg
CHL CUP STRESS STAGE 3 GRADE: 10 %
CHL CUP STRESS STAGE 4 SBP: 168 mmHg
CHL CUP STRESS STAGE 4 SPEED: 2.5 mph
CHL CUP STRESS STAGE 5 DBP: 61 mmHg
CHL CUP STRESS STAGE 5 HR: 184 {beats}/min
CHL CUP STRESS STAGE 7 DBP: 69 mmHg
CHL CUP STRESS STAGE 8 GRADE: 0 %
CHL CUP STRESS STAGE 8 SPEED: 0 mph
CSEPPMHR: 98 %
Estimated workload: 1 METS
Peak HR: 179 {beats}/min
Stage 1 Grade: 0 %
Stage 1 Speed: 0 mph
Stage 2 HR: 106 {beats}/min
Stage 3 HR: 136 {beats}/min
Stage 3 SBP: 153 mmHg
Stage 3 Speed: 1.7 mph
Stage 4 DBP: 79 mmHg
Stage 4 Grade: 12 %
Stage 4 HR: 157 {beats}/min
Stage 5 SBP: 78 mmHg
Stage 6 HR: 179 {beats}/min
Stage 7 Grade: 9 %
Stage 7 HR: 146 {beats}/min
Stage 7 SBP: 132 mmHg
Stage 7 Speed: 0 mph
Stage 8 DBP: 70 mmHg
Stage 8 HR: 116 {beats}/min
Stage 8 SBP: 106 mmHg

## 2015-03-03 ENCOUNTER — Encounter: Payer: Self-pay | Admitting: Family Medicine

## 2015-03-03 ENCOUNTER — Other Ambulatory Visit (HOSPITAL_COMMUNITY)
Admission: RE | Admit: 2015-03-03 | Discharge: 2015-03-03 | Disposition: A | Discharge status: Home / Self Care | Payer: BLUE CROSS/BLUE SHIELD | Source: Ambulatory Visit | Attending: Family Medicine | Admitting: Family Medicine

## 2015-03-03 ENCOUNTER — Ambulatory Visit (INDEPENDENT_AMBULATORY_CARE_PROVIDER_SITE_OTHER): Payer: BLUE CROSS/BLUE SHIELD | Admitting: Family Medicine

## 2015-03-03 VITALS — BP 110/62 | HR 83 | Temp 98.0°F | Wt 156.0 lb

## 2015-03-03 DIAGNOSIS — N644 Mastodynia: Secondary | ICD-10-CM | POA: Diagnosis not present

## 2015-03-03 DIAGNOSIS — N760 Acute vaginitis: Secondary | ICD-10-CM

## 2015-03-03 DIAGNOSIS — K219 Gastro-esophageal reflux disease without esophagitis: Secondary | ICD-10-CM | POA: Diagnosis not present

## 2015-03-03 LAB — COMPREHENSIVE METABOLIC PANEL
ALBUMIN: 4 g/dL (ref 3.5–5.2)
ALK PHOS: 52 U/L (ref 39–117)
ALT: 12 U/L (ref 0–35)
AST: 16 U/L (ref 0–37)
BILIRUBIN TOTAL: 0.2 mg/dL (ref 0.2–1.2)
BUN: 11 mg/dL (ref 6–23)
CALCIUM: 9.3 mg/dL (ref 8.4–10.5)
CO2: 29 mEq/L (ref 19–32)
CREATININE: 0.78 mg/dL (ref 0.40–1.20)
Chloride: 103 mEq/L (ref 96–112)
GFR: 87.29 mL/min (ref >60.00)
Glucose, Bld: 85 mg/dL (ref 70–99)
Potassium: 4.1 mEq/L (ref 3.5–5.1)
Sodium: 138 mEq/L (ref 135–145)
TOTAL PROTEIN: 7.3 g/dL (ref 6.0–8.3)

## 2015-03-03 LAB — HEMOGLOBIN A1C: HEMOGLOBIN A1C: 5.3 % (ref 4.6–6.5)

## 2015-03-03 MED ORDER — RANITIDINE HCL 150 MG PO CAPS: 150.0000 mg | ORAL_CAPSULE | Freq: Two times a day (BID) | ORAL | Status: DC

## 2015-03-03 NOTE — Progress Notes (Signed)
Patient ID: Rachel Wiley, female    DOB: April 17, 1975  Age: 40 y.o. MRN: [id number]    Subjective:  Subjective HPI [name] presents for f/u gerd.  She only took one dose of ranitidine yesterday.  She has had symptoms since Nov --she was seen in Nov but was upset about being given zoloft and did not take meds.  Pt c/o burping and regurgitations --- esp with any kind of meat or poultry.   Pt also c/o vaginal d/c x 1 month that is yellow,, no odor.  She always has a d/c but it is different now.    Pt also c/o L breast pain that comes and goes along low part of breast.  She has had a cardiac w/u-- all neg.    Review of Systems  Constitutional: Negative for diaphoresis, appetite change, fatigue and unexpected weight change.  Eyes: Negative for pain, redness and visual disturbance.  Respiratory: Negative for cough, chest tightness, shortness of breath and wheezing.   Cardiovascular: Negative for chest pain, palpitations and leg swelling.  Gastrointestinal: Negative for nausea.       + gas, burping  Endocrine: Negative for cold intolerance, heat intolerance, polydipsia, polyphagia and polyuria.  Genitourinary: Positive for vaginal discharge. Negative for dysuria, urgency, frequency, decreased urine volume, vaginal bleeding, difficulty urinating, vaginal pain, menstrual problem and pelvic pain.  Neurological: Negative for dizziness, light-headedness, numbness and headaches.    History Past Medical History  Diagnosis Date  . Anxiety   . Palpitations   . Other acute reactions to stress   . Headache(784.0)   . Lipoma of unspecified site   . Tachycardia   . SVD (spontaneous vaginal delivery) 06/18/2011    She has past surgical history that includes No past surgeries.   Her family history includes Hypertension in her mother. There is no history of Anesthesia problems, Hypotension, Malignant hyperthermia, or Pseudochol deficiency.She reports that she has never smoked. She does not have any  smokeless tobacco history on file. She reports that she does not drink alcohol or use illicit drugs.  Current Outpatient Prescriptions on File Prior to Visit  Medication Sig Dispense Refill  . propranolol (INDERAL) 20 MG tablet Take 1 tablet (20 mg total) by mouth daily as needed (anxiety per pt). 30 tablet 6   No current facility-administered medications on file prior to visit.     Objective:  Objective Physical Exam  Constitutional: She is oriented to person, place, and time. She appears well-developed and well-nourished.  HENT:  Head: Normocephalic and atraumatic.  Eyes: Conjunctivae and EOM are normal.  Neck: Normal range of motion. Neck supple. No JVD present. Carotid bruit is not present. No thyromegaly present.  Cardiovascular: Normal rate, regular rhythm and normal heart sounds.   No murmur heard. Pulmonary/Chest: Effort normal and breath sounds normal. No respiratory distress. She has no wheezes. She has no rales. She exhibits no tenderness.    Musculoskeletal: She exhibits no edema.  Neurological: She is alert and oriented to person, place, and time.  Psychiatric: She has a normal mood and affect.   BP 110/62 mmHg  Pulse 83  Temp(Src) 98 F (36.7 C) (Oral)  Wt 156 lb (70.761 kg)  SpO2 98%  LMP 02/19/2015 Wt Readings from Last 3 Encounters:  03/03/15 156 lb (70.761 kg)  01/13/15 151 lb (68.493 kg)  01/05/15 156 lb 6.4 oz (70.943 kg)     Lab Results  Component Value Date   WBC 7.6 10/25/2014   HGB 12.1 10/25/2014  HCT 37.0 10/25/2014   PLT 179.0 10/25/2014   GLUCOSE 85 03/03/2015   CHOL 141 10/25/2014   TRIG 113.0 10/25/2014   HDL 31.70* 10/25/2014   LDLCALC 86 10/25/2014   ALT 12 03/03/2015   AST 16 03/03/2015   NA 138 03/03/2015   K 4.1 03/03/2015   CL 103 03/03/2015   CREATININE 0.78 03/03/2015   BUN 11 03/03/2015   CO2 29 03/03/2015   TSH 1.129 09/23/2014   HGBA1C 5.3 03/03/2015    US Soft Tissue Head/neck  09/25/2014  CLINICAL DATA:   40 year old female with thyromegaly EXAM: THYROID ULTRASOUND TECHNIQUE: Ultrasound examination of the thyroid gland and adjacent soft tissues was performed. COMPARISON:  None. FINDINGS: Right thyroid lobe Measurements: 5.9 x 1.6 x 1.6 cm. Relatively homogeneous appearance of the thyroid gland. Incidental note is made of a tiny 2 mm hypoechoic nodule in the lower pole. Left thyroid lobe Measurements: 5.4 x 1.5 x 1.7 cm.  No nodules visualized. Isthmus Thickness: 0.2 cm.  No nodules visualized. Lymphadenopathy None visualized. IMPRESSION: 1. The thyroid gland is at the upper limits of normal for size but otherwise unremarkable in sonographic appearance. 2. Incidental note is made of a tiny 2 mm nodule or cyst in the right lower gland. Electronically Signed   By: Jacqulynn Cadet M.D.   On: 09/25/2014 13:22  Notes Recorded by Michaelyn Barter, RN on 01/28/2015 at 8:08 AM Called patient about stress echo. Per Dr. Johnsie Cancel, normal stress echo. Patient verbalized understanding. ------  Notes Recorded by Josue Hector, MD on 01/27/2015 at 6:10 PM Normal stress echo    PACS Images    Show images for Echo stress    Narrative                [facility]            [address], [place] [address]              [phone]  ------------------------------------------------------------------- Stress Echocardiography  Patient:  Rachel, Wiley MR #:    [id number] Study Date: 01/27/2015 Gender:   F Age:    31 Height:   162.6 cm Weight:   68.6 kg BSA:    1.77 m^2 Pt. Status: Room:  Rachel Wiley, M.D. ATTENDING  Rachel Wiley SONOGRAPHER Marygrace Drought, RCS PERFORMING  [facility], Outpatient  cc:  -------------------------------------------------------------------  ------------------------------------------------------------------- Indications:   Chest Pain  (R07.9).  ------------------------------------------------------------------- History:  PMH: Palpitations, SOB.  ------------------------------------------------------------------- Study Conclusions  - Stress ECG conclusions: The stress ECG was negative for ischemia.  Impressions:  - Normal study after maximal exercise.  Bruce protocol. Stress echocardiography. Birthdate: Patient birthdate: 09-Jul-1975. Age: Patient is 40 yr old. Sex: Gender: female.  BMI: 26 kg/m^2. Blood pressure:   126/85 Patient status: Outpatient. Study date: Study date: 01/27/2015. Study time: 03:36 PM.  -------------------------------------------------------------------  -------------------------------------------------------------------      Assessment & Plan:  Plan I have discontinued Ms. Scurlock's omeprazole, fluticasone, and sertraline. I am also having her maintain her propranolol and ranitidine.  Meds ordered this encounter  Medications  . ranitidine (ZANTAC) 150 MG capsule    Sig: Take 1 capsule (150 mg total) by mouth 2 (two) times daily.    Dispense:  60 capsule    Refill:  0    Problem List Items Addressed This Visit      Unprioritized   Vaginitis and vulvovaginitis  Wet prep No odor Pt normally does have some d/c Await culture      Relevant Orders   POCT urinalysis dipstick   Cervicovaginal ancillary only    Other Visit Diagnoses    Gastroesophageal reflux disease, esophagitis presence not specified    -  Primary    Relevant Medications    ranitidine (ZANTAC) 150 MG capsule    Other Relevant Orders    Comp Met (CMET) (Completed)    Hemoglobin A1c (Completed)    H. pylori antibody, IgG    Breast pain, left        Relevant Orders    MM Digital Diagnostic Bilat       Follow-up: Return if symptoms worsen or fail to improve.  Garnet Koyanagi, DO

## 2015-03-03 NOTE — Assessment & Plan Note (Signed)
Wet prep No odor Pt normally does have some d/c Await culture

## 2015-03-03 NOTE — Progress Notes (Signed)
Pre visit review using our clinic review tool, if applicable. No additional management support is needed unless otherwise documented below in the visit note. 

## 2015-03-03 NOTE — Patient Instructions (Signed)
Food Choices for Gastroesophageal Reflux Disease, Adult When you have gastroesophageal reflux disease (GERD), the foods you eat and your eating habits are very important. Choosing the right foods can help ease the discomfort of GERD. WHAT GENERAL GUIDELINES DO I NEED TO FOLLOW?  Choose fruits, vegetables, whole grains, low-fat dairy products, and low-fat meat, fish, and poultry.  Limit fats such as oils, salad dressings, butter, nuts, and avocado.  Keep a food diary to identify foods that cause symptoms.  Avoid foods that cause reflux. These may be different for different people.  Eat frequent small meals instead of three large meals each day.  Eat your meals slowly, in a relaxed setting.  Limit fried foods.  Cook foods using methods other than frying.  Avoid drinking alcohol.  Avoid drinking large amounts of liquids with your meals.  Avoid bending over or lying down until 2-3 hours after eating. WHAT FOODS ARE NOT RECOMMENDED? The following are some foods and drinks that may worsen your symptoms: Vegetables Tomatoes. Tomato juice. Tomato and spaghetti sauce. Chili peppers. Onion and garlic. Horseradish. Fruits Oranges, grapefruit, and lemon (fruit and juice). Meats High-fat meats, fish, and poultry. This includes hot dogs, ribs, ham, sausage, salami, and bacon. Dairy Whole milk and chocolate milk. Sour cream. Cream. Butter. Ice cream. Cream cheese.  Beverages Coffee and tea, with or without caffeine. Carbonated beverages or energy drinks. Condiments Hot sauce. Barbecue sauce.  Sweets/Desserts Chocolate and cocoa. Donuts. Peppermint and spearmint. Fats and Oils High-fat foods, including French fries and potato chips. Other Vinegar. Strong spices, such as black pepper, white pepper, red pepper, cayenne, curry powder, cloves, ginger, and chili powder. The items listed above may not be a complete list of foods and beverages to avoid. Contact your dietitian for more  information.   This information is not intended to replace advice given to you by your health care provider. Make sure you discuss any questions you have with your health care provider.   Document Released: 01/29/2005 Document Revised: 02/19/2014 Document Reviewed: 12/03/2012 Elsevier Interactive Patient Education 2016 [employer]vier Inc.  

## 2015-03-04 ENCOUNTER — Other Ambulatory Visit (INDEPENDENT_AMBULATORY_CARE_PROVIDER_SITE_OTHER): Payer: BLUE CROSS/BLUE SHIELD

## 2015-03-04 DIAGNOSIS — K219 Gastro-esophageal reflux disease without esophagitis: Secondary | ICD-10-CM

## 2015-03-04 LAB — CERVICOVAGINAL ANCILLARY ONLY: WET PREP (BD AFFIRM): NEGATIVE

## 2015-03-04 LAB — H. PYLORI ANTIBODY, IGG: H PYLORI IGG: NEGATIVE

## 2015-03-08 ENCOUNTER — Other Ambulatory Visit: Payer: Self-pay

## 2015-03-08 ENCOUNTER — Other Ambulatory Visit: Payer: Self-pay | Admitting: Family Medicine

## 2015-03-08 DIAGNOSIS — N644 Mastodynia: Secondary | ICD-10-CM

## 2015-03-14 ENCOUNTER — Ambulatory Visit
Admission: RE | Admit: 2015-03-14 | Discharge: 2015-03-14 | Disposition: A | Discharge status: Home / Self Care | Payer: BLUE CROSS/BLUE SHIELD | Source: Ambulatory Visit | Attending: Family Medicine | Admitting: Family Medicine

## 2015-03-14 ENCOUNTER — Ambulatory Visit
Admission: RE | Admit: 2015-03-14 | Discharge: 2015-03-14 | Disposition: A | Discharge status: Home / Self Care | Payer: BLUE CROSS/BLUE SHIELD | Source: Ambulatory Visit

## 2015-03-14 DIAGNOSIS — N644 Mastodynia: Secondary | ICD-10-CM

## 2015-05-05 ENCOUNTER — Ambulatory Visit (INDEPENDENT_AMBULATORY_CARE_PROVIDER_SITE_OTHER): Payer: BLUE CROSS/BLUE SHIELD | Admitting: Internal Medicine

## 2015-05-05 ENCOUNTER — Encounter: Payer: Self-pay | Admitting: Internal Medicine

## 2015-05-05 VITALS — BP 116/74 | HR 80 | Temp 98.1°F | Ht 64.0 in | Wt 153.5 lb

## 2015-05-05 DIAGNOSIS — J0100 Acute maxillary sinusitis, unspecified: Secondary | ICD-10-CM | POA: Diagnosis not present

## 2015-05-05 MED ORDER — AMOXICILLIN 875 MG PO TABS: 875.0000 mg | ORAL_TABLET | Freq: Two times a day (BID) | ORAL | Status: DC

## 2015-05-05 MED ORDER — AZELASTINE HCL 0.1 % NA SOLN: 2.0000 | Freq: Every evening | NASAL | Status: DC | PRN

## 2015-05-05 NOTE — Progress Notes (Signed)
Subjective:    Patient ID: Rachel Wiley, female    DOB: [birth year], 40 y.o.   MRN: [id number]  DOS:  05/05/2015 Type of visit - description : Acute visit Interval history: Symptoms started 2 weeks ago with sore throat, cough. The sore throat resolved after 4-5 days but she still coughing, usually green discharge she feels is coming from the sinuses. + Postnasal dripping, sinus pain. Taking Zyrtec and Flonase.   Review of Systems  No fever chills No chest pain or difficulty breathing No nausea vomiting Has been constipated for 2 or 3 weeks (since she started Zyrtec) Past Medical History  Diagnosis Date  . Anxiety   . Palpitations   . Other acute reactions to stress   . Headache(784.0)   . Lipoma of unspecified site   . Tachycardia   . SVD (spontaneous vaginal delivery) 06/18/2011    Past Surgical History  Procedure Laterality Date  . No past surgeries      Social History   Social History  . Marital Status: Married    Spouse Name: N/A  . Number of Children: N/A  . Years of Education: N/A   Occupational History  .      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 Smoking status: Never Smoker   . Smokeless tobacco: Not on file  . Alcohol Use: No  . Drug Use: No  . Sexual Activity: Yes   Other Topics Concern  . Not on file   Social History Narrative   Never Smoked   Alcohol use-no   Drug use-no   Smoking Status:  never   Drug Use:  No   Exercise--- walking                 Medication List       This list is accurate as of: 05/05/15  3:27 PM.  Always use your most recent med list.               amoxicillin 875 MG tablet  Commonly known as:  AMOXIL  Take 1 tablet (875 mg total) by mouth 2 (two) times daily.     azelastine 0.1 % nasal spray  Commonly known as:  ASTELIN  Place 2 sprays into both nostrils at bedtime as needed for rhinitis. Use in each nostril as directed     propranolol 20 MG tablet  Commonly known as:  INDERAL    Take 1 tablet (20 mg total) by mouth daily as needed (anxiety per pt).     ranitidine 150 MG capsule  Commonly known as:  ZANTAC  Take 1 capsule (150 mg total) by mouth 2 (two) times daily.           Objective:   Physical Exam BP 116/74 mmHg  Pulse 80  Temp(Src) 98.1 F (36.7 C) (Oral)  Ht 5\' 4"  (1.626 m)  Wt 153 lb 8 oz (69.627 kg)  BMI 26.34 kg/m2  SpO2 99%  LMP 04/17/2015 (Exact Date) General:   Well developed, well nourished . NAD.  HEENT:  Normocephalic . Face symmetric, atraumatic. TMs bulge, no red. Nose congested. Throat symmetric. Sinuses: Slightly TTP on the left Lungs:  CTA B Normal respiratory effort, no intercostal retractions, no accessory muscle use. Heart: RRR,  no murmur.  No pretibial edema bilaterally  Skin: Not pale. Not jaundice Neurologic:  alert & oriented X3.  Speech normal, gait appropriate for age and unassisted Psych--  Cognition and judgment appear intact.  Cooperative  with normal attention span and concentration.  Behavior appropriate. No anxious or depressed appearing.      Assessment & Plan:   Symptoms consistent with sinusitis: Amoxicillin, Flonase and Astelin. She got constipated after she started Zyrtec, there is likely relationship. Stop Zyrtec. Okay to use Flonase and Astelin long-term for allergy control as well if needed . See AVS

## 2015-05-05 NOTE — Progress Notes (Signed)
Pre visit review using our clinic review tool, if applicable. No additional management support is needed unless otherwise documented below in the visit note. 

## 2015-05-05 NOTE — Patient Instructions (Signed)
Rest, fluids , tylenol  For cough:  Take Mucinex DM twice a day as needed until better  For nasal congestion: Use OTC Nasocort or Flonase : 2 nasal sprays on each side of the nose in the morning until you feel better Use ASTELIN a prescribed spray : 2 nasal sprays on each side of the nose at night until you feel better   Avoid decongestants such as  Pseudoephedrine or phenylephrine      Take the antibiotic as prescribed  (Amoxicillin)  Call if not gradually better over the next  10 days  Call anytime if the symptoms are severe 

## 2015-07-02 NOTE — Progress Notes (Signed)
Patient ID: Rachel Wiley, female   DOB: 14-Apr-1975, 40 y.o.   MRN: [id number] 40 y.o.  referred by Dr Etter Sjogren for palpitatins and dyspnea in 2012 . Improved with Inderal 40mg  but this was stopped a couple of years ago  Last couple of months occasional episodes of palpitations and noticing her HR. Seems to have a propensity for exaggerated HR response to exercise. Reviewed labs by Dr Etter Sjogren and normal with no anemia or thyroid disease. Some stress with living arrangements as her and her husband have been back and forth to [place]. No SSCP and mild dyspnea with walking. No stimulants and only 2 cups of tea /day. Has not had SSCP, presyncope, edema. Sometimes headaches have made her pulse faster.  Used minoxidil for hair growth 2 weeks Started having labile BP palpitations and chest pain with dizzyness Inderal would help some Just hasn't felt well.  Pain in chest fleeting sharp can be positional  Mild dyspnea In retrospect also appears that zyrtec made her BP and pulse go up   01/2015 had normal stress echo  ROS: Denies fever, malais, weight loss, blurry vision, decreased visual acuity, cough, sputum, SOB, hemoptysis, pleuritic pain, palpitaitons, heartburn, abdominal pain, melena, lower extremity edema, claudication, or rash.  All other systems reviewed and negative  General: Affect appropriate Healthy:  appears stated age 35: normal Neck supple with no adenopathy JVP normal no bruits no thyromegaly Lungs clear with no wheezing and good diaphragmatic motion Heart:  S1/S2 no murmur, no rub, gallop or click PMI normal Abdomen: benighn, BS positve, no tenderness, no AAA no bruit.  No HSM or HJR Distal pulses intact with no bruits No edema Neuro non-focal Skin warm and dry No muscular weakness   Current Outpatient Prescriptions  Medication Sig Dispense Refill  . propranolol (INDERAL) 20 MG tablet Take 1 tablet (20 mg total) by mouth daily as needed (anxiety per pt). 30 tablet 6  . ranitidine  (ZANTAC) 150 MG capsule Take 150 mg by mouth daily as needed for heartburn.     No current facility-administered medications for this visit.    Allergies  Review of patient's allergies indicates no known allergies.  Electrocardiogram:   12/2013  SR rate 71 normal   01/05/15  SR ate 73  Normal ( computer read [id number])    Assessment and Plan  Palpitations:benign sounding normal ECG observe PRN inderal  Possibly related to zyrtec and minoxidil  Chest pain: atypical related to stress and palpitations normal stress echo 01/27/15  GERD:  Continue prilosec low carb diet  Anxiety: some stress with work and caring for 40 year old and 82 yo  Husband travels with his work at [employer]  F/u primary    [employer]

## 2015-07-05 ENCOUNTER — Encounter: Payer: Self-pay | Admitting: Cardiovascular Disease

## 2015-07-05 ENCOUNTER — Ambulatory Visit (INDEPENDENT_AMBULATORY_CARE_PROVIDER_SITE_OTHER): Payer: BLUE CROSS/BLUE SHIELD | Admitting: Cardiovascular Disease

## 2015-07-05 VITALS — BP 130/80 | HR 84 | Ht 64.0 in | Wt 153.1 lb

## 2015-07-05 DIAGNOSIS — R002 Palpitations: Secondary | ICD-10-CM

## 2015-07-05 NOTE — Patient Instructions (Addendum)

## 2015-09-07 ENCOUNTER — Ambulatory Visit (INDEPENDENT_AMBULATORY_CARE_PROVIDER_SITE_OTHER): Payer: BLUE CROSS/BLUE SHIELD | Admitting: Family Medicine

## 2015-09-07 ENCOUNTER — Encounter: Payer: Self-pay | Admitting: Family Medicine

## 2015-09-07 VITALS — BP 125/72 | HR 76 | Temp 98.4°F | Ht 64.0 in | Wt 157.4 lb

## 2015-09-07 DIAGNOSIS — R21 Rash and other nonspecific skin eruption: Secondary | ICD-10-CM

## 2015-09-07 DIAGNOSIS — L299 Pruritus, unspecified: Secondary | ICD-10-CM

## 2015-09-07 DIAGNOSIS — T148 Other injury of unspecified body region: Secondary | ICD-10-CM

## 2015-09-07 DIAGNOSIS — W57XXXA Bitten or stung by nonvenomous insect and other nonvenomous arthropods, initial encounter: Secondary | ICD-10-CM | POA: Diagnosis not present

## 2015-09-07 MED ORDER — TRIAMCINOLONE ACETONIDE 0.1 % EX CREA: 1.0000 "application " | TOPICAL_CREAM | Freq: Two times a day (BID) | CUTANEOUS | 0 refills | Status: DC

## 2015-09-07 NOTE — Progress Notes (Signed)
Pre visit review using our clinic review tool, if applicable. No additional management support is needed unless otherwise documented below in the visit note. 

## 2015-09-07 NOTE — Patient Instructions (Signed)
I think that your itchy spots are due to an insect bite which may have occurred when you were gardening. It may also be due to contact with a plant; in either case, I think it will clear up completely without causing any serious problems.   Use the triamcinolone steroid cream if needed for itching- you can use this up to twice a day.    Let me know if you have any other concerns or symptoms

## 2015-09-07 NOTE — Progress Notes (Signed)
[place] [facility] [address] [place], [place] [address] [phone]  Date:  09/07/2015   Name:  Rachel Wiley   DOB:  06/27/1975   MRN:  [id number]  PCP:  [name], DO    Chief Complaint: Rash (c/o rash on right ankle and back. Ankle it itchy but not back. )   History of Present Illness:  Rachel Wiley is a 40 y.o. very pleasant female patient who presents with the following:  Here today with complaint of an itchy rash. She first noted it about 4 days ago.   She first noted it on her right ankle, and then a couple of spots showed up on her left shin and left arm, and a few on her back . However the areas on her legs itch the most No pain.  No hives or swelling, no fever.  She generally feels well. No nausea or vomiting.  No headaches, no neurological changes.  No other rash, body aches or chills She has a husband and 2 children- no one else in her family has any sx She does tend to have some skin break outs during the hot weather.   She is generally in good health She is using an OTC camphor cooling gel that does help.   She was gardening the night prior to noticing the rash.    LMP 6/29  Patient Active Problem List   Diagnosis Date Noted  . Vaginitis and vulvovaginitis 03/03/2015  . Plantar fasciitis 05/15/2012  . Pre-ulcerative corn or callous 05/15/2012  . External hemorrhoid, bleeding 11/28/2011  . Anal fissure 11/28/2011  . SVD (spontaneous vaginal delivery) 06/18/2011  . Tachycardia 03/15/2011  . URI (upper respiratory infection) 02/07/2011  . LIPOMA 04/28/2010  . OTHER ACUTE REACTIONS TO STRESS 04/28/2010  . HEADACHE 04/28/2010  . PALPITATIONS, RECURRENT 04/28/2010    Past Medical History:  Diagnosis Date  . Anxiety   . Headache(784.0)   . Lipoma of unspecified site   . Other acute reactions to stress   . Palpitations   . SVD (spontaneous vaginal delivery) 06/18/2011  . Tachycardia     Past  Surgical History:  Procedure Laterality Date  . NO PAST SURGERIES      Social History  Substance Use Topics  . Smoking status: Never Smoker  . Smokeless tobacco: Not on file  . Alcohol use No    Family History  Problem Relation Age of Onset  . Hypertension Mother   . Hypertension    . Anesthesia problems Neg Hx   . Hypotension Neg Hx   . Malignant hyperthermia Neg Hx   . Pseudochol deficiency Neg Hx     Allergies  Allergen Reactions  . Cetirizine & Related Palpitations    Per pt    Medication list has been reviewed and updated.  Current Outpatient Prescriptions on File Prior to Visit  Medication Sig Dispense Refill  . propranolol (INDERAL) 20 MG tablet Take 1 tablet (20 mg total) by mouth daily as needed (anxiety per pt). 30 tablet 6  . ranitidine (ZANTAC) 150 MG capsule Take 150 mg by mouth daily as needed for heartburn.     No current facility-administered medications on file prior to visit.     Review of Systems:  As per HPI- otherwise negative.   Physical Examination: Vitals:   09/07/15 1638  BP: 125/72  Pulse: 76  Temp: 98.4 F (36.9 C)   Vitals:  09/07/15 1638  Weight: 157 lb 6.4 oz (71.4 kg)  Height: 5\' 4"  (1.626 m)   Body mass index is 27.02 kg/m. Ideal Body Weight: Weight in (lb) to have BMI = 25: 145.3  GEN: WDWN, NAD, Non-toxic, A & O x 3, mild overweight, looks well   HEENT: Atraumatic, Normocephalic. Neck supple. No masses, No LAD.  Bilateral TM wnl, oropharynx normal.  PEERL,EOMI.   No oral lesions Ears and Nose: No external deformity. CV: RRR, No M/G/R. No JVD. No thrill. No extra heart sounds. PULM: CTA B, no wheezes, crackles, rhonchi. No retractions. No resp. distress. No accessory muscle use. EXTR: No c/c/e NEURO Normal gait.  PSYCH: Normally interactive. Conversant. Not depressed or anxious appearing.  Calm demeanor.  Few discrete erythematous lesions consistent with insect bites on her right ankle, and one on the left shin. No  other rashes or lesions noted    Assessment and Plan: Rash and nonspecific skin eruption - Plan: triamcinolone cream (KENALOG) 0.1 %  Itching - Plan: triamcinolone cream (KENALOG) 0.1 %  Insect bite  Here today with a mild rash.  Reassured that this is likely due to an insect bite or perhaps plant contact derm.  In either case should resolve without trouble.  Triamcinolone to use if needed See patient instructions for more details.     Signed Lamar Blinks, MD

## 2015-10-25 ENCOUNTER — Ambulatory Visit (INDEPENDENT_AMBULATORY_CARE_PROVIDER_SITE_OTHER): Payer: BLUE CROSS/BLUE SHIELD | Admitting: Family Medicine

## 2015-10-25 ENCOUNTER — Encounter: Payer: Self-pay | Admitting: Family Medicine

## 2015-10-25 VITALS — BP 129/85 | HR 95 | Temp 98.1°F | Wt 157.0 lb

## 2015-10-25 DIAGNOSIS — H538 Other visual disturbances: Secondary | ICD-10-CM | POA: Diagnosis not present

## 2015-10-25 DIAGNOSIS — R002 Palpitations: Secondary | ICD-10-CM

## 2015-10-25 LAB — COMPREHENSIVE METABOLIC PANEL
ALK PHOS: 50 U/L (ref 39–117)
ALT: 10 U/L (ref 0–35)
AST: 13 U/L (ref 0–37)
Albumin: 4.2 g/dL (ref 3.5–5.2)
BILIRUBIN TOTAL: 0.3 mg/dL (ref 0.2–1.2)
BUN: 11 mg/dL (ref 6–23)
CALCIUM: 9.1 mg/dL (ref 8.4–10.5)
CO2: 30 mEq/L (ref 19–32)
CREATININE: 0.8 mg/dL (ref 0.40–1.20)
Chloride: 106 mEq/L (ref 96–112)
GFR: 84.5 mL/min (ref >60.00)
GLUCOSE: 119 mg/dL — AB (ref 70–99)
Potassium: 4.1 mEq/L (ref 3.5–5.1)
Sodium: 138 mEq/L (ref 135–145)
TOTAL PROTEIN: 7.6 g/dL (ref 6.0–8.3)

## 2015-10-25 LAB — CBC WITH DIFFERENTIAL/PLATELET
BASOS ABS: 0 10*3/uL (ref 0.0–0.1)
Basophils Relative: 0.5 % (ref 0.0–3.0)
Eosinophils Absolute: 0.2 10*3/uL (ref 0.0–0.7)
Eosinophils Relative: 2.9 % (ref 0.0–5.0)
HCT: 36.4 % (ref 36.0–46.0)
Hemoglobin: 12.3 g/dL (ref 12.0–15.0)
LYMPHS ABS: 2.7 10*3/uL (ref 0.7–4.0)
Lymphocytes Relative: 32.1 % (ref 12.0–46.0)
MCHC: 33.7 g/dL (ref 30.0–36.0)
MCV: 78.1 fl (ref 78.0–100.0)
MONO ABS: 0.6 10*3/uL (ref 0.1–1.0)
Monocytes Relative: 7.1 % (ref 3.0–12.0)
NEUTROS PCT: 57.4 % (ref 43.0–77.0)
Neutro Abs: 4.8 10*3/uL (ref 1.4–7.7)
PLATELETS: 204 10*3/uL (ref 150.0–400.0)
RBC: 4.66 Mil/uL (ref 3.87–5.11)
RDW: 17.6 % — ABNORMAL HIGH (ref 11.5–15.5)
WBC: 8.4 10*3/uL (ref 4.0–10.5)

## 2015-10-25 LAB — LIPID PANEL
Cholesterol: 165 mg/dL (ref 0–200)
HDL: 36.8 mg/dL — AB (ref >39.00)
LDL Cholesterol: 105 mg/dL — ABNORMAL HIGH (ref 0–99)
NONHDL: 128.05
TRIGLYCERIDES: 113 mg/dL (ref 0.0–149.0)
Total CHOL/HDL Ratio: 4
VLDL: 22.6 mg/dL (ref 0.0–40.0)

## 2015-10-25 LAB — TSH: TSH: 0.65 u[IU]/mL (ref 0.35–4.50)

## 2015-10-25 NOTE — Progress Notes (Signed)
Pre visit review using our clinic review tool, if applicable. No additional management support is needed unless otherwise documented below in the visit note. 

## 2015-10-25 NOTE — Patient Instructions (Addendum)
If you have another episode of blurry vision like you did -- please go to the ER\ Please bring your bp cuff to the next office visit

## 2015-10-25 NOTE — Progress Notes (Signed)
Patient ID: Rachel Wiley, female    DOB: 12-18-1975  Age: 40 y.o. MRN: [id number]    Subjective:  Subjective  HPI Rachel Wiley presents for fluctuating bp and pulse.   Pt saw Dr Johnsie Cancel for this and she was told to only take the BB when pulse is consistently over 100.   No cp, no sob, no palpitations.   She had one episode of blurry vision at night about 1 month ago while sitting on the couch with her family--- it lasted 30 min and went away.  No episodes since.     Review of Systems  Constitutional: Negative for appetite change, diaphoresis, fatigue and unexpected weight change.  Eyes: Positive for visual disturbance. Negative for pain and redness.  Respiratory: Negative for cough, chest tightness, shortness of breath and wheezing.   Cardiovascular: Negative for chest pain, palpitations and leg swelling.  Endocrine: Negative for cold intolerance, heat intolerance, polydipsia, polyphagia and polyuria.  Genitourinary: Negative for difficulty urinating, dysuria and frequency.  Neurological: Negative for dizziness, weakness, light-headedness, numbness and headaches.    History Past Medical History:  Diagnosis Date  . Anxiety   . Headache(784.0)   . Lipoma of unspecified site   . Other acute reactions to stress   . Palpitations   . SVD (spontaneous vaginal delivery) 06/18/2011  . Tachycardia     She has a past surgical history that includes No past surgeries.   Her family history includes Hypertension in her mother.She reports that she has never smoked. She does not have any smokeless tobacco history on file. She reports that she does not drink alcohol or use drugs.  Current Outpatient Prescriptions on File Prior to Visit  Medication Sig Dispense Refill  . propranolol (INDERAL) 20 MG tablet Take 1 tablet (20 mg total) by mouth daily as needed (anxiety per pt). 30 tablet 6  . ranitidine (ZANTAC) 150 MG capsule Take 150 mg by mouth daily as needed for heartburn.     No current  facility-administered medications on file prior to visit.      Objective:  Objective  Physical Exam  Constitutional: She is oriented to person, place, and time. She appears well-developed and well-nourished.  HENT:  Head: Normocephalic and atraumatic.  Eyes: Conjunctivae and EOM are normal.  Neck: Normal range of motion. Neck supple. No JVD present. Carotid bruit is not present. No thyromegaly present.  Cardiovascular: Normal rate, regular rhythm and normal heart sounds.   No murmur heard. Pulmonary/Chest: Effort normal and breath sounds normal. No respiratory distress. She has no wheezes. She has no rales. She exhibits no tenderness.  Musculoskeletal: She exhibits no edema.  Neurological: She is alert and oriented to person, place, and time. She has normal reflexes. She displays normal reflexes. No cranial nerve deficit. She exhibits normal muscle tone. Coordination normal.  Psychiatric: She has a normal mood and affect. Her behavior is normal. Judgment and thought content normal.  Nursing note and vitals reviewed.  BP 129/85 (BP Location: Left Arm, Patient Position: Sitting, Cuff Size: Normal)   Pulse 95   Temp 98.1 F (36.7 C) (Oral)   Wt 157 lb (71.2 kg)   LMP 10/13/2015   SpO2 100%   BMI 26.95 kg/m  Wt Readings from Last 3 Encounters:  10/25/15 157 lb (71.2 kg)  09/07/15 157 lb 6.4 oz (71.4 kg)  07/05/15 153 lb 1.9 oz (69.5 kg)     Lab Results  Component Value Date   WBC 8.4 10/25/2015   HGB 12.3 10/25/2015  HCT 36.4 10/25/2015   PLT 204.0 10/25/2015   GLUCOSE 119 (H) 10/25/2015   CHOL 165 10/25/2015   TRIG 113.0 10/25/2015   HDL 36.80 (L) 10/25/2015   LDLCALC 105 (H) 10/25/2015   ALT 10 10/25/2015   AST 13 10/25/2015   NA 138 10/25/2015   K 4.1 10/25/2015   CL 106 10/25/2015   CREATININE 0.80 10/25/2015   BUN 11 10/25/2015   CO2 30 10/25/2015   TSH 0.65 10/25/2015   HGBA1C 5.3 03/03/2015    US Breast Ltd Uni Left Inc Axilla  Result Date:  03/14/2015 CLINICAL DATA:  Pain within the lateral left breast. Family history of breast cancer in her mother at age 43. EXAM: DIGITAL DIAGNOSTIC BILATERAL MAMMOGRAM WITH 3D TOMOSYNTHESIS WITH CAD ULTRASOUND LEFT BREAST COMPARISON:  None ACR Breast Density Category c: The breast tissue is heterogeneously dense, which may obscure small masses. FINDINGS: There is no mass, distortion, or worrisome calcification within either breast. Mammographic images were processed with CAD. On physical exam, there is no discrete palpable abnormality within the lateral left breast. Targeted ultrasound is performed, showing normal appearing fibroglandular tissue within the lateral left breast. There is no mass, cyst, distortion, or worrisome shadowing. IMPRESSION: No findings worrisome for malignancy. RECOMMENDATION: Bilateral screening mammography in 1 year. I have discussed the findings and recommendations with the patient. Results were also provided in writing at the conclusion of the visit. If applicable, a reminder letter will be sent to the patient regarding the next appointment. BI-RADS CATEGORY  1: Negative. Electronically Signed   By: Altamese Cabal M.D.   On: 03/14/2015 15:20   Mm Diag Breast Tomo Bilateral  Result Date: 03/14/2015 CLINICAL DATA:  Pain within the lateral left breast. Family history of breast cancer in her mother at age 21. EXAM: DIGITAL DIAGNOSTIC BILATERAL MAMMOGRAM WITH 3D TOMOSYNTHESIS WITH CAD ULTRASOUND LEFT BREAST COMPARISON:  None ACR Breast Density Category c: The breast tissue is heterogeneously dense, which may obscure small masses. FINDINGS: There is no mass, distortion, or worrisome calcification within either breast. Mammographic images were processed with CAD. On physical exam, there is no discrete palpable abnormality within the lateral left breast. Targeted ultrasound is performed, showing normal appearing fibroglandular tissue within the lateral left breast. There is no mass, cyst,  distortion, or worrisome shadowing. IMPRESSION: No findings worrisome for malignancy. RECOMMENDATION: Bilateral screening mammography in 1 year. I have discussed the findings and recommendations with the patient. Results were also provided in writing at the conclusion of the visit. If applicable, a reminder letter will be sent to the patient regarding the next appointment. BI-RADS CATEGORY  1: Negative. Electronically Signed   By: Altamese Cabal M.D.   On: 03/14/2015 15:20     Assessment & Plan:  Plan  I have discontinued Ms. Edmondson's triamcinolone cream. I am also having her maintain her propranolol and ranitidine.  No orders of the defined types were placed in this encounter.   Problem List Items Addressed This Visit      Unprioritized   PALPITATIONS, RECURRENT   Relevant Orders   CBC with Differential/Platelet (Completed)   TSH (Completed)   Lipid panel (Completed)   Comprehensive metabolic panel (Completed)    Other Visit Diagnoses    Blurry vision, right eye    -  Primary   Relevant Orders   Ambulatory referral to Ophthalmology   MR Brain Wo Contrast   CBC with Differential/Platelet (Completed)   TSH (Completed)   Lipid panel (Completed)   Comprehensive  metabolic panel (Completed)      Follow-up: Return in about 3 weeks (around 11/15/2015) for hypertension.  [name], DO

## 2015-10-26 ENCOUNTER — Encounter: Payer: Self-pay | Admitting: Family Medicine

## 2015-10-29 ENCOUNTER — Ambulatory Visit (HOSPITAL_BASED_OUTPATIENT_CLINIC_OR_DEPARTMENT_OTHER): Payer: BLUE CROSS/BLUE SHIELD

## 2015-10-29 ENCOUNTER — Ambulatory Visit (HOSPITAL_BASED_OUTPATIENT_CLINIC_OR_DEPARTMENT_OTHER)
Admission: RE | Admit: 2015-10-29 | Discharge: 2015-10-29 | Disposition: A | Discharge status: Home / Self Care | Payer: BLUE CROSS/BLUE SHIELD | Source: Ambulatory Visit | Attending: Family Medicine | Admitting: Family Medicine

## 2015-10-29 DIAGNOSIS — H538 Other visual disturbances: Secondary | ICD-10-CM | POA: Diagnosis not present

## 2015-11-24 ENCOUNTER — Encounter: Payer: BLUE CROSS/BLUE SHIELD | Admitting: Family Medicine

## 2015-11-28 ENCOUNTER — Ambulatory Visit (INDEPENDENT_AMBULATORY_CARE_PROVIDER_SITE_OTHER): Payer: BLUE CROSS/BLUE SHIELD | Admitting: Family

## 2015-11-28 ENCOUNTER — Encounter: Payer: Self-pay | Admitting: Family

## 2015-11-28 VITALS — BP 131/88 | HR 119 | Temp 98.6°F | Resp 20 | Ht 64.0 in | Wt 154.6 lb

## 2015-11-28 DIAGNOSIS — B9789 Other viral agents as the cause of diseases classified elsewhere: Secondary | ICD-10-CM | POA: Diagnosis not present

## 2015-11-28 DIAGNOSIS — N6320 Unspecified lump in the left breast, unspecified quadrant: Secondary | ICD-10-CM

## 2015-11-28 DIAGNOSIS — J020 Streptococcal pharyngitis: Secondary | ICD-10-CM

## 2015-11-28 DIAGNOSIS — J069 Acute upper respiratory infection, unspecified: Secondary | ICD-10-CM

## 2015-11-28 LAB — POCT RAPID STREP A (OFFICE): Rapid Strep A Screen: POSITIVE — AB

## 2015-11-28 MED ORDER — AMOXICILLIN 250 MG/5ML PO SUSR: 500.0000 mg | Freq: Three times a day (TID) | ORAL | 0 refills | Status: DC

## 2015-11-28 MED ORDER — FLUTICASONE PROPIONATE 50 MCG/ACT NA SUSP: 2.0000 | Freq: Every day | NASAL | 0 refills | Status: DC

## 2015-11-28 NOTE — Progress Notes (Signed)
Pre visit review using our clinic review tool, if applicable. No additional management support is needed unless otherwise documented below in the visit note. 

## 2015-11-28 NOTE — Patient Instructions (Signed)
For nasal congestion you may use nasal saline spray, mucinex 600 mg twice daily and flonase. Please call if you develop new/worsening symptoms or fever >101.  You will be contacted about your referral to the breast center for your left breast imagine.

## 2015-11-28 NOTE — Progress Notes (Signed)
Subjective:    Patient ID: Rachel Wiley, female    DOB: [birth year], 40 y.o.   MRN: [id number]  HPI  Rachel Wiley is a 40 yr old female who presents today with chief complaint of "knot" in her left breast x 2 weeks.  Left breast, mildly tender.    Also reports nasal congestion/thick sinus drainage x 2 days. Had sore throat x 2 days last week. Denies fever.  Reports sore throat was Tuesday and Wednesday of last week. Then sore throat returned on Saturday.  Tylenol helps with sore throat.    Review of Systems    see  HPI  Past Medical History:  Diagnosis Date  . Anxiety   . Headache(784.0)   . Lipoma of unspecified site   . Other acute reactions to stress   . Palpitations   . SVD (spontaneous vaginal delivery) 06/18/2011  . Tachycardia      Social History   Social History  . Marital status: Married    Spouse name: N/A  . Number of children: N/A  . Years of education: N/A   Occupational History  .      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 Smoking status: Never Smoker  . Smokeless tobacco: Not on file  . Alcohol use No  . Drug use: No  . Sexual activity: Yes   Other Topics Concern  . Not on file   Social History Narrative   Never Smoked   Alcohol use-no   Drug use-no   Smoking Status:  never   Drug Use:  No   Exercise--- walking             Past Surgical History:  Procedure Laterality Date  . NO PAST SURGERIES      Family History  Problem Relation Age of Onset  . Hypertension Mother   . Hypertension    . Anesthesia problems Neg Hx   . Hypotension Neg Hx   . Malignant hyperthermia Neg Hx   . Pseudochol deficiency Neg Hx     Allergies  Allergen Reactions  . Cetirizine & Related Palpitations    Per pt    Current Outpatient Prescriptions on File Prior to Visit  Medication Sig Dispense Refill  . propranolol (INDERAL) 20 MG tablet Take 1 tablet (20 mg total) by mouth daily as needed (anxiety per pt). 30 tablet 6  . ranitidine  (ZANTAC) 150 MG capsule Take 150 mg by mouth daily as needed for heartburn.     No current facility-administered medications on file prior to visit.     BP 131/88 (BP Location: Left Arm, Wiley Size: Normal)   Pulse (!) 119   Temp 98.6 F (37 C) (Oral)   Resp 20   Ht 5\' 4"  (1.626 m)   Wt 154 lb 9.6 oz (70.1 kg)   LMP 11/15/2015   SpO2 100% Comment: room air  BMI 26.54 kg/m    Exam   Physical Exam  Constitutional: She is oriented to person, place, and time. She appears well-developed and well-nourished.  HENT:  Right Ear: Tympanic membrane and ear canal normal.  Left Ear: Tympanic membrane and ear canal normal.  Mouth/Throat: No oropharyngeal exudate or posterior oropharyngeal edema.  Mild oropharyngeal erythema  Cardiovascular: Normal rate, regular rhythm and normal heart sounds.   No murmur heard. Pulmonary/Chest: Effort normal and breath sounds normal. No respiratory distress. She has no wheezes.  Lymphadenopathy:    She has no cervical adenopathy.  Neurological:  She is alert and oriented to person, place, and time.  Skin: Skin is warm and dry.  Psychiatric: She has a normal mood and affect. Her behavior is normal. Judgment and thought content normal.  Breast:  Pea sized mass left breast at 11 oclock.  Only palpable when sitting upright.       Assessment & Plan:  Strep pharyngitis- Rapid strep is positive. Will rx with amoxicillin. Pt prefers liquid.  Advised pt as follows:  For nasal congestion you may use nasal saline spray, mucinex 600 mg twice daily and flonase. Please call if you develop new/worsening symptoms or fever >101.   Left breast mass- New. will send for [place] and diagnostic mammogram.

## 2015-12-05 ENCOUNTER — Ambulatory Visit
Admission: RE | Admit: 2015-12-05 | Discharge: 2015-12-05 | Disposition: A | Discharge status: Home / Self Care | Payer: BLUE CROSS/BLUE SHIELD | Source: Ambulatory Visit | Attending: Family | Admitting: Family

## 2015-12-05 DIAGNOSIS — N6320 Unspecified lump in the left breast, unspecified quadrant: Secondary | ICD-10-CM

## 2015-12-22 ENCOUNTER — Ambulatory Visit (INDEPENDENT_AMBULATORY_CARE_PROVIDER_SITE_OTHER): Payer: BLUE CROSS/BLUE SHIELD | Admitting: Family Medicine

## 2015-12-22 ENCOUNTER — Encounter: Payer: Self-pay | Admitting: Family Medicine

## 2015-12-22 VITALS — BP 110/72 | HR 80 | Temp 98.3°F | Resp 16 | Ht 64.0 in | Wt 159.2 lb

## 2015-12-22 DIAGNOSIS — Z0000 Encounter for general adult medical examination without abnormal findings: Secondary | ICD-10-CM | POA: Diagnosis not present

## 2015-12-22 DIAGNOSIS — N63 Unspecified lump in unspecified breast: Secondary | ICD-10-CM | POA: Diagnosis not present

## 2015-12-22 DIAGNOSIS — Z23 Encounter for immunization: Secondary | ICD-10-CM | POA: Diagnosis not present

## 2015-12-22 LAB — CBC WITH DIFFERENTIAL/PLATELET
BASOS PCT: 0.4 % (ref 0.0–3.0)
Basophils Absolute: 0 10*3/uL (ref 0.0–0.1)
EOS ABS: 0.3 10*3/uL (ref 0.0–0.7)
Eosinophils Relative: 4.3 % (ref 0.0–5.0)
HEMATOCRIT: 35.7 % — AB (ref 36.0–46.0)
Hemoglobin: 11.7 g/dL — ABNORMAL LOW (ref 12.0–15.0)
LYMPHS PCT: 35.7 % (ref 12.0–46.0)
Lymphs Abs: 2.8 10*3/uL (ref 0.7–4.0)
MCHC: 32.8 g/dL (ref 30.0–36.0)
MCV: 78.9 fl (ref 78.0–100.0)
Monocytes Absolute: 0.5 10*3/uL (ref 0.1–1.0)
Monocytes Relative: 6.4 % (ref 3.0–12.0)
NEUTROS ABS: 4.1 10*3/uL (ref 1.4–7.7)
Neutrophils Relative %: 53.2 % (ref 43.0–77.0)
PLATELETS: 201 10*3/uL (ref 150.0–400.0)
RBC: 4.53 Mil/uL (ref 3.87–5.11)
RDW: 15.8 % — AB (ref 11.5–15.5)
WBC: 7.8 10*3/uL (ref 4.0–10.5)

## 2015-12-22 LAB — COMPREHENSIVE METABOLIC PANEL
ALT: 13 U/L (ref 0–35)
AST: 15 U/L (ref 0–37)
Albumin: 4.1 g/dL (ref 3.5–5.2)
Alkaline Phosphatase: 53 U/L (ref 39–117)
BUN: 15 mg/dL (ref 6–23)
CALCIUM: 9.3 mg/dL (ref 8.4–10.5)
CHLORIDE: 104 meq/L (ref 96–112)
CO2: 27 meq/L (ref 19–32)
CREATININE: 0.84 mg/dL (ref 0.40–1.20)
GFR: 79.81 mL/min (ref >60.00)
Glucose, Bld: 88 mg/dL (ref 70–99)
Potassium: 4 mEq/L (ref 3.5–5.1)
SODIUM: 138 meq/L (ref 135–145)
Total Bilirubin: 0.4 mg/dL (ref 0.2–1.2)
Total Protein: 7.5 g/dL (ref 6.0–8.3)

## 2015-12-22 LAB — LIPID PANEL
CHOL/HDL RATIO: 4
Cholesterol: 162 mg/dL (ref 0–200)
HDL: 40.7 mg/dL (ref >39.00)
LDL Cholesterol: 100 mg/dL — ABNORMAL HIGH (ref 0–99)
NONHDL: 121.76
TRIGLYCERIDES: 108 mg/dL (ref 0.0–149.0)
VLDL: 21.6 mg/dL (ref 0.0–40.0)

## 2015-12-22 LAB — TSH: TSH: 1.33 u[IU]/mL (ref 0.35–4.50)

## 2015-12-22 NOTE — Patient Instructions (Signed)
Preventive Care for Adults, Female A healthy lifestyle and preventive care can promote health and wellness. Preventive health guidelines for women include the following key practices.  A routine yearly physical is a good way to check with your health care provider about your health and preventive screening. It is a chance to share any concerns and updates on your health and to receive a thorough exam.  Visit your dentist for a routine exam and preventive care every 6 months. Brush your teeth twice a day and floss once a day. Good oral hygiene prevents tooth decay and gum disease.  The frequency of eye exams is based on your age, health, family medical history, use of contact lenses, and other factors. Follow your health care provider's recommendations for frequency of eye exams.  Eat a healthy diet. Foods like vegetables, fruits, whole grains, low-fat dairy products, and lean protein foods contain the nutrients you need without too many calories. Decrease your intake of foods high in solid fats, added sugars, and salt. Eat the right amount of calories for you.Get information about a proper diet from your health care provider, if necessary.  Regular physical exercise is one of the most important things you can do for your health. Most adults should get at least 150 minutes of moderate-intensity exercise (any activity that increases your heart rate and causes you to sweat) each week. In addition, most adults need muscle-strengthening exercises on 2 or more days a week.  Maintain a healthy weight. The body mass index (BMI) is a screening tool to identify possible weight problems. It provides an estimate of body fat based on height and weight. Your health care provider can find your BMI and can help you achieve or maintain a healthy weight.For adults 20 years and older:  A BMI below 18.5 is considered underweight.  A BMI of 18.5 to 24.9 is normal.  A BMI of 25 to 29.9 is considered overweight.  A  BMI of 30 and above is considered obese.  Maintain normal blood lipids and cholesterol levels by exercising and minimizing your intake of saturated fat. Eat a balanced diet with plenty of fruit and vegetables. Blood tests for lipids and cholesterol should begin at age 45 and be repeated every 5 years. If your lipid or cholesterol levels are high, you are over 50, or you are at high risk for heart disease, you may need your cholesterol levels checked more frequently.Ongoing high lipid and cholesterol levels should be treated with medicines if diet and exercise are not working.  If you smoke, find out from your health care provider how to quit. If you do not use tobacco, do not start.  Lung cancer screening is recommended for adults aged 45-80 years who are at high risk for developing lung cancer because of a history of smoking. A yearly low-dose CT scan of the lungs is recommended for people who have at least a 30-pack-year history of smoking and are a current smoker or have quit within the past 15 years. A pack year of smoking is smoking an average of 1 pack of cigarettes a day for 1 year (for example: 1 pack a day for 30 years or 2 packs a day for 15 years). Yearly screening should continue until the smoker has stopped smoking for at least 15 years. Yearly screening should be stopped for people who develop a health problem that would prevent them from having lung cancer treatment.  If you are pregnant, do not drink alcohol. If you are  breastfeeding, be very cautious about drinking alcohol. If you are not pregnant and choose to drink alcohol, do not have more than 1 drink per day. One drink is considered to be 12 ounces (355 mL) of beer, 5 ounces (148 mL) of wine, or 1.5 ounces (44 mL) of liquor.  Avoid use of street drugs. Do not share needles with anyone. Ask for help if you need support or instructions about stopping the use of drugs.  High blood pressure causes heart disease and increases the risk  of stroke. Your blood pressure should be checked at least every 1 to 2 years. Ongoing high blood pressure should be treated with medicines if weight loss and exercise do not work.  If you are 55-79 years old, ask your health care provider if you should take aspirin to prevent strokes.  Diabetes screening is done by taking a blood sample to check your blood glucose level after you have not eaten for a certain period of time (fasting). If you are not overweight and you do not have risk factors for diabetes, you should be screened once every 3 years starting at age 45. If you are overweight or obese and you are 40-70 years of age, you should be screened for diabetes every year as part of your cardiovascular risk assessment.  Breast cancer screening is essential preventive care for women. You should practice "breast self-awareness." This means understanding the normal appearance and feel of your breasts and may include breast self-examination. Any changes detected, no matter how small, should be reported to a health care provider. Women in their 20s and 30s should have a clinical breast exam (CBE) by a health care provider as part of a regular health exam every 1 to 3 years. After age 40, women should have a CBE every year. Starting at age 40, women should consider having a mammogram (breast X-ray test) every year. Women who have a family history of breast cancer should talk to their health care provider about genetic screening. Women at a high risk of breast cancer should talk to their health care providers about having an MRI and a mammogram every year.  Breast cancer gene (BRCA)-related cancer risk assessment is recommended for women who have family members with BRCA-related cancers. BRCA-related cancers include breast, ovarian, tubal, and peritoneal cancers. Having family members with these cancers may be associated with an increased risk for harmful changes (mutations) in the breast cancer genes BRCA1 and  BRCA2. Results of the assessment will determine the need for genetic counseling and BRCA1 and BRCA2 testing.  Your health care provider may recommend that you be screened regularly for cancer of the pelvic organs (ovaries, uterus, and vagina). This screening involves a pelvic examination, including checking for microscopic changes to the surface of your cervix (Pap test). You may be encouraged to have this screening done every 3 years, beginning at age 21.  For women ages 30-65, health care providers may recommend pelvic exams and Pap testing every 3 years, or they may recommend the Pap and pelvic exam, combined with testing for human papilloma virus (HPV), every 5 years. Some types of HPV increase your risk of cervical cancer. Testing for HPV may also be done on women of any age with unclear Pap test results.  Other health care providers may not recommend any screening for nonpregnant women who are considered low risk for pelvic cancer and who do not have symptoms. Ask your health care provider if a screening pelvic exam is right for   you.  If you have had past treatment for cervical cancer or a condition that could lead to cancer, you need Pap tests and screening for cancer for at least 20 years after your treatment. If Pap tests have been discontinued, your risk factors (such as having a new sexual partner) need to be reassessed to determine if screening should resume. Some women have medical problems that increase the chance of getting cervical cancer. In these cases, your health care provider may recommend more frequent screening and Pap tests.  Colorectal cancer can be detected and often prevented. Most routine colorectal cancer screening begins at the age of 50 years and continues through age 75 years. However, your health care provider may recommend screening at an earlier age if you have risk factors for colon cancer. On a yearly basis, your health care provider may provide home test kits to check  for hidden blood in the stool. Use of a small camera at the end of a tube, to directly examine the colon (sigmoidoscopy or colonoscopy), can detect the earliest forms of colorectal cancer. Talk to your health care provider about this at age 50, when routine screening begins. Direct exam of the colon should be repeated every 5-10 years through age 75 years, unless early forms of precancerous polyps or small growths are found.  People who are at an increased risk for hepatitis B should be screened for this virus. You are considered at high risk for hepatitis B if:  You were born in a country where hepatitis B occurs often. Talk with your health care provider about which countries are considered high risk.  Your parents were born in a high-risk country and you have not received a shot to protect against hepatitis B (hepatitis B vaccine).  You have HIV or AIDS.  You use needles to inject street drugs.  You live with, or have sex with, someone who has hepatitis B.  You get hemodialysis treatment.  You take certain medicines for conditions like cancer, organ transplantation, and autoimmune conditions.  Hepatitis C blood testing is recommended for all people born from 1945 through 1965 and any individual with known risks for hepatitis C.  Practice safe sex. Use condoms and avoid high-risk sexual practices to reduce the spread of sexually transmitted infections (STIs). STIs include gonorrhea, chlamydia, syphilis, trichomonas, herpes, HPV, and human immunodeficiency virus (HIV). Herpes, HIV, and HPV are viral illnesses that have no cure. They can result in disability, cancer, and death.  You should be screened for sexually transmitted illnesses (STIs) including gonorrhea and chlamydia if:  You are sexually active and are younger than 24 years.  You are older than 24 years and your health care provider tells you that you are at risk for this type of infection.  Your sexual activity has changed  since you were last screened and you are at an increased risk for chlamydia or gonorrhea. Ask your health care provider if you are at risk.  If you are at risk of being infected with HIV, it is recommended that you take a prescription medicine daily to prevent HIV infection. This is called preexposure prophylaxis (PrEP). You are considered at risk if:  You are sexually active and do not regularly use condoms or know the HIV status of your partner(s).  You take drugs by injection.  You are sexually active with a partner who has HIV.  Talk with your health care provider about whether you are at high risk of being infected with HIV. If   you choose to begin PrEP, you should first be tested for HIV. You should then be tested every 3 months for as long as you are taking PrEP.  Osteoporosis is a disease in which the bones lose minerals and strength with aging. This can result in serious bone fractures or breaks. The risk of osteoporosis can be identified using a bone density scan. Women ages 67 years and over and women at risk for fractures or osteoporosis should discuss screening with their health care providers. Ask your health care provider whether you should take a calcium supplement or vitamin D to reduce the rate of osteoporosis.  Menopause can be associated with physical symptoms and risks. Hormone replacement therapy is available to decrease symptoms and risks. You should talk to your health care provider about whether hormone replacement therapy is right for you.  Use sunscreen. Apply sunscreen liberally and repeatedly throughout the day. You should seek shade when your shadow is shorter than you. Protect yourself by wearing long sleeves, pants, a wide-brimmed hat, and sunglasses year round, whenever you are outdoors.  Once a month, do a whole body skin exam, using a mirror to look at the skin on your back. Tell your health care provider of new moles, moles that have irregular borders, moles that  are larger than a pencil eraser, or moles that have changed in shape or color.  Stay current with required vaccines (immunizations).  Influenza vaccine. All adults should be immunized every year.  Tetanus, diphtheria, and acellular pertussis (Td, Tdap) vaccine. Pregnant women should receive 1 dose of Tdap vaccine during each pregnancy. The dose should be obtained regardless of the length of time since the last dose. Immunization is preferred during the 27th-36th week of gestation. An adult who has not previously received Tdap or who does not know her vaccine status should receive 1 dose of Tdap. This initial dose should be followed by tetanus and diphtheria toxoids (Td) booster doses every 10 years. Adults with an unknown or incomplete history of completing a 3-dose immunization series with Td-containing vaccines should begin or complete a primary immunization series including a Tdap dose. Adults should receive a Td booster every 10 years.  Varicella vaccine. An adult without evidence of immunity to varicella should receive 2 doses or a second dose if she has previously received 1 dose. Pregnant females who do not have evidence of immunity should receive the first dose after pregnancy. This first dose should be obtained before leaving the health care facility. The second dose should be obtained 4-8 weeks after the first dose.  Human papillomavirus (HPV) vaccine. Females aged 13-26 years who have not received the vaccine previously should obtain the 3-dose series. The vaccine is not recommended for use in pregnant females. However, pregnancy testing is not needed before receiving a dose. If a female is found to be pregnant after receiving a dose, no treatment is needed. In that case, the remaining doses should be delayed until after the pregnancy. Immunization is recommended for any person with an immunocompromised condition through the age of 61 years if she did not get any or all doses earlier. During the  3-dose series, the second dose should be obtained 4-8 weeks after the first dose. The third dose should be obtained 24 weeks after the first dose and 16 weeks after the second dose.  Zoster vaccine. One dose is recommended for adults aged 30 years or older unless certain conditions are present.  Measles, mumps, and rubella (MMR) vaccine. Adults born  before 1957 generally are considered immune to measles and mumps. Adults born in 1957 or later should have 1 or more doses of MMR vaccine unless there is a contraindication to the vaccine or there is laboratory evidence of immunity to each of the three diseases. A routine second dose of MMR vaccine should be obtained at least 28 days after the first dose for students attending postsecondary schools, health care workers, or international travelers. People who received inactivated measles vaccine or an unknown type of measles vaccine during 1963-1967 should receive 2 doses of MMR vaccine. People who received inactivated mumps vaccine or an unknown type of mumps vaccine before 1979 and are at high risk for mumps infection should consider immunization with 2 doses of MMR vaccine. For females of childbearing age, rubella immunity should be determined. If there is no evidence of immunity, females who are not pregnant should be vaccinated. If there is no evidence of immunity, females who are pregnant should delay immunization until after pregnancy. Unvaccinated health care workers born before 1957 who lack laboratory evidence of measles, mumps, or rubella immunity or laboratory confirmation of disease should consider measles and mumps immunization with 2 doses of MMR vaccine or rubella immunization with 1 dose of MMR vaccine.  Pneumococcal 13-valent conjugate (PCV13) vaccine. When indicated, a person who is uncertain of his immunization history and has no record of immunization should receive the PCV13 vaccine. All adults 65 years of age and older should receive this  vaccine. An adult aged 19 years or older who has certain medical conditions and has not been previously immunized should receive 1 dose of PCV13 vaccine. This PCV13 should be followed with a dose of pneumococcal polysaccharide (PPSV23) vaccine. Adults who are at high risk for pneumococcal disease should obtain the PPSV23 vaccine at least 8 weeks after the dose of PCV13 vaccine. Adults older than 40 years of age who have normal immune system function should obtain the PPSV23 vaccine dose at least 1 year after the dose of PCV13 vaccine.  Pneumococcal polysaccharide (PPSV23) vaccine. When PCV13 is also indicated, PCV13 should be obtained first. All adults aged 65 years and older should be immunized. An adult younger than age 65 years who has certain medical conditions should be immunized. Any person who resides in a nursing home or long-term care facility should be immunized. An adult smoker should be immunized. People with an immunocompromised condition and certain other conditions should receive both PCV13 and PPSV23 vaccines. People with human immunodeficiency virus (HIV) infection should be immunized as soon as possible after diagnosis. Immunization during chemotherapy or radiation therapy should be avoided. Routine use of PPSV23 vaccine is not recommended for American Indians, [place] Natives, or people younger than 65 years unless there are medical conditions that require PPSV23 vaccine. When indicated, people who have unknown immunization and have no record of immunization should receive PPSV23 vaccine. One-time revaccination 5 years after the first dose of PPSV23 is recommended for people aged 19-64 years who have chronic kidney failure, nephrotic syndrome, asplenia, or immunocompromised conditions. People who received 1-2 doses of PPSV23 before age 65 years should receive another dose of PPSV23 vaccine at age 65 years or later if at least 5 years have passed since the previous dose. Doses of PPSV23 are not  needed for people immunized with PPSV23 at or after age 65 years.  Meningococcal vaccine. Adults with asplenia or persistent complement component deficiencies should receive 2 doses of quadrivalent meningococcal conjugate (MenACWY-D) vaccine. The doses should be obtained   at least 2 months apart. Microbiologists working with certain meningococcal bacteria, [place] recruits, people at risk during an outbreak, and people who travel to or live in countries with a high rate of meningitis should be immunized. A first-year college student up through age 34 years who is living in a residence hall should receive a dose if she did not receive a dose on or after her 16th birthday. Adults who have certain high-risk conditions should receive one or more doses of vaccine.  Hepatitis A vaccine. Adults who wish to be protected from this disease, have certain high-risk conditions, work with hepatitis A-infected animals, work in hepatitis A research labs, or travel to or work in countries with a high rate of hepatitis A should be immunized. Adults who were previously unvaccinated and who anticipate close contact with an international adoptee during the first 60 days after arrival in the [place] from a country with a high rate of hepatitis A should be immunized.  Hepatitis B vaccine. Adults who wish to be protected from this disease, have certain high-risk conditions, may be exposed to blood or other infectious body fluids, are household contacts or sex partners of hepatitis B positive people, are clients or workers in certain care facilities, or travel to or work in countries with a high rate of hepatitis B should be immunized.  Haemophilus influenzae type b (Hib) vaccine. A previously unvaccinated person with asplenia or sickle cell disease or having a scheduled splenectomy should receive 1 dose of Hib vaccine. Regardless of previous immunization, a recipient of a hematopoietic stem cell transplant should receive a  3-dose series 6-12 months after her successful transplant. Hib vaccine is not recommended for adults with HIV infection. Preventive Services / Frequency Ages 35 to 4 years  Blood pressure check.** / Every 3-5 years.  Lipid and cholesterol check.** / Every 5 years beginning at age 60.  Clinical breast exam.** / Every 3 years for women in their 71s and 10s.  BRCA-related cancer risk assessment.** / For women who have family members with a BRCA-related cancer (breast, ovarian, tubal, or peritoneal cancers).  Pap test.** / Every 2 years from ages 76 through 26. Every 3 years starting at age 61 through age 76 or 93 with a history of 3 consecutive normal Pap tests.  HPV screening.** / Every 3 years from ages 37 through ages 60 to 51 with a history of 3 consecutive normal Pap tests.  Hepatitis C blood test.** / For any individual with known risks for hepatitis C.  Skin self-exam. / Monthly.  Influenza vaccine. / Every year.  Tetanus, diphtheria, and acellular pertussis (Tdap, Td) vaccine.** / Consult your health care provider. Pregnant women should receive 1 dose of Tdap vaccine during each pregnancy. 1 dose of Td every 10 years.  Varicella vaccine.** / Consult your health care provider. Pregnant females who do not have evidence of immunity should receive the first dose after pregnancy.  HPV vaccine. / 3 doses over 6 months, if 93 and younger. The vaccine is not recommended for use in pregnant females. However, pregnancy testing is not needed before receiving a dose.  Measles, mumps, rubella (MMR) vaccine.** / You need at least 1 dose of MMR if you were born in 1957 or later. You may also need a 2nd dose. For females of childbearing age, rubella immunity should be determined. If there is no evidence of immunity, females who are not pregnant should be vaccinated. If there is no evidence of immunity, females who are  pregnant should delay immunization until after pregnancy.  Pneumococcal  13-valent conjugate (PCV13) vaccine.** / Consult your health care provider.  Pneumococcal polysaccharide (PPSV23) vaccine.** / 1 to 2 doses if you smoke cigarettes or if you have certain conditions.  Meningococcal vaccine.** / 1 dose if you are age 68 to 8 years and a Market researcher living in a residence hall, or have one of several medical conditions, you need to get vaccinated against meningococcal disease. You may also need additional booster doses.  Hepatitis A vaccine.** / Consult your health care provider.  Hepatitis B vaccine.** / Consult your health care provider.  Haemophilus influenzae type b (Hib) vaccine.** / Consult your health care provider. Ages 7 to 53 years  Blood pressure check.** / Every year.  Lipid and cholesterol check.** / Every 5 years beginning at age 25 years.  Lung cancer screening. / Every year if you are aged 11-80 years and have a 30-pack-year history of smoking and currently smoke or have quit within the past 15 years. Yearly screening is stopped once you have quit smoking for at least 15 years or develop a health problem that would prevent you from having lung cancer treatment.  Clinical breast exam.** / Every year after age 48 years.  BRCA-related cancer risk assessment.** / For women who have family members with a BRCA-related cancer (breast, ovarian, tubal, or peritoneal cancers).  Mammogram.** / Every year beginning at age 41 years and continuing for as long as you are in good health. Consult with your health care provider.  Pap test.** / Every 3 years starting at age 65 years through age 37 or 70 years with a history of 3 consecutive normal Pap tests.  HPV screening.** / Every 3 years from ages 72 years through ages 60 to 40 years with a history of 3 consecutive normal Pap tests.  Fecal occult blood test (FOBT) of stool. / Every year beginning at age 21 years and continuing until age 5 years. You may not need to do this test if you get  a colonoscopy every 10 years.  Flexible sigmoidoscopy or colonoscopy.** / Every 5 years for a flexible sigmoidoscopy or every 10 years for a colonoscopy beginning at age 35 years and continuing until age 48 years.  Hepatitis C blood test.** / For all people born from [birth year] through 1965 and any individual with known risks for hepatitis C.  Skin self-exam. / Monthly.  Influenza vaccine. / Every year.  Tetanus, diphtheria, and acellular pertussis (Tdap/Td) vaccine.** / Consult your health care provider. Pregnant women should receive 1 dose of Tdap vaccine during each pregnancy. 1 dose of Td every 10 years.  Varicella vaccine.** / Consult your health care provider. Pregnant females who do not have evidence of immunity should receive the first dose after pregnancy.  Zoster vaccine.** / 1 dose for adults aged 30 years or older.  Measles, mumps, rubella (MMR) vaccine.** / You need at least 1 dose of MMR if you were born in 1957 or later. You may also need a second dose. For females of childbearing age, rubella immunity should be determined. If there is no evidence of immunity, females who are not pregnant should be vaccinated. If there is no evidence of immunity, females who are pregnant should delay immunization until after pregnancy.  Pneumococcal 13-valent conjugate (PCV13) vaccine.** / Consult your health care provider.  Pneumococcal polysaccharide (PPSV23) vaccine.** / 1 to 2 doses if you smoke cigarettes or if you have certain conditions.  Meningococcal vaccine.** /  Consult your health care provider.  Hepatitis A vaccine.** / Consult your health care provider.  Hepatitis B vaccine.** / Consult your health care provider.  Haemophilus influenzae type b (Hib) vaccine.** / Consult your health care provider. Ages 64 years and over  Blood pressure check.** / Every year.  Lipid and cholesterol check.** / Every 5 years beginning at age 23 years.  Lung cancer screening. / Every year if you  are aged 16-80 years and have a 30-pack-year history of smoking and currently smoke or have quit within the past 15 years. Yearly screening is stopped once you have quit smoking for at least 15 years or develop a health problem that would prevent you from having lung cancer treatment.  Clinical breast exam.** / Every year after age 74 years.  BRCA-related cancer risk assessment.** / For women who have family members with a BRCA-related cancer (breast, ovarian, tubal, or peritoneal cancers).  Mammogram.** / Every year beginning at age 44 years and continuing for as long as you are in good health. Consult with your health care provider.  Pap test.** / Every 3 years starting at age 58 years through age 22 or 39 years with 3 consecutive normal Pap tests. Testing can be stopped between 65 and 70 years with 3 consecutive normal Pap tests and no abnormal Pap or HPV tests in the past 10 years.  HPV screening.** / Every 3 years from ages 64 years through ages 70 or 61 years with a history of 3 consecutive normal Pap tests. Testing can be stopped between 65 and 70 years with 3 consecutive normal Pap tests and no abnormal Pap or HPV tests in the past 10 years.  Fecal occult blood test (FOBT) of stool. / Every year beginning at age 40 years and continuing until age 27 years. You may not need to do this test if you get a colonoscopy every 10 years.  Flexible sigmoidoscopy or colonoscopy.** / Every 5 years for a flexible sigmoidoscopy or every 10 years for a colonoscopy beginning at age 7 years and continuing until age 32 years.  Hepatitis C blood test.** / For all people born from [birth year] through 1965 and any individual with known risks for hepatitis C.  Osteoporosis screening.** / A one-time screening for women ages 30 years and over and women at risk for fractures or osteoporosis.  Skin self-exam. / Monthly.  Influenza vaccine. / Every year.  Tetanus, diphtheria, and acellular pertussis (Tdap/Td)  vaccine.** / 1 dose of Td every 10 years.  Varicella vaccine.** / Consult your health care provider.  Zoster vaccine.** / 1 dose for adults aged 35 years or older.  Pneumococcal 13-valent conjugate (PCV13) vaccine.** / Consult your health care provider.  Pneumococcal polysaccharide (PPSV23) vaccine.** / 1 dose for all adults aged 46 years and older.  Meningococcal vaccine.** / Consult your health care provider.  Hepatitis A vaccine.** / Consult your health care provider.  Hepatitis B vaccine.** / Consult your health care provider.  Haemophilus influenzae type b (Hib) vaccine.** / Consult your health care provider. ** Family history and personal history of risk and conditions may change your health care provider's recommendations.   This information is not intended to replace advice given to you by your health care provider. Make sure you discuss any questions you have with your health care provider.   Document Released: 03/27/2001 Document Revised: 02/19/2014 Document Reviewed: 06/26/2010 Elsevier Interactive Patient Education [employer].

## 2015-12-22 NOTE — Progress Notes (Signed)
Subjective:     Rachel Wiley is a 40 y.o. female and is here for a comprehensive physical exam. The patient reports no problems.  Social History   Social History  . Marital status: Married    Spouse name: N/A  . Number of children: N/A  . Years of education: N/A   Occupational History  .      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 Smoking status: Never Smoker  . Smokeless tobacco: Not on file  . Alcohol use No  . Drug use: No  . Sexual activity: Yes   Other Topics Concern  . Not on file   Social History Narrative   Never Smoked   Alcohol use-no   Drug use-no   Smoking Status:  never   Drug Use:  No   Exercise--- walking            Health Maintenance  Topic Date Due  . INFLUENZA VACCINE  12/29/2015 (Originally 09/13/2015)  . PAP SMEAR  11/05/2016  . TETANUS/TDAP  06/18/2021  . HIV Screening  Completed    The following portions of the patient's history were reviewed and updated as appropriate: She  has a past medical history of Anxiety; Headache(784.0); Lipoma of unspecified site; Other acute reactions to stress; Palpitations; SVD (spontaneous vaginal delivery) (06/18/2011); and Tachycardia. She  does not have any pertinent problems on file. She  has a past surgical history that includes No past surgeries. Her family history includes Hypertension in her mother. She  reports that she has never smoked. She does not have any smokeless tobacco history on file. She reports that she does not drink alcohol or use drugs. She has a current medication list which includes the following prescription(s): fluticasone, propranolol, and ranitidine. Current Outpatient Prescriptions on File Prior to Visit  Medication Sig Dispense Refill  . fluticasone (FLONASE) 50 MCG/ACT nasal spray Place 2 sprays into both nostrils daily. 16 g 0  . propranolol (INDERAL) 20 MG tablet Take 1 tablet (20 mg total) by mouth daily as needed (anxiety per pt). 30 tablet 6  . ranitidine  (ZANTAC) 150 MG capsule Take 150 mg by mouth daily as needed for heartburn.     No current facility-administered medications on file prior to visit.    She is allergic to cetirizine & related..  Review of Systems Review of Systems  Constitutional: Negative for activity change, appetite change and fatigue.  HENT: Negative for hearing loss, congestion, tinnitus and ear discharge.  dentist q54m Eyes: Negative for visual disturbance (see optho q1y -- vision corrected to 20/20 with glasses).  Respiratory: Negative for cough, chest tightness and shortness of breath.   Cardiovascular: Negative for chest pain, palpitations and leg swelling.  Gastrointestinal: Negative for abdominal pain, diarrhea, constipation and abdominal distention.  Genitourinary: Negative for urgency, frequency, decreased urine volume and difficulty urinating.  Musculoskeletal: Negative for back pain, arthralgias and gait problem.  Skin: Negative for color change, pallor and rash.  Neurological: Negative for dizziness, light-headedness, numbness and headaches.  Hematological: Negative for adenopathy. Does not bruise/bleed easily.  Psychiatric/Behavioral: Negative for suicidal ideas, confusion, sleep disturbance, self-injury, dysphoric mood, decreased concentration and agitation.       Objective:    BP 110/72 (BP Location: Left Arm, Patient Position: Sitting, Cuff Size: Normal)   Pulse 80   Temp 98.3 F (36.8 C) (Oral)   Resp 16   Ht 5\' 4"  (1.626 m)   Wt 159 lb 3.2 oz (72.2 kg)  LMP 11/17/2015 Comment: patient is currently having a period.  SpO2 99%   BMI 27.33 kg/m  General appearance: alert, cooperative, appears stated age and no distress Head: bec Eyes: conjunctivae/corneas clear. PERRL, EOM's intact. Fundi benign. Ears: normal TM's and external ear canals both ears Nose: Nares normal. Septum midline. Mucosa normal. No drainage or sinus tenderness. Throat: lips, mucosa, and tongue normal; teeth and gums  normal Neck: no adenopathy, no carotid bruit, no JVD, supple, symmetrical, trachea midline and thyroid not enlarged, symmetric, no tenderness/mass/nodules Back: symmetric, no curvature. ROM normal. No CVA tenderness. Lungs: clear to auscultation bilaterally Breasts: normal appearance, no masses or tenderness Heart: regular rate and rhythm, S1, S2 normal, no murmur, click, rub or gallop Abdomen: soft, non-tender; bowel sounds normal; no masses,  no organomegaly Pelvic: deferred Extremities: extremities normal, atraumatic, no cyanosis or edema Pulses: 2+ and symmetric Skin: Skin color, texture, turgor normal. No rashes or lesions Lymph nodes: Cervical, supraclavicular, and axillary nodes normal. Neurologic: Alert and oriented X 3, normal strength and tone. Normal symmetric reflexes. Normal coordination and gait    Assessment:    Healthy female exam.     Plan:   ghm utd Check labs   See After Visit Summary for Counseling Recommendations   F/u for pap  1. Preventative health care See above - Comprehensive metabolic panel - CBC with Differential/Platelet - Lipid panel - POCT urinalysis dipstick - TSH  2. Breast nodule  - US BREAST COMPLETE UNI RIGHT INC AXILLA; Future - MM DIAG BREAST TOMO UNI RIGHT; Future  3. Encounter for immunization   - Flu Vaccine QUAD 36+ mos IM

## 2015-12-22 NOTE — Progress Notes (Signed)
Pre visit review using our clinic review tool, if applicable. No additional management support is needed unless otherwise documented below in the visit note. 

## 2015-12-27 ENCOUNTER — Encounter: Payer: Self-pay | Admitting: Family Medicine

## 2015-12-27 ENCOUNTER — Telehealth: Payer: Self-pay | Admitting: Family Medicine

## 2015-12-27 NOTE — Telephone Encounter (Signed)
Relation to [id number] Call back number:[phone]   Reason for call:  Patient inquiring about lab results

## 2015-12-27 NOTE — Telephone Encounter (Signed)
error:[id number] ° °

## 2015-12-28 NOTE — Telephone Encounter (Signed)
Notified pt and she states she has no questions at this time.

## 2015-12-28 NOTE — Telephone Encounter (Signed)
Released in mychart

## 2015-12-29 ENCOUNTER — Encounter: Payer: Self-pay | Admitting: Family Medicine

## 2015-12-29 ENCOUNTER — Ambulatory Visit (INDEPENDENT_AMBULATORY_CARE_PROVIDER_SITE_OTHER): Payer: BLUE CROSS/BLUE SHIELD | Admitting: Family Medicine

## 2015-12-29 ENCOUNTER — Other Ambulatory Visit (HOSPITAL_COMMUNITY)
Admission: RE | Admit: 2015-12-29 | Discharge: 2015-12-29 | Disposition: A | Discharge status: Home / Self Care | Payer: BLUE CROSS/BLUE SHIELD | Source: Ambulatory Visit | Attending: Family Medicine | Admitting: Family Medicine

## 2015-12-29 VITALS — BP 118/83 | HR 71 | Temp 98.1°F | Ht 64.0 in | Wt 161.4 lb

## 2015-12-29 DIAGNOSIS — K644 Residual hemorrhoidal skin tags: Secondary | ICD-10-CM

## 2015-12-29 DIAGNOSIS — Z01419 Encounter for gynecological examination (general) (routine) without abnormal findings: Secondary | ICD-10-CM | POA: Insufficient documentation

## 2015-12-29 DIAGNOSIS — Z0000 Encounter for general adult medical examination without abnormal findings: Secondary | ICD-10-CM

## 2015-12-29 DIAGNOSIS — Z1151 Encounter for screening for human papillomavirus (HPV): Secondary | ICD-10-CM | POA: Insufficient documentation

## 2015-12-29 DIAGNOSIS — Z124 Encounter for screening for malignant neoplasm of cervix: Secondary | ICD-10-CM

## 2015-12-29 NOTE — Patient Instructions (Signed)

## 2015-12-29 NOTE — Progress Notes (Signed)
Pre visit review using our clinic tool,if applicable. No additional management support is needed unless otherwise documented below in the visit note.  

## 2015-12-29 NOTE — Assessment & Plan Note (Signed)
Pt states prep H works well.  She just forgot to use it last few days Can refer to GI if it does not improve

## 2015-12-29 NOTE — Progress Notes (Signed)
Patient ID: Rachel Wiley, female    DOB: 10-24-1975  Age: 40 y.o. MRN: [id number]    Subjective:  Subjective  HPI Rachel Wiley presents for pap only..  The rest of cpe was done last week.    Review of Systems  Constitutional: Negative for appetite change, diaphoresis, fatigue and unexpected weight change.  Eyes: Negative for pain, redness and visual disturbance.  Respiratory: Negative for cough, chest tightness, shortness of breath and wheezing.   Cardiovascular: Negative for chest pain, palpitations and leg swelling.  Endocrine: Negative for cold intolerance, heat intolerance, polydipsia, polyphagia and polyuria.  Genitourinary: Negative for difficulty urinating, dysuria and frequency.  Neurological: Negative for dizziness, light-headedness, numbness and headaches.    History Past Medical History:  Diagnosis Date  . Anxiety   . Headache(784.0)   . Lipoma of unspecified site   . Other acute reactions to stress   . Palpitations   . SVD (spontaneous vaginal delivery) 06/18/2011  . Tachycardia     She has a past surgical history that includes No past surgeries.   Her family history includes Hypertension in her mother.She reports that she has never smoked. She does not have any smokeless tobacco history on file. She reports that she does not drink alcohol or use drugs.  Current Outpatient Prescriptions on File Prior to Visit  Medication Sig Dispense Refill  . fluticasone (FLONASE) 50 MCG/ACT nasal spray Place 2 sprays into both nostrils daily. 16 g 0  . propranolol (INDERAL) 20 MG tablet Take 1 tablet (20 mg total) by mouth daily as needed (anxiety per pt). 30 tablet 6  . ranitidine (ZANTAC) 150 MG capsule Take 150 mg by mouth daily as needed for heartburn.     No current facility-administered medications on file prior to visit.      Objective:  Objective  Physical Exam  Abdominal: Hernia confirmed negative in the right inguinal area and confirmed negative in the left inguinal  area.  Genitourinary: Vagina normal. Rectal exam shows external hemorrhoid and guaiac positive stool. Rectal exam shows no internal hemorrhoid, no fissure, no mass, no tenderness and anal tone normal. There is no rash, tenderness, lesion or injury on the right labia. There is no rash, tenderness, lesion or injury on the left labia. Uterus is not deviated, not enlarged, not fixed and not tender. Cervix exhibits no motion tenderness, no discharge and no friability. Right adnexum displays no mass, no tenderness and no fullness. Left adnexum displays no mass, no tenderness and no fullness. No vaginal discharge found.  Genitourinary Comments: Rectal heme positive---+ ext hem bleeding  Lymphadenopathy:       Right: No inguinal adenopathy present.       Left: No inguinal adenopathy present.  Vitals reviewed.  BP 118/83   Pulse 71   Temp 98.1 F (36.7 C) (Oral)   Ht 5\' 4"  (1.626 m)   Wt 161 lb 6.4 oz (73.2 kg)   LMP 12/16/2015 Comment: patient is currently having a period.  SpO2 100%   BMI 27.70 kg/m  Wt Readings from Last 3 Encounters:  12/29/15 161 lb 6.4 oz (73.2 kg)  12/22/15 159 lb 3.2 oz (72.2 kg)  11/28/15 154 lb 9.6 oz (70.1 kg)     Lab Results  Component Value Date   WBC 7.8 12/22/2015   HGB 11.7 (L) 12/22/2015   HCT 35.7 (L) 12/22/2015   PLT 201.0 12/22/2015   GLUCOSE 88 12/22/2015   CHOL 162 12/22/2015   TRIG 108.0 12/22/2015   HDL 40.70  12/22/2015   LDLCALC 100 (H) 12/22/2015   ALT 13 12/22/2015   AST 15 12/22/2015   NA 138 12/22/2015   K 4.0 12/22/2015   CL 104 12/22/2015   CREATININE 0.84 12/22/2015   BUN 15 12/22/2015   CO2 27 12/22/2015   TSH 1.33 12/22/2015   HGBA1C 5.3 03/03/2015    US Breast Ltd Uni Left Inc Axilla  Result Date: 12/05/2015 CLINICAL DATA:  Patient describes a palpable abnormality within the inner left breast. EXAM: 2D DIGITAL DIAGNOSTIC LEFT MAMMOGRAM WITH CAD AND ADJUNCT TOMO ULTRASOUND LEFT BREAST COMPARISON:  Previous exams including  baseline mammogram dated 03/14/2015 ACR Breast Density Category c: The breast tissue is heterogeneously dense, which may obscure small masses. FINDINGS: Left breast 2D CC and MLO projections were obtained today, with additional 3D tomosynthesis, and with additional spot compression view of the inner left breast corresponding to the area of clinical concern. There are no new dominant masses, suspicious calcifications or secondary signs of malignancy within the left breast. Specifically, there is no mammographic abnormality within the inner left breast corresponding to the area of clinical concern. Mammographic images were processed with CAD. Targeted ultrasound is performed, evaluating the upper inner quadrant of the left breast corresponding to the area of clinical concern, as directed by the patient, showing only normal fibroglandular tissues and fat lobules throughout. There are no suspicious solid or cystic masses identified by ultrasound. IMPRESSION: No evidence of malignancy within the left breast. RECOMMENDATION: Annual screening mammograms. Next bilateral screening mammogram will be due in January of 2018 in conjunction with patient's routine left breast screening mammogram schedule. The patient was instructed to return sooner if the area that she feels becomes larger and/or firmer to palpation. I have discussed the findings and recommendations with the patient. Results were also provided in writing at the conclusion of the visit. If applicable, a reminder letter will be sent to the patient regarding the next appointment. BI-RADS CATEGORY  1: Negative. Electronically Signed   By: Franki Cabot M.D.   On: 12/05/2015 09:30   Mm Diag Breast Tomo Uni Left  Result Date: 12/05/2015 CLINICAL DATA:  Patient describes a palpable abnormality within the inner left breast. EXAM: 2D DIGITAL DIAGNOSTIC LEFT MAMMOGRAM WITH CAD AND ADJUNCT TOMO ULTRASOUND LEFT BREAST COMPARISON:  Previous exams including baseline  mammogram dated 03/14/2015 ACR Breast Density Category c: The breast tissue is heterogeneously dense, which may obscure small masses. FINDINGS: Left breast 2D CC and MLO projections were obtained today, with additional 3D tomosynthesis, and with additional spot compression view of the inner left breast corresponding to the area of clinical concern. There are no new dominant masses, suspicious calcifications or secondary signs of malignancy within the left breast. Specifically, there is no mammographic abnormality within the inner left breast corresponding to the area of clinical concern. Mammographic images were processed with CAD. Targeted ultrasound is performed, evaluating the upper inner quadrant of the left breast corresponding to the area of clinical concern, as directed by the patient, showing only normal fibroglandular tissues and fat lobules throughout. There are no suspicious solid or cystic masses identified by ultrasound. IMPRESSION: No evidence of malignancy within the left breast. RECOMMENDATION: Annual screening mammograms. Next bilateral screening mammogram will be due in January of 2018 in conjunction with patient's routine left breast screening mammogram schedule. The patient was instructed to return sooner if the area that she feels becomes larger and/or firmer to palpation. I have discussed the findings and recommendations with the patient. Results  were also provided in writing at the conclusion of the visit. If applicable, a reminder letter will be sent to the patient regarding the next appointment. BI-RADS CATEGORY  1: Negative. Electronically Signed   By: Franki Cabot M.D.   On: 12/05/2015 09:30     Assessment & Plan:  Plan  I am having Ms. Eliason maintain her propranolol, ranitidine, and fluticasone.  No orders of the defined types were placed in this encounter.   Problem List Items Addressed This Visit      Unprioritized   External hemorrhoid, bleeding    Pt states prep H works  well.  She just forgot to use it last few days Can refer to GI if it does not improve       Other Visit Diagnoses    Preventative health care    -  Primary   Relevant Orders   Fecal occult blood, imunochemical   Cytology - PAP   Routine cervical smear       Relevant Orders   Cytology - PAP      Follow-up: No Follow-up on file.  [name], DO

## 2015-12-30 LAB — CYTOLOGY - PAP
Diagnosis: NEGATIVE
HPV (WINDOPATH): NOT DETECTED

## 2016-01-04 ENCOUNTER — Ambulatory Visit
Admission: RE | Admit: 2016-01-04 | Discharge: 2016-01-04 | Disposition: A | Discharge status: Home / Self Care | Payer: BLUE CROSS/BLUE SHIELD | Source: Ambulatory Visit | Attending: Family Medicine | Admitting: Family Medicine

## 2016-01-04 ENCOUNTER — Other Ambulatory Visit: Payer: Self-pay | Admitting: Family Medicine

## 2016-01-04 DIAGNOSIS — N63 Unspecified lump in unspecified breast: Secondary | ICD-10-CM

## 2016-01-04 DIAGNOSIS — N6310 Unspecified lump in the right breast, unspecified quadrant: Secondary | ICD-10-CM | POA: Diagnosis not present

## 2016-01-11 ENCOUNTER — Other Ambulatory Visit: Payer: Self-pay | Admitting: Cardiovascular Disease

## 2016-04-20 ENCOUNTER — Telehealth: Payer: Self-pay | Admitting: Family Medicine

## 2016-04-20 ENCOUNTER — Ambulatory Visit (INDEPENDENT_AMBULATORY_CARE_PROVIDER_SITE_OTHER): Payer: BLUE CROSS/BLUE SHIELD | Admitting: Family Medicine

## 2016-04-20 ENCOUNTER — Encounter: Payer: Self-pay | Admitting: Family Medicine

## 2016-04-20 VITALS — BP 120/80 | HR 82 | Temp 98.3°F | Resp 16 | Ht 64.0 in | Wt 158.6 lb

## 2016-04-20 DIAGNOSIS — R002 Palpitations: Secondary | ICD-10-CM | POA: Diagnosis not present

## 2016-04-20 DIAGNOSIS — F430 Acute stress reaction: Secondary | ICD-10-CM

## 2016-04-20 LAB — CBC WITH DIFFERENTIAL/PLATELET
BASOS PCT: 0 %
Basophils Absolute: 0 cells/uL (ref 0–200)
EOS ABS: 92 {cells}/uL (ref 15–500)
Eosinophils Relative: 1 %
HEMATOCRIT: 37.7 % (ref 35.0–45.0)
Hemoglobin: 12 g/dL (ref 11.7–15.5)
Lymphocytes Relative: 26 %
Lymphs Abs: 2392 cells/uL (ref 850–3900)
MCH: 26.3 pg — ABNORMAL LOW (ref 27.0–33.0)
MCHC: 31.8 g/dL — ABNORMAL LOW (ref 32.0–36.0)
MCV: 82.5 fL (ref 80.0–100.0)
MONO ABS: 460 {cells}/uL (ref 200–950)
MPV: 11.4 fL (ref 7.5–12.5)
Monocytes Relative: 5 %
NEUTROS ABS: 6256 {cells}/uL (ref 1500–7800)
Neutrophils Relative %: 68 %
Platelets: 230 10*3/uL (ref 140–400)
RBC: 4.57 MIL/uL (ref 3.80–5.10)
RDW: 15.1 % — ABNORMAL HIGH (ref 11.0–15.0)
WBC: 9.2 10*3/uL (ref 3.8–10.8)

## 2016-04-20 LAB — COMPREHENSIVE METABOLIC PANEL
ALBUMIN: 4.2 g/dL (ref 3.6–5.1)
ALK PHOS: 57 U/L (ref 33–115)
ALT: 11 U/L (ref 6–29)
AST: 13 U/L (ref 10–30)
BUN: 8 mg/dL (ref 7–25)
CALCIUM: 9.1 mg/dL (ref 8.6–10.2)
CO2: 24 mmol/L (ref 20–31)
Chloride: 107 mmol/L (ref 98–110)
Creat: 0.81 mg/dL (ref 0.50–1.10)
Glucose, Bld: 92 mg/dL (ref 65–99)
POTASSIUM: 4.6 mmol/L (ref 3.5–5.3)
Sodium: 139 mmol/L (ref 135–146)
Total Bilirubin: 0.2 mg/dL (ref 0.2–1.2)
Total Protein: 7.3 g/dL (ref 6.1–8.1)

## 2016-04-20 LAB — TSH: TSH: 0.93 m[IU]/L

## 2016-04-20 NOTE — Patient Instructions (Signed)
Palpitations A palpitation is the feeling that your heartbeat is irregular or is faster than normal. It may feel like your heart is fluttering or skipping a beat. Palpitations are usually not a serious problem. They may be caused by many things, including smoking, caffeine, alcohol, stress, and certain medicines. Although most causes of palpitations are not serious, palpitations can be a sign of a serious medical problem. In some cases, you may need further medical evaluation. Follow these instructions at home: Pay attention to any changes in your symptoms. Take these actions to help with your condition:  Avoid the following: ? Caffeinated coffee, tea, soft drinks, diet pills, and energy drinks. ? Chocolate. ? Alcohol.  Do not use any tobacco products, such as cigarettes, chewing tobacco, and e-cigarettes. If you need help quitting, ask your health care provider.  Try to reduce your stress and anxiety. Things that can help you relax include: ? Yoga. ? Meditation. ? Physical activity, such as swimming, jogging, or walking. ? Biofeedback. This is a method that helps you learn to use your mind to control things in your body, such as your heartbeats.  Get plenty of rest and sleep.  Take over-the-counter and prescription medicines only as told by your health care provider.  Keep all follow-up visits as told by your health care provider. This is important.  Contact a health care provider if:  You continue to have a fast or irregular heartbeat after 24 hours.  Your palpitations occur more often. Get help right away if:  You have chest pain or shortness of breath.  You have a severe headache.  You feel dizzy or you faint. This information is not intended to replace advice given to you by your health care provider. Make sure you discuss any questions you have with your health care provider. Document Released: 01/27/2000 Document Revised: 07/04/2015 Document Reviewed: 10/14/2014 Elsevier  Interactive Patient Education  2017 [employer]  

## 2016-04-20 NOTE — Progress Notes (Signed)
Pre visit review using our clinic review tool, if applicable. No additional management support is needed unless otherwise documented below in the visit note. 

## 2016-04-20 NOTE — Telephone Encounter (Signed)
FYI

## 2016-04-20 NOTE — Telephone Encounter (Signed)
[facility]  Patient Name: Rachel Wiley  DOB: [birth year]    Initial Comment Caller states PT: has elevated BP. Current BP: 146/90.    Nurse Assessment  Nurse: Harlow Mares, RN, Suanne Marker Date/Time (Eastern Time): 04/20/2016 12:01:45 PM  Confirm and document reason for call. If symptomatic, describe symptoms. ---Caller states PT: has elevated BP. Current BP: 146/95-103. Reports that she has tachycardia and takes Procardia. Reports that she felt like she was going to pass out earlier after lunch, but this has gone away. Her hands are a little cold, she is feeling better. She was also having some SOB with chest discomfort during this episode.  Does the patient have any new or worsening symptoms? ---Yes  Will a triage be completed? ---Yes  Related visit to physician within the last 2 weeks? ---No  Does the PT have any chronic conditions? (i.e. diabetes, asthma, etc.) ---Yes  List chronic conditions. ---tachycardia, acid reflux  Is the patient pregnant or possibly pregnant? (Ask all females between the ages of 37-55) ---No  Is this a behavioral health or substance abuse call? ---No     Guidelines    Guideline Title Affirmed Question Affirmed Notes  Chest Pain [1] Chest pain lasting <= 5 minutes AND [2] NO chest pain or cardiac symptoms now (Exceptions: pains lasting a few seconds)    Final Disposition User   See Physician within 24 Hours [place], RN, Suanne Marker    Comments  Appt scheduled for today with Dr. Etter Sjogren at 2:45pm at the [facility] office.   Referrals  REFERRED TO PCP OFFICE   Disagree/Comply: Comply
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
## 2016-04-26 ENCOUNTER — Ambulatory Visit (INDEPENDENT_AMBULATORY_CARE_PROVIDER_SITE_OTHER): Payer: BLUE CROSS/BLUE SHIELD

## 2016-04-26 DIAGNOSIS — R002 Palpitations: Secondary | ICD-10-CM

## 2016-05-29 ENCOUNTER — Encounter: Payer: Self-pay | Admitting: Cardiovascular Disease

## 2016-06-15 ENCOUNTER — Encounter: Payer: Self-pay | Admitting: *Deleted

## 2016-06-15 ENCOUNTER — Ambulatory Visit (INDEPENDENT_AMBULATORY_CARE_PROVIDER_SITE_OTHER): Payer: BLUE CROSS/BLUE SHIELD | Admitting: Cardiovascular Disease

## 2016-06-15 ENCOUNTER — Encounter: Payer: Self-pay | Admitting: Cardiovascular Disease

## 2016-06-15 VITALS — BP 112/80 | HR 75 | Ht 64.0 in | Wt 155.8 lb

## 2016-06-15 DIAGNOSIS — R002 Palpitations: Secondary | ICD-10-CM | POA: Diagnosis not present

## 2016-06-15 NOTE — Progress Notes (Signed)
Patient ID: Rachel Wiley, female   DOB: 1975/10/23, 41 y.o.   MRN: [id number]   41 y.o.  referred by Dr Etter Sjogren for palpitatins and dyspnea in 2012 . Improved with Inderal 40mg  but this was stopped a couple of years ago  Last couple of months occasional episodes of palpitations and noticing her HR. Seems to have a propensity for exaggerated HR response to exercise. Reviewed labs by Dr Etter Sjogren and normal with no anemia or thyroid disease. Some stress with living arrangements as her and her husband have been back and forth to [place]. No SSCP and mild dyspnea with walking. No stimulants and only 2 cups of tea /day. Has not had SSCP, presyncope, edema. Sometimes headaches have made her pulse faster.  Used minoxidil for hair growth 2 weeks Started having labile BP palpitations and chest pain with dizzyness Inderal would help some Just hasn't felt well.  Pain in chest fleeting sharp can be positional  Mild dyspnea In retrospect also appears that zyrtec made her BP and pulse go up  Symptoms better off minoxidil  01/2015 had normal stress echo  More palpitations March Event monitor benign taking inderal sporadically  ROS: Denies fever, malais, weight loss, blurry vision, decreased visual acuity, cough, sputum, SOB, hemoptysis, pleuritic pain, palpitaitons, heartburn, abdominal pain, melena, lower extremity edema, claudication, or rash.  All other systems reviewed and negative  General: Affect appropriate Healthy:  appears stated age 41: normal Neck supple with no adenopathy JVP normal no bruits no thyromegaly Lungs clear with no wheezing and good diaphragmatic motion Heart:  S1/S2 no murmur, no rub, gallop or click PMI normal Abdomen: benighn, BS positve, no tenderness, no AAA no bruit.  No HSM or HJR Distal pulses intact with no bruits No edema Neuro non-focal Skin warm and dry No muscular weakness   Current Outpatient Prescriptions  Medication Sig Dispense Refill  . Ferrous Sulfate (IRON  SUPPLEMENT PO) Take 1 tablet by mouth daily.    . fluticasone (FLONASE) 50 MCG/ACT nasal spray Place 2 sprays into both nostrils daily. 16 g 0  . propranolol (INDERAL) 20 MG tablet TAKE 1 TABLET (20 MG TOTAL) BY MOUTH DAILY AS NEEDED (ANXIETY PER PT). 30 tablet 5  . ranitidine (ZANTAC) 150 MG capsule Take 150 mg by mouth daily as needed for heartburn.     No current facility-administered medications for this visit.     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

## 2016-06-15 NOTE — Patient Instructions (Signed)
Medication Instructions:  None  Labwork: None  Testing/Procedures: None  Follow-Up: Your physician wants you to follow-up in: 1 year with Dr. Johnsie Cancel.  You will receive a reminder letter in the mail two months in advance. If you don't receive a letter, please call our office to schedule the follow-up appointment.   Any Other Special Instructions Will Be Listed Below (If Applicable).     If you need a refill on your cardiac medications before your next appointment, please call your pharmacy.

## 2016-11-09 ENCOUNTER — Encounter: Payer: Self-pay | Admitting: Family Medicine

## 2016-11-09 ENCOUNTER — Ambulatory Visit (INDEPENDENT_AMBULATORY_CARE_PROVIDER_SITE_OTHER): Payer: BLUE CROSS/BLUE SHIELD | Admitting: Family Medicine

## 2016-11-09 DIAGNOSIS — M25561 Pain in right knee: Secondary | ICD-10-CM

## 2016-11-09 NOTE — Patient Instructions (Signed)
You have patellofemoral syndrome. Avoid painful activities when possible (often deep squats, lunges, leg press bother this). Cross train with swimming, cycling with low resistance, elliptical if needed. Straight leg raise, hip side raises, straight leg raises with foot turned outwards 3 sets of 10 once a day. Add ankle weight if these become too easy. Consider formal physical therapy. Correct foot breakdown with something like dr. Zoe Lan active series, spencos, superfeet, or our green sports insoles. Avoid flat shoes, barefoot walking as much as possible. Icing 15 minutes at a time 3-4 times a day as needed. Tylenol or ibuprofen as needed for pain. Follow up with me in 5-6 weeks.

## 2016-11-11 DIAGNOSIS — M25561 Pain in right knee: Secondary | ICD-10-CM | POA: Insufficient documentation

## 2016-11-11 NOTE — Progress Notes (Signed)
PCP: [name], DO  Subjective:   HPI: Patient is a 41 y.o. female here for right knee pain.  Patient reports about 3 weeks ago after she got up when working out she felt pain anterior right knee. No swelling or bruising. No prior issues with this knee. No treatment to date. Pain up to 0.5/10 with stairs, dull, better with rest. No skin changes, numbness.  Past Medical History:  Diagnosis Date  . Anxiety   . Headache(784.0)   . Lipoma of unspecified site   . Other acute reactions to stress   . Palpitations   . SVD (spontaneous vaginal delivery) 06/18/2011  . Tachycardia     Current Outpatient Prescriptions on File Prior to Visit  Medication Sig Dispense Refill  . Ferrous Sulfate (IRON SUPPLEMENT PO) Take 1 tablet by mouth daily.    . fluticasone (FLONASE) 50 MCG/ACT nasal spray Place 2 sprays into both nostrils daily. 16 g 0  . propranolol (INDERAL) 20 MG tablet TAKE 1 TABLET (20 MG TOTAL) BY MOUTH DAILY AS NEEDED (ANXIETY PER PT). 30 tablet 5  . ranitidine (ZANTAC) 150 MG capsule Take 150 mg by mouth daily as needed for heartburn.     No current facility-administered medications on file prior to visit.     Past Surgical History:  Procedure Laterality Date  . NO PAST SURGERIES      Allergies  Allergen Reactions  . Cetirizine & Related Palpitations    Per pt    Social History   Social History  . Marital status: Married    Spouse name: N/A  . Number of children: N/A  . Years of education: N/A   Occupational History  .      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 Smoking status: Never Smoker  . Smokeless tobacco: Never Used  . Alcohol use No  . Drug use: No  . Sexual activity: Yes   Other Topics Concern  . Not on file   Social History Narrative   Never Smoked   Alcohol use-no   Drug use-no   Smoking Status:  never   Drug Use:  No   Exercise--- walking             Family History  Problem Relation Age of Onset  .  Hypertension Mother   . Hypertension Unknown   . Anesthesia problems Neg Hx   . Hypotension Neg Hx   . Malignant hyperthermia Neg Hx   . Pseudochol deficiency Neg Hx     BP (!) 143/87   Ht 5\' 4"  (1.626 m)   Wt 154 lb (69.9 kg)   BMI 26.43 kg/m   Review of Systems: See HPI above.     Objective:  Physical Exam:  Gen: NAD, comfortable in exam room  Right knee: No gross deformity, ecchymoses, swelling.  Mild VMO atrophy.  Patellar shift from flexion to extension. No TTP. FROM. Negative ant/post drawers. Negative valgus/varus testing. Negative lachmanns. Negative mcmurrays, apleys, patellar apprehension. NV intact distally.  Left knee: FROM without pain.   Assessment & Plan:  1. Right knee pain - consistent with patellofemoral syndrome.  Shown home exercises to do daily.  Arch supports reviewed.  Avoid flat shoes, barefoot walking.  Icing, tylenol or ibuprofen if needed.  Consider physical therapy, custom orthotics if not improving.  F/u in 5-6 weeks.

## 2016-11-11 NOTE — Assessment & Plan Note (Signed)
consistent with patellofemoral syndrome.  Shown home exercises to do daily.  Arch supports reviewed.  Avoid flat shoes, barefoot walking.  Icing, tylenol or ibuprofen if needed.  Consider physical therapy, custom orthotics if not improving.  F/u in 5-6 weeks.

## 2016-12-14 ENCOUNTER — Other Ambulatory Visit: Payer: Self-pay | Admitting: Family

## 2016-12-21 ENCOUNTER — Encounter: Payer: Self-pay | Admitting: Family Medicine

## 2016-12-21 ENCOUNTER — Ambulatory Visit: Payer: BLUE CROSS/BLUE SHIELD | Admitting: Family Medicine

## 2016-12-21 DIAGNOSIS — M25561 Pain in right knee: Secondary | ICD-10-CM

## 2016-12-21 NOTE — Progress Notes (Signed)
PCP: [name], DO  Subjective:   HPI: Patient is a 41 y.o. female here for right knee pain.  9/28: Patient reports about 3 weeks ago after she got up when working out she felt pain anterior right knee. No swelling or bruising. No prior issues with this knee. No treatment to date. Pain up to 0.5/10 with stairs, dull, better with rest. No skin changes, numbness.  11/9: Patient reports she's doing well. Doing home exercises. Deep squats will bother her some. Pain level 1/10 at worst. No skin changes, numbness.  Past Medical History:  Diagnosis Date  . Anxiety   . Headache(784.0)   . Lipoma of unspecified site   . Other acute reactions to stress   . Palpitations   . SVD (spontaneous vaginal delivery) 06/18/2011  . Tachycardia     Current Outpatient Medications on File Prior to Visit  Medication Sig Dispense Refill  . Ferrous Sulfate (IRON SUPPLEMENT PO) Take 1 tablet by mouth daily.    . fluticasone (FLONASE) 50 MCG/ACT nasal spray PLACE 2 SPRAYS INTO BOTH NOSTRILS DAILY. 16 g 3  . propranolol (INDERAL) 20 MG tablet TAKE 1 TABLET (20 MG TOTAL) BY MOUTH DAILY AS NEEDED (ANXIETY PER PT). 30 tablet 5  . ranitidine (ZANTAC) 150 MG capsule Take 150 mg by mouth daily as needed for heartburn.     No current facility-administered medications on file prior to visit.     Past Surgical History:  Procedure Laterality Date  . NO PAST SURGERIES      Allergies  Allergen Reactions  . Cetirizine & Related Palpitations    Per pt    Social History   Socioeconomic History  . Marital status: Married    Spouse name: Not on file  . Number of children: Not on file  . Years of education: Not on file  . Highest education level: Not on file  Social Needs  . Financial resource strain: Not on file  . Food insecurity - worry: Not on file  . Food insecurity - inability: Not on file  . Transportation needs - medical: Not on file  . 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 Smoking status: Never Smoker  . Smokeless tobacco: Never Used  Substance and Sexual Activity  . Alcohol use: No  . Drug use: No  . Sexual activity: Yes  Other Topics Concern  . Not on file  Social History Narrative   Never Smoked   Alcohol use-no   Drug use-no   Smoking Status:  never   Drug Use:  No   Exercise--- walking             Family History  Problem Relation Age of Onset  . Hypertension Mother   . Hypertension Unknown   . Anesthesia problems Neg Hx   . Hypotension Neg Hx   . Malignant hyperthermia Neg Hx   . Pseudochol deficiency Neg Hx     BP 134/72   Pulse 92   Ht 5\' 4"  (1.626 m)   Wt 153 lb (69.4 kg)   BMI 26.26 kg/m   Review of Systems: See HPI above.     Objective:  Physical Exam:  Gen: NAD, comfortable in exam room.  Right knee: No gross deformity, ecchymoses, swelling.  Mild VMO atrophy. No TTP. FROM. Negative ant/post drawers. Negative valgus/varus testing. Negative lachmanns. Negative mcmurrays, apleys, patellar apprehension. NV intact distally.  Assessment & Plan:  1. Right knee pain - 2/2 patellofemoral syndrome.  Improving with home exercise program - continue for 6 weeks.  Icing, tylenol or ibuprofen if needed.  F/u prn.

## 2016-12-21 NOTE — Patient Instructions (Signed)
You're doing great! Do the home exercises 2-3 times a week for 6 more weeks. Call me if you have any questions otherwise follow up as needed.

## 2016-12-21 NOTE — Assessment & Plan Note (Signed)
2/2 patellofemoral syndrome.  Improving with home exercise program - continue for 6 weeks.  Icing, tylenol or ibuprofen if needed.  F/u prn.

## 2017-01-09 ENCOUNTER — Telehealth: Payer: Self-pay | Admitting: Family Medicine

## 2017-01-09 DIAGNOSIS — Z0000 Encounter for general adult medical examination without abnormal findings: Secondary | ICD-10-CM

## 2017-01-09 NOTE — Telephone Encounter (Signed)
Please place future labs for CPE on 03/11/2016 appt at 2:30pm. Lab appt is that morning at 8am. Thank you!

## 2017-01-09 NOTE — Telephone Encounter (Signed)
Done

## 2017-01-14 ENCOUNTER — Encounter: Payer: Self-pay | Admitting: Family Medicine

## 2017-03-11 ENCOUNTER — Other Ambulatory Visit: Payer: BLUE CROSS/BLUE SHIELD

## 2017-03-11 ENCOUNTER — Encounter: Payer: BLUE CROSS/BLUE SHIELD | Admitting: Family Medicine

## 2017-03-18 ENCOUNTER — Encounter: Payer: Self-pay | Admitting: Family Medicine

## 2017-03-18 NOTE — Telephone Encounter (Signed)
We can either wait to her cpe and check for it in the labs or she can come in sooner to get it done

## 2017-03-21 NOTE — Telephone Encounter (Signed)
Yes--- ok to add it to her labs for that day

## 2017-03-21 NOTE — Telephone Encounter (Signed)
Do you want just a hepatitis panel?

## 2017-03-21 NOTE — Telephone Encounter (Signed)
Hep B suf antigen and anti hBsurface

## 2017-03-25 ENCOUNTER — Other Ambulatory Visit: Payer: Self-pay | Admitting: Family Medicine

## 2017-03-25 DIAGNOSIS — Z205 Contact with and (suspected) exposure to viral hepatitis: Secondary | ICD-10-CM

## 2017-03-25 NOTE — Telephone Encounter (Signed)
I have put the order in  She just needs to make a lab appointment

## 2017-03-27 ENCOUNTER — Other Ambulatory Visit (INDEPENDENT_AMBULATORY_CARE_PROVIDER_SITE_OTHER): Payer: BLUE CROSS/BLUE SHIELD

## 2017-03-27 ENCOUNTER — Ambulatory Visit: Payer: BLUE CROSS/BLUE SHIELD

## 2017-03-27 DIAGNOSIS — Z205 Contact with and (suspected) exposure to viral hepatitis: Secondary | ICD-10-CM

## 2017-03-28 LAB — HEPATITIS B SURFACE ANTIGEN: HEP B S AG: NONREACTIVE

## 2017-03-28 LAB — HEPATITIS B SURFACE ANTIBODY,QUALITATIVE: HEP B S AB: REACTIVE — AB

## 2017-03-28 LAB — HEPATITIS B CORE ANTIBODY, TOTAL: HEP B C TOTAL AB: NONREACTIVE

## 2017-04-01 NOTE — Telephone Encounter (Signed)
Patient coming in for ov and lab appointment 04/02/17.

## 2017-04-02 ENCOUNTER — Encounter: Payer: Self-pay | Admitting: Family Medicine

## 2017-04-02 ENCOUNTER — Ambulatory Visit (INDEPENDENT_AMBULATORY_CARE_PROVIDER_SITE_OTHER): Payer: BLUE CROSS/BLUE SHIELD | Admitting: Family Medicine

## 2017-04-02 ENCOUNTER — Other Ambulatory Visit (INDEPENDENT_AMBULATORY_CARE_PROVIDER_SITE_OTHER): Payer: BLUE CROSS/BLUE SHIELD

## 2017-04-02 VITALS — BP 110/70 | HR 71 | Temp 98.0°F | Resp 16 | Ht 64.0 in | Wt 157.4 lb

## 2017-04-02 DIAGNOSIS — Z0000 Encounter for general adult medical examination without abnormal findings: Secondary | ICD-10-CM

## 2017-04-02 DIAGNOSIS — R319 Hematuria, unspecified: Secondary | ICD-10-CM | POA: Diagnosis not present

## 2017-04-02 DIAGNOSIS — R82998 Other abnormal findings in urine: Secondary | ICD-10-CM

## 2017-04-02 DIAGNOSIS — R8290 Unspecified abnormal findings in urine: Secondary | ICD-10-CM

## 2017-04-02 LAB — POC URINALSYSI DIPSTICK (AUTOMATED)
BILIRUBIN UA: NEGATIVE
GLUCOSE UA: NEGATIVE
KETONES UA: NEGATIVE
Nitrite, UA: NEGATIVE
PH UA: 7.5 (ref 5.0–8.0)
Protein, UA: NEGATIVE
Spec Grav, UA: 1.01 (ref 1.010–1.025)
Urobilinogen, UA: 0.2 E.U./dL

## 2017-04-02 LAB — CBC WITH DIFFERENTIAL/PLATELET
Basophils Absolute: 0 10*3/uL (ref 0.0–0.1)
Basophils Relative: 0.5 % (ref 0.0–3.0)
EOS PCT: 5 % (ref 0.0–5.0)
Eosinophils Absolute: 0.3 10*3/uL (ref 0.0–0.7)
HEMATOCRIT: 34.5 % — AB (ref 36.0–46.0)
Hemoglobin: 11.1 g/dL — ABNORMAL LOW (ref 12.0–15.0)
LYMPHS PCT: 38.1 % (ref 12.0–46.0)
Lymphs Abs: 2.2 10*3/uL (ref 0.7–4.0)
MCHC: 32.3 g/dL (ref 30.0–36.0)
MCV: 79.8 fl (ref 78.0–100.0)
MONOS PCT: 7.6 % (ref 3.0–12.0)
Monocytes Absolute: 0.4 10*3/uL (ref 0.1–1.0)
NEUTROS ABS: 2.8 10*3/uL (ref 1.4–7.7)
Neutrophils Relative %: 48.8 % (ref 43.0–77.0)
Platelets: 172 10*3/uL (ref 150.0–400.0)
RBC: 4.32 Mil/uL (ref 3.87–5.11)
RDW: 16.3 % — ABNORMAL HIGH (ref 11.5–15.5)
WBC: 5.7 10*3/uL (ref 4.0–10.5)

## 2017-04-02 LAB — COMPREHENSIVE METABOLIC PANEL
ALK PHOS: 45 U/L (ref 39–117)
ALT: 12 U/L (ref 0–35)
AST: 13 U/L (ref 0–37)
Albumin: 3.8 g/dL (ref 3.5–5.2)
BUN: 11 mg/dL (ref 6–23)
CO2: 30 meq/L (ref 19–32)
Calcium: 9.1 mg/dL (ref 8.4–10.5)
Chloride: 105 mEq/L (ref 96–112)
Creatinine, Ser: 0.78 mg/dL (ref 0.40–1.20)
GFR: 86.38 mL/min (ref >60.00)
GLUCOSE: 92 mg/dL (ref 70–99)
POTASSIUM: 4.4 meq/L (ref 3.5–5.1)
Sodium: 138 mEq/L (ref 135–145)
Total Bilirubin: 0.3 mg/dL (ref 0.2–1.2)
Total Protein: 7 g/dL (ref 6.0–8.3)

## 2017-04-02 LAB — LIPID PANEL
CHOL/HDL RATIO: 4
Cholesterol: 146 mg/dL (ref 0–200)
HDL: 33.7 mg/dL — AB (ref >39.00)
LDL Cholesterol: 91 mg/dL (ref 0–99)
NONHDL: 112.75
Triglycerides: 108 mg/dL (ref 0.0–149.0)
VLDL: 21.6 mg/dL (ref 0.0–40.0)

## 2017-04-02 LAB — TSH: TSH: 1.4 u[IU]/mL (ref 0.35–4.50)

## 2017-04-02 NOTE — Progress Notes (Signed)
Subjective:     Rachel Wiley is a 42 y.o. female and is here for a comprehensive physical exam. The patient reports no problems.  Social History   Socioeconomic History  . Marital status: Married    Spouse name: Not on file  . Number of children: Not on file  . Years of education: Not on file  . Highest education level: Not on file  Social Needs  . Financial resource strain: Not on file  . Food insecurity - worry: Not on file  . Food insecurity - inability: Not on file  . Transportation needs - medical: Not on file  . 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 Smoking status: Never Smoker  . Smokeless tobacco: Never Used  Substance and Sexual Activity  . Alcohol use: No  . Drug use: No  . Sexual activity: Yes  Other Topics Concern  . Not on file  Social History Narrative   Never Smoked   Alcohol use-no   Drug use-no   Smoking Status:  never   Drug Use:  No   Exercise--- walking            Health Maintenance  Topic Date Due  . PAP SMEAR  12/29/2018  . TETANUS/TDAP  06/18/2021  . INFLUENZA VACCINE  Completed  . HIV Screening  Completed    The following portions of the patient's history were reviewed and updated as appropriate:  She  has a past medical history of Anxiety, Headache(784.0), Lipoma of unspecified site, Other acute reactions to stress, Palpitations, SVD (spontaneous vaginal delivery) (06/18/2011), and Tachycardia. She does not have any pertinent problems on file. She  has a past surgical history that includes No past surgeries. Her family history includes Hypertension in her mother and unknown relative. She  reports that  has never smoked. she has never used smokeless tobacco. She reports that she does not drink alcohol or use drugs. She has a current medication list which includes the following prescription(s): ferrous sulfate, fluticasone, propranolol, and  ranitidine. Current Outpatient Medications on File Prior to Visit  Medication Sig Dispense Refill  . Ferrous Sulfate (IRON SUPPLEMENT PO) Take 1 tablet by mouth daily.    . fluticasone (FLONASE) 50 MCG/ACT nasal spray PLACE 2 SPRAYS INTO BOTH NOSTRILS DAILY. 16 g 3  . propranolol (INDERAL) 20 MG tablet TAKE 1 TABLET (20 MG TOTAL) BY MOUTH DAILY AS NEEDED (ANXIETY PER PT). 30 tablet 5  . ranitidine (ZANTAC) 150 MG capsule Take 150 mg by mouth daily as needed for heartburn.     No current facility-administered medications on file prior to visit.    She is allergic to cetirizine & related..  Review of Systems Review of Systems  Constitutional: Negative for activity change, appetite change and fatigue.  HENT: Negative for hearing loss, congestion, tinnitus and ear discharge.  dentist q70m Eyes: Negative for visual disturbance (see optho q1y -- vision corrected to 20/20 with glasses).  Respiratory: Negative for cough, chest tightness and shortness of breath.   Cardiovascular: Negative for chest pain, palpitations and leg swelling.  Gastrointestinal: Negative for abdominal pain, diarrhea, constipation and abdominal distention.  Genitourinary: Negative for urgency, frequency, decreased urine volume and difficulty urinating.  Musculoskeletal: Negative for back pain, arthralgias and gait problem.  Skin: Negative for color change, pallor and rash.  Neurological: Negative for dizziness, light-headedness, numbness and headaches.  Hematological: Negative for adenopathy. Does not bruise/bleed easily.  Psychiatric/Behavioral: Negative for suicidal ideas, confusion, sleep disturbance, self-injury, dysphoric mood, decreased concentration and agitation.       Objective:    BP 110/70 (BP Location: Left Arm, Cuff Size: Normal)   Pulse 71   Temp 98 F (36.7 C) (Oral)   Resp 16   Ht 5\' 4"  (1.626 m)   Wt 157 lb 6.4 oz (71.4 kg)   LMP 03/28/2017   SpO2 97%   BMI 27.02 kg/m  General appearance:  alert, cooperative, appears stated age and no distress Head: Normocephalic, without obvious abnormality, atraumatic Eyes: conjunctivae/corneas clear. PERRL, EOM's intact. Fundi benign. Ears: normal TM's and external ear canals both ears Nose: Nares normal. Septum midline. Mucosa normal. No drainage or sinus tenderness. Throat: lips, mucosa, and tongue normal; teeth and gums normal Neck: no adenopathy, no carotid bruit, no JVD, supple, symmetrical, trachea midline and thyroid not enlarged, symmetric, no tenderness/mass/nodules Back: symmetric, no curvature. ROM normal. No CVA tenderness. Lungs: clear to auscultation bilaterally Breasts: normal appearance, no masses or tenderness Heart: regular rate and rhythm, S1, S2 normal, no murmur, click, rub or gallop Abdomen: soft, non-tender; bowel sounds normal; no masses,  no organomegaly Pelvic: deferred Extremities: extremities normal, atraumatic, no cyanosis or edema Pulses: 2+ and symmetric Skin: Skin color, texture, turgor normal. No rashes or lesions Lymph nodes: Cervical, supraclavicular, and axillary nodes normal. Neurologic: Alert and oriented X 3, normal strength and tone. Normal symmetric reflexes. Normal coordination and gait    Assessment:    Healthy female exam.      Plan:    ghm utd Check labs See After Visit Summary for Counseling Recommendations   Labs reviewed with pt

## 2017-04-02 NOTE — Addendum Note (Signed)
Addended by: Harl Bowie on: 04/02/2017 09:54 AM   Modules accepted: Orders

## 2017-04-02 NOTE — Patient Instructions (Signed)
Preventive Care 40-64 Years, Female Preventive care refers to lifestyle choices and visits with your health care provider that can promote health and wellness. What does preventive care include?  A yearly physical exam. This is also called an annual well check.  Dental exams once or twice a year.  Routine eye exams. Ask your health care provider how often you should have your eyes checked.  Personal lifestyle choices, including: ? Daily care of your teeth and gums. ? Regular physical activity. ? Eating a healthy diet. ? Avoiding tobacco and drug use. ? Limiting alcohol use. ? Practicing safe sex. ? Taking low-dose aspirin daily starting at age 58. ? Taking vitamin and mineral supplements as recommended by your health care provider. What happens during an annual well check? The services and screenings done by your health care provider during your annual well check will depend on your age, overall health, lifestyle risk factors, and family history of disease. Counseling Your health care provider may ask you questions about your:  Alcohol use.  Tobacco use.  Drug use.  Emotional well-being.  Home and relationship well-being.  Sexual activity.  Eating habits.  Work and work Statistician.  Method of birth control.  Menstrual cycle.  Pregnancy history.  Screening You may have the following tests or measurements:  Height, weight, and BMI.  Blood pressure.  Lipid and cholesterol levels. These may be checked every 5 years, or more frequently if you are over 81 years old.  Skin check.  Lung cancer screening. You may have this screening every year starting at age 78 if you have a 30-pack-year history of smoking and currently smoke or have quit within the past 15 years.  Fecal occult blood test (FOBT) of the stool. You may have this test every year starting at age 65.  Flexible sigmoidoscopy or colonoscopy. You may have a sigmoidoscopy every 5 years or a colonoscopy  every 10 years starting at age 30.  Hepatitis C blood test.  Hepatitis B blood test.  Sexually transmitted disease (STD) testing.  Diabetes screening. This is done by checking your blood sugar (glucose) after you have not eaten for a while (fasting). You may have this done every 1-3 years.  Mammogram. This may be done every 1-2 years. Talk to your health care provider about when you should start having regular mammograms. This may depend on whether you have a family history of breast cancer.  BRCA-related cancer screening. This may be done if you have a family history of breast, ovarian, tubal, or peritoneal cancers.  Pelvic exam and Pap test. This may be done every 3 years starting at age 80. Starting at age 36, this may be done every 5 years if you have a Pap test in combination with an HPV test.  Bone density scan. This is done to screen for osteoporosis. You may have this scan if you are at high risk for osteoporosis.  Discuss your test results, treatment options, and if necessary, the need for more tests with your health care provider. Vaccines Your health care provider may recommend certain vaccines, such as:  Influenza vaccine. This is recommended every year.  Tetanus, diphtheria, and acellular pertussis (Tdap, Td) vaccine. You may need a Td booster every 10 years.  Varicella vaccine. You may need this if you have not been vaccinated.  Zoster vaccine. You may need this after age 5.  Measles, mumps, and rubella (MMR) vaccine. You may need at least one dose of MMR if you were born in  1957 or later. You may also need a second dose.  Pneumococcal 13-valent conjugate (PCV13) vaccine. You may need this if you have certain conditions and were not previously vaccinated.  Pneumococcal polysaccharide (PPSV23) vaccine. You may need one or two doses if you smoke cigarettes or if you have certain conditions.  Meningococcal vaccine. You may need this if you have certain  conditions.  Hepatitis A vaccine. You may need this if you have certain conditions or if you travel or work in places where you may be exposed to hepatitis A.  Hepatitis B vaccine. You may need this if you have certain conditions or if you travel or work in places where you may be exposed to hepatitis B.  Haemophilus influenzae type b (Hib) vaccine. You may need this if you have certain conditions.  Talk to your health care provider about which screenings and vaccines you need and how often you need them. This information is not intended to replace advice given to you by your health care provider. Make sure you discuss any questions you have with your health care provider. Document Released: 02/25/2015 Document Revised: 10/19/2015 Document Reviewed: 11/30/2014 Elsevier Interactive Patient Education  2018 Elsevier Inc.  

## 2017-04-04 LAB — URINE CULTURE
MICRO NUMBER: 90218101
SPECIMEN QUALITY:: ADEQUATE

## 2017-04-15 ENCOUNTER — Other Ambulatory Visit: Payer: Self-pay | Admitting: Family Medicine

## 2017-04-15 DIAGNOSIS — Z1231 Encounter for screening mammogram for malignant neoplasm of breast: Secondary | ICD-10-CM

## 2017-04-19 ENCOUNTER — Ambulatory Visit (HOSPITAL_BASED_OUTPATIENT_CLINIC_OR_DEPARTMENT_OTHER)
Admission: RE | Admit: 2017-04-19 | Discharge: 2017-04-19 | Disposition: A | Discharge status: Home / Self Care | Payer: BLUE CROSS/BLUE SHIELD | Source: Ambulatory Visit | Attending: Family Medicine | Admitting: Family Medicine

## 2017-04-19 DIAGNOSIS — Z1231 Encounter for screening mammogram for malignant neoplasm of breast: Secondary | ICD-10-CM | POA: Diagnosis not present

## 2017-06-04 ENCOUNTER — Telehealth: Payer: Self-pay | Admitting: Cardiovascular Disease

## 2017-06-04 ENCOUNTER — Other Ambulatory Visit: Payer: Self-pay | Admitting: *Deleted

## 2017-06-04 MED ORDER — PROPRANOLOL HCL 20 MG PO TABS: 20.0000 mg | ORAL_TABLET | Freq: Every day | ORAL | 5 refills | Status: DC | PRN

## 2017-06-04 NOTE — Telephone Encounter (Signed)
New message     *STAT* If patient is at the pharmacy, call can be transferred to refill team.   1. Which medications need to be refilled? (please list name of each medication and dose if known) propranolol (INDERAL) 20 MG tablet  2. Which pharmacy/location (including street and city if local pharmacy) is medication to be sent to?[id number] - [place], [place]  3. Do they need a 30 day or 90 day supply? [place]

## 2017-06-12 ENCOUNTER — Encounter: Payer: Self-pay | Admitting: Family Medicine

## 2017-06-12 ENCOUNTER — Other Ambulatory Visit: Payer: Self-pay | Admitting: Family Medicine

## 2017-06-12 DIAGNOSIS — Z0184 Encounter for antibody response examination: Secondary | ICD-10-CM

## 2017-06-12 DIAGNOSIS — Z789 Other specified health status: Principal | ICD-10-CM

## 2017-06-12 NOTE — Telephone Encounter (Signed)
We can do hep b titre She just needs lab appointment

## 2017-06-20 ENCOUNTER — Other Ambulatory Visit (INDEPENDENT_AMBULATORY_CARE_PROVIDER_SITE_OTHER): Payer: BLUE CROSS/BLUE SHIELD

## 2017-06-20 DIAGNOSIS — Z789 Other specified health status: Secondary | ICD-10-CM

## 2017-06-20 DIAGNOSIS — Z0184 Encounter for antibody response examination: Secondary | ICD-10-CM

## 2017-06-21 LAB — HEPATITIS B SURFACE ANTIBODY,QUALITATIVE: Hep B S Ab: REACTIVE — AB

## 2017-06-28 ENCOUNTER — Other Ambulatory Visit: Payer: Self-pay | Admitting: Family Medicine

## 2017-06-28 ENCOUNTER — Other Ambulatory Visit: Payer: Self-pay

## 2017-06-28 ENCOUNTER — Telehealth: Payer: Self-pay

## 2017-06-28 DIAGNOSIS — Z205 Contact with and (suspected) exposure to viral hepatitis: Secondary | ICD-10-CM

## 2017-06-28 NOTE — Telephone Encounter (Signed)
Patient called stating her husband has Hep B. She recently had bloodwork done to check for immunity. Result came back Reactive. She states another doctor told her "if the number came back 10% she would need a booster" she is now requesting a test that gives [place] the exact numbers of the test results. Spoke with Marita Kansas in the lab and she states she is unsure of another test to order with those exact numbers. I told her I would speak to PCP but that is the only test we have.

## 2017-06-28 NOTE — Telephone Encounter (Signed)
I spoke with Rachel Wiley, and she states there is no other known lab test that she knows of. Will patient need booster?

## 2017-06-28 NOTE — Telephone Encounter (Signed)
According to the test we did she is immune---- can we ask the lab if there is another test ?

## 2017-06-28 NOTE — Telephone Encounter (Signed)
Spoke with Dr Lyndel Safe He said its a hep C surface antibody titre----- HBsAb titer ---- [id number] Is that a different test than what we did.?

## 2017-07-02 ENCOUNTER — Other Ambulatory Visit: Payer: Self-pay | Admitting: Emergency Medicine

## 2017-07-02 ENCOUNTER — Telehealth: Payer: Self-pay | Admitting: Emergency Medicine

## 2017-07-02 ENCOUNTER — Other Ambulatory Visit (INDEPENDENT_AMBULATORY_CARE_PROVIDER_SITE_OTHER): Payer: BLUE CROSS/BLUE SHIELD

## 2017-07-02 DIAGNOSIS — Z205 Contact with and (suspected) exposure to viral hepatitis: Secondary | ICD-10-CM

## 2017-07-02 NOTE — Addendum Note (Signed)
Addended by: Emi Holes on: 07/02/2017 09:10 AM   Modules accepted: Orders

## 2017-07-02 NOTE — Telephone Encounter (Signed)
Rachel Wiley ordered correct lab and has scheduled patient to come in today at 1:45.

## 2017-07-02 NOTE — Telephone Encounter (Signed)
Hepatitis B Surface Antibody Immunity, Quantitative ordered per previous note from Dr. Etter Sjogren. Test has been ordered, called pt and scheduled lab only visit for today at 1:45.

## 2017-07-02 NOTE — Addendum Note (Signed)
Addended by: Caffie Pinto on: 07/02/2017 01:48 PM   Modules accepted: Orders

## 2017-07-03 LAB — HEPATITIS B SURFACE ANTIBODY, QUANTITATIVE: Hepatitis B-Post: 443 m[IU]/mL (ref >=10)

## 2017-09-18 ENCOUNTER — Ambulatory Visit: Payer: BLUE CROSS/BLUE SHIELD | Admitting: Cardiovascular Disease

## 2017-10-14 NOTE — Progress Notes (Signed)
Patient ID: Rachel Wiley, female   DOB: Apr 06, 1975, 42 y.o.   MRN: [id number]   41 y.o. first seen June 2018 for benign sounding palpitations. Date back as far as 2012 No history of thryoid disease or anemia. No SSCP and mild dyspnea with walking. No stimulants and only 2 cups of tea /day. Has not had SSCP, presyncope, edema. Sometimes headaches have made her pulse faster.  June 2018 Used minoxidil for hair growth 2 weeks Started having labile BP palpitations and chest pain with dizzyness Inderal would help some Just hasn't felt well.  Pain in chest fleeting sharp can be positional  Mild dyspnea In retrospect also appears that zyrtec made her BP and pulse go up  Symptoms better off minoxidil  01/2015 had normal stress echo  More palpitations March  2018 Event monitor benign taking inderal sporadically   Still seems like life is less than satisfying Kids doing ok Did go back to see family in [place] last month Husband traveling less but now studying for [employer] at [employer] so still busy  ROS: Denies fever, malais, weight loss, blurry vision, decreased visual acuity, cough, sputum, SOB, hemoptysis, pleuritic pain, palpitaitons, heartburn, abdominal pain, melena, lower extremity edema, claudication, or rash.  All other systems reviewed and negative  General: BP 134/80   Pulse 80   Ht 5\' 4"  (1.626 m)   Wt 154 lb 8 oz (70.1 kg)   BMI 26.52 kg/m  Affect appropriate Healthy:  appears stated age 55: normal Neck supple with no adenopathy JVP normal no bruits no thyromegaly Lungs clear with no wheezing and good diaphragmatic motion Heart:  S1/S2 no murmur, no rub, gallop or click PMI normal Abdomen: benighn, BS positve, no tenderness, no AAA no bruit.  No HSM or HJR Distal pulses intact with no bruits No edema Neuro non-focal Skin warm and dry No muscular weakness    Current Outpatient Medications  Medication Sig Dispense Refill  . Ferrous Sulfate (IRON SUPPLEMENT PO) Take 1 tablet by mouth  daily.    . fluticasone (FLONASE) 50 MCG/ACT nasal spray PLACE 2 SPRAYS INTO BOTH NOSTRILS DAILY. 16 g 3  . propranolol (INDERAL) 20 MG tablet Take 1 tablet (20 mg total) by mouth daily as needed (anxiety per pt). 30 tablet 5  . ranitidine (ZANTAC) 150 MG capsule Take 150 mg by mouth daily as needed for heartburn.     No current facility-administered medications for this visit.     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

## 2017-10-17 ENCOUNTER — Encounter: Payer: Self-pay | Admitting: Cardiovascular Disease

## 2017-10-17 ENCOUNTER — Ambulatory Visit: Payer: BLUE CROSS/BLUE SHIELD | Admitting: Cardiovascular Disease

## 2017-10-17 VITALS — BP 134/80 | HR 80 | Ht 64.0 in | Wt 154.5 lb

## 2017-10-17 DIAGNOSIS — R002 Palpitations: Secondary | ICD-10-CM

## 2017-10-17 NOTE — Patient Instructions (Addendum)

## 2018-04-11 ENCOUNTER — Encounter: Payer: Self-pay | Admitting: Family Medicine

## 2018-04-11 ENCOUNTER — Other Ambulatory Visit (HOSPITAL_BASED_OUTPATIENT_CLINIC_OR_DEPARTMENT_OTHER): Payer: Self-pay | Admitting: Family Medicine

## 2018-04-11 ENCOUNTER — Other Ambulatory Visit (HOSPITAL_COMMUNITY)
Admission: RE | Admit: 2018-04-11 | Discharge: 2018-04-11 | Disposition: A | Discharge status: Home / Self Care | Payer: BLUE CROSS/BLUE SHIELD | Source: Ambulatory Visit | Attending: Family Medicine | Admitting: Family Medicine

## 2018-04-11 ENCOUNTER — Ambulatory Visit (INDEPENDENT_AMBULATORY_CARE_PROVIDER_SITE_OTHER): Payer: BLUE CROSS/BLUE SHIELD | Admitting: Family Medicine

## 2018-04-11 VITALS — BP 110/64 | HR 64 | Temp 97.9°F | Ht 64.0 in | Wt 157.0 lb

## 2018-04-11 DIAGNOSIS — Z1231 Encounter for screening mammogram for malignant neoplasm of breast: Secondary | ICD-10-CM

## 2018-04-11 DIAGNOSIS — Z0000 Encounter for general adult medical examination without abnormal findings: Secondary | ICD-10-CM

## 2018-04-11 LAB — COMPREHENSIVE METABOLIC PANEL
ALBUMIN: 4.4 g/dL (ref 3.5–5.2)
ALT: 28 U/L (ref 0–35)
AST: 16 U/L (ref 0–37)
Alkaline Phosphatase: 54 U/L (ref 39–117)
BUN: 14 mg/dL (ref 6–23)
CALCIUM: 9.8 mg/dL (ref 8.4–10.5)
CHLORIDE: 105 meq/L (ref 96–112)
CO2: 26 mEq/L (ref 19–32)
CREATININE: 0.89 mg/dL (ref 0.40–1.20)
GFR: 69.45 mL/min (ref >60.00)
Glucose, Bld: 94 mg/dL (ref 70–99)
POTASSIUM: 5.5 meq/L — AB (ref 3.5–5.1)
Sodium: 140 mEq/L (ref 135–145)
Total Bilirubin: 0.4 mg/dL (ref 0.2–1.2)
Total Protein: 7.3 g/dL (ref 6.0–8.3)

## 2018-04-11 LAB — LIPID PANEL
CHOLESTEROL: 167 mg/dL (ref 0–200)
HDL: 45.4 mg/dL (ref >39.00)
LDL Cholesterol: 100 mg/dL — ABNORMAL HIGH (ref 0–99)
NonHDL: 121.51
Total CHOL/HDL Ratio: 4
Triglycerides: 108 mg/dL (ref 0.0–149.0)
VLDL: 21.6 mg/dL (ref 0.0–40.0)

## 2018-04-11 LAB — CBC WITH DIFFERENTIAL/PLATELET
BASOS PCT: 0.4 % (ref 0.0–3.0)
Basophils Absolute: 0 10*3/uL (ref 0.0–0.1)
EOS PCT: 4.5 % (ref 0.0–5.0)
Eosinophils Absolute: 0.4 10*3/uL (ref 0.0–0.7)
HEMATOCRIT: 37.8 % (ref 36.0–46.0)
HEMOGLOBIN: 12.4 g/dL (ref 12.0–15.0)
LYMPHS PCT: 33.6 % (ref 12.0–46.0)
Lymphs Abs: 2.8 10*3/uL (ref 0.7–4.0)
MCHC: 32.9 g/dL (ref 30.0–36.0)
MCV: 81 fl (ref 78.0–100.0)
MONOS PCT: 8.2 % (ref 3.0–12.0)
Monocytes Absolute: 0.7 10*3/uL (ref 0.1–1.0)
Neutro Abs: 4.5 10*3/uL (ref 1.4–7.7)
Neutrophils Relative %: 53.3 % (ref 43.0–77.0)
Platelets: 182 10*3/uL (ref 150.0–400.0)
RBC: 4.67 Mil/uL (ref 3.87–5.11)
RDW: 16.1 % — AB (ref 11.5–15.5)
WBC: 8.4 10*3/uL (ref 4.0–10.5)

## 2018-04-11 LAB — TSH: TSH: 1.25 u[IU]/mL (ref 0.35–4.50)

## 2018-04-11 NOTE — Progress Notes (Signed)
Subjective:     Rachel Wiley is a 43 y.o. female and is here for a comprehensive physical exam. The patient reports no problems.  Social History   Socioeconomic History  . Marital status: Married    Spouse name: Not on file  . Number of children: Not on file  . Years of education: Not on file  . 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 Financial resource strain: Not on file  . Food insecurity:    Worry: Not on file    Inability: Not on file  . Transportation needs:    Medical: Not on file    Non-medical: Not on file  Tobacco Use  . Smoking status: Never Smoker  . Smokeless tobacco: Never Used  Substance and Sexual Activity  . Alcohol use: No  . Drug use: No  . Sexual activity: Yes  Lifestyle  . Physical activity:    Days per week: Not on file    Minutes per session: Not on file  . Stress: Not on file  Relationships  . Social connections:    Talks on phone: Not on file    Gets together: Not on file    Attends religious service: Not on file    Active member of club or organization: Not on file    Attends meetings of clubs or organizations: Not on file    Relationship status: Not on file  . Intimate partner violence:    Fear of current or ex partner: Not on file    Emotionally abused: Not on file    Physically abused: Not on file    Forced sexual activity: Not on file  Other Topics Concern  . Not on file  Social History Narrative   Never Smoked   Alcohol use-no   Drug use-no   Smoking Status:  never   Drug Use:  No   Exercise--- walking            Health Maintenance  Topic Date Due  . PAP SMEAR-Modifier  12/29/2018  . TETANUS/TDAP  06/18/2021  . INFLUENZA VACCINE  Completed  . HIV Screening  Completed    The following portions of the patient's history were reviewed and updated as appropriate: She  has a past medical history of Anxiety, Headache(784.0), Lipoma of unspecified site,  Other acute reactions to stress, Palpitations, SVD (spontaneous vaginal delivery) (06/18/2011), and Tachycardia. She does not have any pertinent problems on file. She  has a past surgical history that includes No past surgeries. Her family history includes Hypertension in her mother and another family member. She  reports that she has never smoked. She has never used smokeless tobacco. She reports that she does not drink alcohol or use drugs. She has a current medication list which includes the following prescription(s): ferrous sulfate, fluticasone, propranolol, and ranitidine. Current Outpatient Medications on File Prior to Visit  Medication Sig Dispense Refill  . Ferrous Sulfate (IRON SUPPLEMENT PO) Take 1 tablet by mouth daily.    . fluticasone (FLONASE) 50 MCG/ACT nasal spray PLACE 2 SPRAYS INTO BOTH NOSTRILS DAILY. 16 g 3  . propranolol (INDERAL) 20 MG tablet Take 1 tablet (20 mg total) by mouth daily as needed (anxiety per pt). 30 tablet 5  . ranitidine (ZANTAC) 150 MG capsule Take 150 mg by mouth daily as needed for heartburn.     No current facility-administered medications on file prior to visit.  She is allergic to cetirizine & related..  Review of Systems Review of Systems  Constitutional: Negative for activity change, appetite change and fatigue.  HENT: Negative for hearing loss, congestion, tinnitus and ear discharge.  dentist q78m Eyes: Negative for visual disturbance (see optho q1y -- vision corrected to 20/20 with glasses).  Respiratory: Negative for cough, chest tightness and shortness of breath.   Cardiovascular: Negative for chest pain, palpitations and leg swelling.  Gastrointestinal: Negative for abdominal pain, diarrhea, constipation and abdominal distention.  Genitourinary: Negative for urgency, frequency, decreased urine volume and difficulty urinating.  Musculoskeletal: Negative for back pain, arthralgias and gait problem.  Skin: Negative for color change, pallor  and rash.  Neurological: Negative for dizziness, light-headedness, numbness and headaches.  Hematological: Negative for adenopathy. Does not bruise/bleed easily.  Psychiatric/Behavioral: Negative for suicidal ideas, confusion, sleep disturbance, self-injury, dysphoric mood, decreased concentration and agitation.       Objective:    BP 110/64 (BP Location: Left Arm, Patient Position: Sitting, Cuff Size: Normal)   Pulse 64   Temp 97.9 F (36.6 C)   Ht 5\' 4"  (1.626 m)   Wt 157 lb (71.2 kg)   SpO2 99%   BMI 26.95 kg/m  General appearance: alert, cooperative, appears stated age and no distress Head: Normocephalic, without obvious abnormality, atraumatic Eyes: conjunctivae/corneas clear. PERRL, EOM's intact. Fundi benign. Ears: normal TM's and external ear canals both ears Nose: Nares normal. Septum midline. Mucosa normal. No drainage or sinus tenderness. Throat: lips, mucosa, and tongue normal; teeth and gums normal Neck: no adenopathy, no carotid bruit, no JVD, supple, symmetrical, trachea midline and thyroid not enlarged, symmetric, no tenderness/mass/nodules Back: symmetric, no curvature. ROM normal. No CVA tenderness. Lungs: clear to auscultation bilaterally Breasts: normal appearance, no masses or tenderness Heart: regular rate and rhythm, S1, S2 normal, no murmur, click, rub or gallop Abdomen: soft, non-tender; bowel sounds normal; no masses,  no organomegaly Pelvic: cervix normal in appearance, external genitalia normal, no adnexal masses or tenderness, no cervical motion tenderness, rectovaginal septum normal, uterus normal size, shape, and consistency, vagina normal without discharge and pap done,  rectal heme neg brown stool  Extremities: extremities normal, atraumatic, no cyanosis or edema Pulses: 2+ and symmetric Skin: Skin color, texture, turgor normal. No rashes or lesions Lymph nodes: Cervical, supraclavicular, and axillary nodes normal. Neurologic: Alert and oriented X  3, normal strength and tone. Normal symmetric reflexes. Normal coordination and gait    Assessment:    Healthy female exam.      Plan:    ghm utd Check labs  See After Visit Summary for Counseling Recommendations    1. Preventative health care See above - TSH - Lipid panel - CBC with Differential/Platelet - Comprehensive metabolic panel - Cytology - PAP( [place])

## 2018-04-11 NOTE — Patient Instructions (Signed)
Preventive Care 40-64 Years, Female Preventive care refers to lifestyle choices and visits with your health care provider that can promote health and wellness. What does preventive care include?   A yearly physical exam. This is also called an annual well check.  Dental exams once or twice a year.  Routine eye exams. Ask your health care provider how often you should have your eyes checked.  Personal lifestyle choices, including: ? Daily care of your teeth and gums. ? Regular physical activity. ? Eating a healthy diet. ? Avoiding tobacco and drug use. ? Limiting alcohol use. ? Practicing safe sex. ? Taking low-dose aspirin daily starting at age 50. ? Taking vitamin and mineral supplements as recommended by your health care provider. What happens during an annual well check? The services and screenings done by your health care provider during your annual well check will depend on your age, overall health, lifestyle risk factors, and family history of disease. Counseling Your health care provider may ask you questions about your:  Alcohol use.  Tobacco use.  Drug use.  Emotional well-being.  Home and relationship well-being.  Sexual activity.  Eating habits.  Work and work environment.  Method of birth control.  Menstrual cycle.  Pregnancy history. Screening You may have the following tests or measurements:  Height, weight, and BMI.  Blood pressure.  Lipid and cholesterol levels. These may be checked every 5 years, or more frequently if you are over 50 years old.  Skin check.  Lung cancer screening. You may have this screening every year starting at age 55 if you have a 30-pack-year history of smoking and currently smoke or have quit within the past 15 years.  Colorectal cancer screening. All adults should have this screening starting at age 50 and continuing until age 75. Your health care provider may recommend screening at age 45. You will have tests every  1-10 years, depending on your results and the type of screening test. People at increased risk should start screening at an earlier age. Screening tests may include: ? Guaiac-based fecal occult blood testing. ? Fecal immunochemical test (FIT). ? Stool DNA test. ? Virtual colonoscopy. ? Sigmoidoscopy. During this test, a flexible tube with a tiny camera (sigmoidoscope) is used to examine your rectum and lower colon. The sigmoidoscope is inserted through your anus into your rectum and lower colon. ? Colonoscopy. During this test, a long, thin, flexible tube with a tiny camera (colonoscope) is used to examine your entire colon and rectum.  Hepatitis C blood test.  Hepatitis B blood test.  Sexually transmitted disease (STD) testing.  Diabetes screening. This is done by checking your blood sugar (glucose) after you have not eaten for a while (fasting). You may have this done every 1-3 years.  Mammogram. This may be done every 1-2 years. Talk to your health care provider about when you should start having regular mammograms. This may depend on whether you have a family history of breast cancer.  BRCA-related cancer screening. This may be done if you have a family history of breast, ovarian, tubal, or peritoneal cancers.  Pelvic exam and Pap test. This may be done every 3 years starting at age 21. Starting at age 30, this may be done every 5 years if you have a Pap test in combination with an HPV test.  Bone density scan. This is done to screen for osteoporosis. You may have this scan if you are at high risk for osteoporosis. Discuss your test results, treatment options,   and if necessary, the need for more tests with your health care provider. Vaccines Your health care provider may recommend certain vaccines, such as:  Influenza vaccine. This is recommended every year.  Tetanus, diphtheria, and acellular pertussis (Tdap, Td) vaccine. You may need a Td booster every 10 years.  Varicella  vaccine. You may need this if you have not been vaccinated.  Zoster vaccine. You may need this after age 38.  Measles, mumps, and rubella (MMR) vaccine. You may need at least one dose of MMR if you were born in 1957 or later. You may also need a second dose.  Pneumococcal 13-valent conjugate (PCV13) vaccine. You may need this if you have certain conditions and were not previously vaccinated.  Pneumococcal polysaccharide (PPSV23) vaccine. You may need one or two doses if you smoke cigarettes or if you have certain conditions.  Meningococcal vaccine. You may need this if you have certain conditions.  Hepatitis A vaccine. You may need this if you have certain conditions or if you travel or work in places where you may be exposed to hepatitis A.  Hepatitis B vaccine. You may need this if you have certain conditions or if you travel or work in places where you may be exposed to hepatitis B.  Haemophilus influenzae type b (Hib) vaccine. You may need this if you have certain conditions. Talk to your health care provider about which screenings and vaccines you need and how often you need them. This information is not intended to replace advice given to you by your health care provider. Make sure you discuss any questions you have with your health care provider. Document Released: 02/25/2015 Document Revised: 03/21/2017 Document Reviewed: 11/30/2014 Elsevier Interactive Patient Education  2019 [employer].

## 2018-04-14 LAB — CYTOLOGY - PAP
DIAGNOSIS: NEGATIVE
HPV (WINDOPATH): NOT DETECTED

## 2018-04-16 ENCOUNTER — Encounter: Payer: Self-pay | Admitting: Family Medicine

## 2018-04-18 ENCOUNTER — Encounter: Payer: Self-pay | Admitting: Family Medicine

## 2018-04-18 ENCOUNTER — Telehealth: Payer: Self-pay | Admitting: *Deleted

## 2018-04-18 NOTE — Telephone Encounter (Signed)
Copied from [place] [phone]. Topic: General - Other >> Apr 18, 2018 11:07 AM Carolyn Stare wrote:  Pt would like a call back about her lab results

## 2018-04-21 NOTE — Telephone Encounter (Signed)
Please make pt a lab app for bmp    Dx hyperkalemia

## 2018-04-21 NOTE — Telephone Encounter (Signed)
It is only slightly elevated---   Sometimes it sits too long and this can happen We can repeat it in the next few months if she likes

## 2018-04-22 NOTE — Telephone Encounter (Signed)
See my chart message

## 2018-04-22 NOTE — Telephone Encounter (Signed)
Any specific time frame?

## 2018-04-23 ENCOUNTER — Other Ambulatory Visit: Payer: Self-pay

## 2018-04-23 DIAGNOSIS — E875 Hyperkalemia: Secondary | ICD-10-CM

## 2018-04-23 NOTE — Telephone Encounter (Signed)
Repeat BMET.  Thanks  Candee Furbish, MD

## 2018-04-23 NOTE — Telephone Encounter (Signed)
Repeat BMET Thanks Candee Furbish, MD

## 2018-04-25 ENCOUNTER — Other Ambulatory Visit: Payer: Self-pay

## 2018-04-25 ENCOUNTER — Other Ambulatory Visit: Payer: BLUE CROSS/BLUE SHIELD | Admitting: *Deleted

## 2018-04-25 DIAGNOSIS — E875 Hyperkalemia: Secondary | ICD-10-CM | POA: Diagnosis not present

## 2018-04-25 LAB — BASIC METABOLIC PANEL
BUN / CREAT RATIO: 15 (ref 9–23)
BUN: 13 mg/dL (ref 6–24)
CALCIUM: 8.7 mg/dL (ref 8.7–10.2)
CO2: 22 mmol/L (ref 20–29)
CREATININE: 0.87 mg/dL (ref 0.57–1.00)
Chloride: 103 mmol/L (ref 96–106)
GFR calc Af Amer: 95 mL/min/{1.73_m2} (ref >59)
GFR calc non Af Amer: 82 mL/min/{1.73_m2} (ref >59)
GLUCOSE: 88 mg/dL (ref 65–99)
Potassium: 4 mmol/L (ref 3.5–5.2)
Sodium: 139 mmol/L (ref 134–144)

## 2018-04-28 ENCOUNTER — Telehealth: Payer: Self-pay

## 2018-04-28 NOTE — Telephone Encounter (Signed)
Notes recorded by Frederik Schmidt, RN on 04/28/2018 at 9:29 AM EDT Lpm that labs are normal and no recommendations at this time. If questions, please call. 3/16 ------

## 2018-04-28 NOTE — Telephone Encounter (Signed)
-----   Message from Jerline Pain, MD sent at 04/28/2018  8:54 AM EDT ----- Reassuring potassium. Normal labs.  Candee Furbish, MD

## 2018-05-11 ENCOUNTER — Other Ambulatory Visit: Payer: Self-pay | Admitting: Family Medicine

## 2018-06-02 ENCOUNTER — Encounter: Payer: Self-pay | Admitting: Family Medicine

## 2018-06-02 NOTE — Telephone Encounter (Signed)
Zantac has been recalled ----- we can change iit to pepcid if she agrees

## 2018-06-02 NOTE — Telephone Encounter (Signed)
Dr Carollee Herter -- please advise medication request above? Rx history doesn't show that you have prescribed before.Marland Kitchen

## 2018-06-09 ENCOUNTER — Encounter: Payer: Self-pay | Admitting: Family Medicine

## 2018-06-09 ENCOUNTER — Other Ambulatory Visit: Payer: Self-pay

## 2018-06-09 ENCOUNTER — Ambulatory Visit (INDEPENDENT_AMBULATORY_CARE_PROVIDER_SITE_OTHER): Payer: BLUE CROSS/BLUE SHIELD | Admitting: Family Medicine

## 2018-06-09 DIAGNOSIS — J40 Bronchitis, not specified as acute or chronic: Secondary | ICD-10-CM | POA: Diagnosis not present

## 2018-06-09 DIAGNOSIS — R1013 Epigastric pain: Secondary | ICD-10-CM | POA: Diagnosis not present

## 2018-06-09 DIAGNOSIS — R0982 Postnasal drip: Secondary | ICD-10-CM

## 2018-06-09 MED ORDER — FAMOTIDINE 20 MG PO TABS: 20.0000 mg | ORAL_TABLET | Freq: Two times a day (BID) | ORAL | 5 refills | Status: DC

## 2018-06-09 MED ORDER — AZITHROMYCIN 250 MG PO TABS: ORAL_TABLET | ORAL | 0 refills | Status: DC

## 2018-06-09 MED ORDER — AZELASTINE HCL 0.1 % NA SOLN: 1.0000 | Freq: Two times a day (BID) | NASAL | 12 refills | Status: DC

## 2018-06-09 NOTE — Progress Notes (Signed)
Virtual Visit via Video Note  I connected with Rachel Wiley on 06/09/18 at 10:00 AM EDT by a video enabled telemedicine application and verified that I am speaking with the correct person using two identifiers.   I discussed the limitations of evaluation and management by telemedicine and the availability of in person appointments. The patient expressed understanding and agreed to proceed.  History of Present Illness: Pt is home c/o runny nose and congestion.    + ear pain and sore throat since last Thursday .  + cough with green mucus.  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 Anxiety   . Headache(784.0)   . Lipoma of unspecified site   . Other acute reactions to stress   . Palpitations   . SVD (spontaneous vaginal delivery) 06/18/2011  . Tachycardia    Outpatient Encounter Medications as of 06/09/2018  Medication Sig  . Ferrous Sulfate (IRON SUPPLEMENT PO) Take 1 tablet by mouth daily.  . fluticasone (FLONASE) 50 MCG/ACT nasal spray SPRAY 2 SPRAYS INTO EACH NOSTRIL EVERY DAY  . propranolol (INDERAL) 20 MG tablet Take 1 tablet (20 mg total) by mouth daily as needed (anxiety per pt).  . ranitidine (ZANTAC) 150 MG capsule Take 150 mg by mouth daily as needed for heartburn.   No facility-administered encounter medications on file as of 06/09/2018.    Observations/Objective: Afebrile ,  113/87,  p 100-108       Pt is in NAD Assessment and Plan: 1. Bronchitis Can use otc cough meds -- delsym, mucinex  - azithromycin (ZITHROMAX Z-PAK) 250 MG tablet; As directed  Dispense: 6 each; Refill: 0  2. PND (post-nasal drip) con't with flonase and add astelin Pt unable to tolerate antihistamines - azelastine (ASTELIN) 0.1 % nasal spray; Place 1 spray into both nostrils 2 (two) times daily. Use in each nostril as directed  Dispense: 30 mL; Refill: 12  3.  Dyspepsia D/c zantac -- recalled  Start prn pepcid  - famotidine (PEPCID) 20 MG tablet; Take 1 tablet (20 mg total) by mouth 2 (two) times daily.  Dispense: 60 tablet; Refill: 5   Follow Up Instructions:    I discussed the assessment and treatment plan with the patient. The patient was provided an opportunity to ask questions and all were answered. The patient agreed with the plan and demonstrated an understanding of the instructions.   The patient was advised to call back or seek an in-person evaluation if the symptoms worsen or if the condition fails to improve as anticipated.  I provided 15 minutes of non-face-to-face time during this encounter.   [name], DO

## 2018-06-10 ENCOUNTER — Telehealth: Payer: Self-pay | Admitting: Family Medicine

## 2018-06-10 NOTE — Telephone Encounter (Signed)
Patient stated wants to know what she can get as a decongestant, she has history of tachycardia and also something for cough.  She is asking for something over the counter.  I believe her weakness is fatigue.

## 2018-06-10 NOTE — Telephone Encounter (Signed)
Flonase, nasacort or rhinocort otc

## 2018-06-10 NOTE — Telephone Encounter (Signed)
Copied from [place] [id number]. Topic: Quick Communication - See Telephone Encounter >> Jun 10, 2018 10:25 AM Valla Leaver wrote: CRM for notification. See Telephone encounter for: 06/10/18. Patient calling to report weakness since starting treatment post evisit with [place] yesterday. Would like advice

## 2018-06-10 NOTE — Telephone Encounter (Signed)
Please advise 

## 2018-06-10 NOTE — Telephone Encounter (Signed)
It can take a few days for the abx to start to work--- if she is having high fever or sob she should go to the ER

## 2018-06-11 NOTE — Telephone Encounter (Signed)
Anything she can take for cough and she has a lot of mucus coming up now.

## 2018-06-11 NOTE — Telephone Encounter (Signed)
mucinex or mucinex dm --- these should not make her heart race

## 2018-06-11 NOTE — Telephone Encounter (Signed)
Patient notified

## 2018-06-19 ENCOUNTER — Other Ambulatory Visit: Payer: Self-pay | Admitting: Cardiovascular Disease

## 2018-11-21 ENCOUNTER — Ambulatory Visit: Payer: BLUE CROSS/BLUE SHIELD

## 2018-11-28 ENCOUNTER — Ambulatory Visit: Payer: Self-pay

## 2018-12-02 NOTE — Progress Notes (Signed)
Patient ID: Rachel Wiley, female   DOB: 09-05-1975, 43 y.o.   MRN: [id number]     42 y.o. first seen June 2018 for benign sounding palpitations. Date back as far as 2012 No history of thryoid disease or anemia. No SSCP and mild dyspnea with walking. No stimulants and only 2 cups of tea /day. Has not had SSCP, presyncope, edema. Sometimes headaches have made her pulse faster.  June 2018 Used minoxidil for hair growth 2 weeks Started having labile BP palpitations and chest pain with dizzyness Inderal would help some Just hasn't felt well.  Pain in chest fleeting sharp can be positional  Mild dyspnea In retrospect also appears that zyrtec made her BP and pulse go up  Symptoms better off minoxidil  01/2015 had normal stress echo  More palpitations March  2018 Event monitor benign taking inderal sporadically   Still seems like life is less than satisfying Kids doing ok Did go back to see family in [place] last month Husband traveling less but now studying for [employer] [facility] [place] so still busy  Having more tachycardia sometimes associated with low BP in am   ROS: Denies fever, malais, weight loss, blurry vision, decreased visual acuity, cough, sputum, SOB, hemoptysis, pleuritic pain, palpitaitons, heartburn, abdominal pain, melena, lower extremity edema, claudication, or rash.  All other systems reviewed and negative  General: BP 128/82   Pulse 80   Ht 5\' 4"  (1.626 m)   Wt 148 lb (67.1 kg)   SpO2 100%   BMI 25.40 kg/m  Affect appropriate Healthy:  appears stated age 63: normal Neck supple with no adenopathy JVP normal no bruits no thyromegaly Lungs clear with no wheezing and good diaphragmatic motion Heart:  S1/S2 no murmur, no rub, gallop or click PMI normal Abdomen: benighn, BS positve, no tenderness, no AAA no bruit.  No HSM or HJR Distal pulses intact with no bruits No edema Neuro non-focal Skin warm and dry No muscular weakness    Current Outpatient Medications  Medication Sig  Dispense Refill  . azelastine (ASTELIN) 0.1 % nasal spray Place 1 spray into both nostrils 2 (two) times daily. Use in each nostril as directed 30 mL 12  . famotidine (PEPCID) 20 MG tablet Take 1 tablet (20 mg total) by mouth 2 (two) times daily. 60 tablet 5  . Ferrous Sulfate (IRON SUPPLEMENT PO) Take 1 tablet by mouth daily.    . fluticasone (FLONASE) 50 MCG/ACT nasal spray SPRAY 2 SPRAYS INTO EACH NOSTRIL EVERY DAY 16 g 5  . propranolol (INDERAL) 20 MG tablet Take 1 tablet (20 mg total) by mouth daily. 90 tablet 3   No current facility-administered medications for this visit.     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

## 2018-12-05 ENCOUNTER — Ambulatory Visit (INDEPENDENT_AMBULATORY_CARE_PROVIDER_SITE_OTHER): Payer: Self-pay | Admitting: Cardiovascular Disease

## 2018-12-05 ENCOUNTER — Ambulatory Visit: Payer: BLUE CROSS/BLUE SHIELD | Admitting: Cardiovascular Disease

## 2018-12-05 ENCOUNTER — Other Ambulatory Visit: Payer: Self-pay

## 2018-12-05 ENCOUNTER — Encounter: Payer: Self-pay | Admitting: Cardiovascular Disease

## 2018-12-05 VITALS — BP 128/82 | HR 80 | Ht 64.0 in | Wt 148.0 lb

## 2018-12-05 DIAGNOSIS — Z23 Encounter for immunization: Secondary | ICD-10-CM

## 2018-12-05 DIAGNOSIS — R002 Palpitations: Secondary | ICD-10-CM

## 2018-12-05 MED ORDER — PROPRANOLOL HCL 20 MG PO TABS: 20.0000 mg | ORAL_TABLET | Freq: Every day | ORAL | 3 refills | Status: DC

## 2018-12-05 NOTE — Patient Instructions (Signed)
Medication Instructions:  Your physician has recommended you make the following change in your medication:  1-Take Inderal 20 mg by mouth daily  *If you need a refill on your cardiac medications before your next appointment, please call your pharmacy*  Lab Work:  If you have labs (blood work) drawn today and your tests are completely normal, you will receive your results only by: Marland Kitchen MyChart Message (if you have MyChart) OR . A paper copy in the mail If you have any lab test that is abnormal or we need to change your treatment, we will call you to review the results.  Testing/Procedures: Your physician has recommended that you wear an zio patch monitor. Event monitors are medical devices that record the heart's electrical activity. Doctors most often [place] these monitors to diagnose arrhythmias. Arrhythmias are problems with the speed or rhythm of the heartbeat. The monitor is a small, portable device. You can wear one while you do your normal daily activities. This is usually used to diagnose what is causing palpitations/syncope (passing out).  Follow-Up: At [facility], you and your health needs are our priority.  As part of our continuing mission to provide you with exceptional heart care, we have created designated Provider Care Teams.  These Care Teams include your primary Cardiologist (physician) and Advanced Practice Providers (APPs -  Physician Assistants and Nurse Practitioners) who all work together to provide you with the care you need, when you need it.  Your next appointment:   1 month  The format for your next appointment:   In Person  Provider:    You may see Truitt Merle NP or Cecilie Kicks NP

## 2018-12-26 ENCOUNTER — Ambulatory Visit (INDEPENDENT_AMBULATORY_CARE_PROVIDER_SITE_OTHER): Payer: BC Managed Care – PPO

## 2018-12-26 DIAGNOSIS — R002 Palpitations: Secondary | ICD-10-CM | POA: Diagnosis not present

## 2019-01-05 ENCOUNTER — Ambulatory Visit: Payer: Self-pay | Admitting: Nurse Practitioner

## 2019-01-13 ENCOUNTER — Ambulatory Visit: Payer: Self-pay | Admitting: Nurse Practitioner

## 2019-01-28 NOTE — Progress Notes (Signed)
CARDIOLOGY OFFICE NOTE  Date:  02/03/2019    Rachel Wiley Date of Birth: 19-Nov-1975 Medical Record [id number]  PCP:  [name], DO  Cardiologist:  Johnsie Cancel  Chief Complaint  Patient presents with  . Follow-up    History of Present Illness: Rachel Wiley is a 43 y.o. female who presents today for a 2 month check. Seen for Dr. Johnsie Cancel.   Seen originally back in 2018 for palpitations that date back to as far as 2012. Using Minoxidil back in June of 2018 with worsening palpitations and labile BP. Symptoms improved off Minoxidil.   Last seen in October - seemed to have more stress, less satisfied with life. More tachycardia associated with low BP.   The patient does not have symptoms concerning for COVID-19 infection (fever, chills, cough, or new shortness of breath).   Comes in today. Here alone. She is feeling better. She looked at her monitor thru My Chart - we reviewed as well. She is trying to get back to "me time". Hard with children and soon to have a new dog but seems to understand the need for self care. Less palpitations. She sometimes misses the evening dose of Inderal. Bp still a little soft.    Past Medical History:  Diagnosis Date  . Anxiety   . Headache(784.0)   . Lipoma of unspecified site   . Other acute reactions to stress   . Palpitations   . SVD (spontaneous vaginal delivery) 06/18/2011  . Tachycardia     Past Surgical History:  Procedure Laterality Date  . NO PAST SURGERIES       Medications: Current Meds  Medication Sig  . azelastine (ASTELIN) 0.1 % nasal spray Place 1 spray into both nostrils 2 (two) times daily. Use in each nostril as directed  . famotidine (PEPCID) 20 MG tablet Take 20 mg by mouth 2 (two) times daily as needed for heartburn or indigestion.  . fluticasone (FLONASE) 50 MCG/ACT nasal spray Place 2 sprays into both nostrils as needed for allergies or rhinitis.  Marland Kitchen propranolol (INDERAL) 20 MG tablet Take 10 mg by mouth 2  (two) times daily.     Allergies: Allergies  Allergen Reactions  . Cetirizine & Related Palpitations    Per pt    Social History: The patient  reports that she has never smoked. She has never used smokeless tobacco. She reports that she does not drink alcohol or use drugs.   Family History: The patient's family history includes Hypertension in her mother and another family member.   Review of Systems: Please see the history of present illness.   All other systems are reviewed and negative.   Physical Exam: VS:  BP 108/70   Pulse 84   Ht 5\' 4"  (1.626 m)   Wt 155 lb (70.3 kg)   SpO2 99%   BMI 26.61 kg/m  .  BMI Body mass index is 26.61 kg/m.  Wt Readings from Last 3 Encounters:  02/03/19 155 lb (70.3 kg)  12/05/18 148 lb (67.1 kg)  04/11/18 157 lb (71.2 kg)    General: Pleasant. Well developed, well nourished and in no acute distress.   HEENT: Normal.  Neck: Supple, no JVD, carotid bruits, or masses noted.  Cardiac: Regular rate and rhythm. No murmurs, rubs, or gallops. No edema.  Respiratory:  Lungs are clear to auscultation bilaterally with normal work of breathing.  GI: Soft and nontender.  MS: No deformity or atrophy. Gait and ROM intact.  Skin: Warm and dry. Color is normal.  Neuro:  Strength and sensation are intact and no gross focal deficits noted.  Psych: Alert, appropriate and with normal affect.   LABORATORY DATA:  EKG:  EKG is not ordered today.  Lab Results  Component Value Date   WBC 8.4 04/11/2018   HGB 12.4 04/11/2018   HCT 37.8 04/11/2018   PLT 182.0 04/11/2018   GLUCOSE 88 04/25/2018   CHOL 167 04/11/2018   TRIG 108.0 04/11/2018   HDL 45.40 04/11/2018   LDLCALC 100 (H) 04/11/2018   ALT 28 04/11/2018   AST 16 04/11/2018   NA 139 04/25/2018   K 4.0 04/25/2018   CL 103 04/25/2018   CREATININE 0.87 04/25/2018   BUN 13 04/25/2018   CO2 22 04/25/2018   TSH 1.25 04/11/2018   HGBA1C 5.3 03/03/2015     BNP (last 3 results) No results  for input(s): BNP in the last 8760 hours.  ProBNP (last 3 results) No results for input(s): PROBNP in the last 8760 hours.   Other Studies Reviewed Today:  Monitor Study Highlights 12/2018  NSR Average HR 84 bpm Rare PAC/PVC;s < 1% beats No significant arrhythmias     Assessment/Plan:  1. Palpitations - stable monitor - would continue to use beta blocker but suspect getting back to self care measures will help her. To offset her low BP - would liberalize her salt intake. She is not really using caffeine. No changes made today.   2. Atypical chest pain - normal stress echo back in 2016 - resolved.   3. Situational stress/anxiety -  Stressed the need for "me time" and self care.   4. GERD  . COVID-19 Education: The signs and symptoms of COVID-19 were discussed with the patient and how to seek care for testing (follow up with PCP or arrange E-visit).  The importance of social distancing, staying at home, hand hygiene and wearing a mask when out in public were discussed today.  Current medicines are reviewed with the patient today.  The patient does not have concerns regarding medicines other than what has been noted above.  The following changes have been made:  See above.  Labs/ tests ordered today include:   No orders of the defined types were placed in this encounter.    Disposition:   FU with [place] in a year.   Patient is agreeable to this plan and will call if any problems develop in the interim.   SignedTruitt Merle, NP  02/03/2019 4:01 PM  [facility] [place] [address] [place], [place]  [address] Phone: [phone] Fax: [phone]

## 2019-02-03 ENCOUNTER — Encounter

## 2019-02-03 ENCOUNTER — Other Ambulatory Visit: Payer: Self-pay

## 2019-02-03 ENCOUNTER — Ambulatory Visit (INDEPENDENT_AMBULATORY_CARE_PROVIDER_SITE_OTHER): Payer: BC Managed Care – PPO | Admitting: Nurse Practitioner

## 2019-02-03 ENCOUNTER — Encounter: Payer: Self-pay | Admitting: Nurse Practitioner

## 2019-02-03 VITALS — BP 108/70 | HR 84 | Ht 64.0 in | Wt 155.0 lb

## 2019-02-03 DIAGNOSIS — Z7189 Other specified counseling: Secondary | ICD-10-CM | POA: Diagnosis not present

## 2019-02-03 DIAGNOSIS — R002 Palpitations: Secondary | ICD-10-CM

## 2019-02-03 NOTE — Patient Instructions (Addendum)
After Visit Summary:  We will be checking the following labs today - NONE   Medication Instructions:    Continue with your current medicines.    If you need a refill on your cardiac medications before your next appointment, please call your pharmacy.     Testing/Procedures To Be Arranged:  N/A  Follow-Up:   See Dr. Johnsie Cancel in one year - You will receive a reminder letter in the mail two months in advance. If you don't receive a letter, please call our office to schedule the follow-up appointment.      At [facility], you and your health needs are our priority.  As part of our continuing mission to provide you with exceptional heart care, we have created designated Provider Care Teams.  These Care Teams include your primary Cardiologist (physician) and Advanced Practice Providers (APPs -  Physician Assistants and Nurse Practitioners) who all work together to provide you with the care you need, when you need it.  Special Instructions:  . Stay safe, stay home, wash your hands for at least 20 seconds and wear a mask when out in public.  . It was good to talk with you today.  . Think about what we talked about today.    Call the [place] office at [phone] if you have any questions, problems or concerns.

## 2019-04-17 ENCOUNTER — Other Ambulatory Visit: Payer: Self-pay

## 2019-04-17 ENCOUNTER — Ambulatory Visit (INDEPENDENT_AMBULATORY_CARE_PROVIDER_SITE_OTHER): Payer: BC Managed Care – PPO | Admitting: Family Medicine

## 2019-04-17 ENCOUNTER — Encounter: Payer: Self-pay | Admitting: Family Medicine

## 2019-04-17 VITALS — BP 110/80 | HR 61 | Temp 97.1°F | Resp 18 | Ht 64.0 in | Wt 152.4 lb

## 2019-04-17 DIAGNOSIS — Z0000 Encounter for general adult medical examination without abnormal findings: Secondary | ICD-10-CM | POA: Diagnosis not present

## 2019-04-17 DIAGNOSIS — R002 Palpitations: Secondary | ICD-10-CM | POA: Diagnosis not present

## 2019-04-17 DIAGNOSIS — K644 Residual hemorrhoidal skin tags: Secondary | ICD-10-CM | POA: Diagnosis not present

## 2019-04-17 DIAGNOSIS — R0982 Postnasal drip: Secondary | ICD-10-CM | POA: Diagnosis not present

## 2019-04-17 DIAGNOSIS — L853 Xerosis cutis: Secondary | ICD-10-CM

## 2019-04-17 LAB — COMPREHENSIVE METABOLIC PANEL
ALT: 14 U/L (ref 0–35)
AST: 15 U/L (ref 0–37)
Albumin: 4 g/dL (ref 3.5–5.2)
Alkaline Phosphatase: 51 U/L (ref 39–117)
BUN: 14 mg/dL (ref 6–23)
CO2: 27 mEq/L (ref 19–32)
Calcium: 9.2 mg/dL (ref 8.4–10.5)
Chloride: 103 mEq/L (ref 96–112)
Creatinine, Ser: 0.86 mg/dL (ref 0.40–1.20)
GFR: 71.9 mL/min (ref >60.00)
Glucose, Bld: 93 mg/dL (ref 70–99)
Potassium: 4.6 mEq/L (ref 3.5–5.1)
Sodium: 137 mEq/L (ref 135–145)
Total Bilirubin: 0.4 mg/dL (ref 0.2–1.2)
Total Protein: 7.1 g/dL (ref 6.0–8.3)

## 2019-04-17 LAB — CBC WITH DIFFERENTIAL/PLATELET
Basophils Absolute: 0 10*3/uL (ref 0.0–0.1)
Basophils Relative: 0.6 % (ref 0.0–3.0)
Eosinophils Absolute: 0.4 10*3/uL (ref 0.0–0.7)
Eosinophils Relative: 7 % — ABNORMAL HIGH (ref 0.0–5.0)
HCT: 31.6 % — ABNORMAL LOW (ref 36.0–46.0)
Hemoglobin: 9.9 g/dL — ABNORMAL LOW (ref 12.0–15.0)
Lymphocytes Relative: 37.2 % (ref 12.0–46.0)
Lymphs Abs: 1.9 10*3/uL (ref 0.7–4.0)
MCHC: 31.4 g/dL (ref 30.0–36.0)
MCV: 73.3 fl — ABNORMAL LOW (ref 78.0–100.0)
Monocytes Absolute: 0.4 10*3/uL (ref 0.1–1.0)
Monocytes Relative: 8.8 % (ref 3.0–12.0)
Neutro Abs: 2.4 10*3/uL (ref 1.4–7.7)
Neutrophils Relative %: 46.4 % (ref 43.0–77.0)
Platelets: 234 10*3/uL (ref 150.0–400.0)
RBC: 4.32 Mil/uL (ref 3.87–5.11)
RDW: 17.1 % — ABNORMAL HIGH (ref 11.5–15.5)
WBC: 5.1 10*3/uL (ref 4.0–10.5)

## 2019-04-17 LAB — LIPID PANEL
Cholesterol: 163 mg/dL (ref 0–200)
HDL: 39.2 mg/dL (ref >39.00)
LDL Cholesterol: 105 mg/dL — ABNORMAL HIGH (ref 0–99)
NonHDL: 123.95
Total CHOL/HDL Ratio: 4
Triglycerides: 94 mg/dL (ref 0.0–149.0)
VLDL: 18.8 mg/dL (ref 0.0–40.0)

## 2019-04-17 LAB — TSH: TSH: 1.09 u[IU]/mL (ref 0.35–4.50)

## 2019-04-17 MED ORDER — AZELASTINE HCL 0.1 % NA SOLN
1.0000 | Freq: Two times a day (BID) | NASAL | 12 refills | Status: DC
Start: 2019-04-17 — End: 2020-04-15

## 2019-04-17 MED ORDER — FLUTICASONE PROPIONATE 50 MCG/ACT NA SUSP: 2.0000 | NASAL | 5 refills | Status: DC | PRN

## 2019-04-17 MED ORDER — HYDROCORTISONE ACETATE 25 MG RE SUPP: 25.0000 mg | Freq: Two times a day (BID) | RECTAL | 0 refills | Status: DC

## 2019-04-17 MED ORDER — PRAMOXINE HCL (PERIANAL) 1 % EX FOAM
1.0000 | Freq: Three times a day (TID) | CUTANEOUS | 0 refills | Status: DC | PRN
Start: 2019-04-17 — End: 2020-04-15

## 2019-04-17 MED ORDER — LAC-HYDRIN FIVE 5 % EX LOTN: 1.0000 "application " | TOPICAL_LOTION | Freq: Two times a day (BID) | CUTANEOUS | 2 refills | Status: DC

## 2019-04-17 NOTE — Patient Instructions (Signed)

## 2019-04-17 NOTE — Progress Notes (Signed)
Subjective:     Rachel Wiley is a 44 y.o. female and is here for a comprehensive physical exam. The patient reports no problems.  Social History   Socioeconomic History  . Marital status: Married    Spouse name: Not on file  . Number of children: Not on file  . Years of education: Not on file  . 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 Smoking status: Never Smoker  . Smokeless tobacco: Never Used  Substance and Sexual Activity  . Alcohol use: No  . Drug use: No  . Sexual activity: Yes  Other Topics Concern  . Not on file  Social History Narrative   Never Smoked   Alcohol use-no   Drug use-no   Smoking Status:  never   Drug Use:  No   Exercise--- walking            Social Determinants of Health   Financial Resource Strain:   . Difficulty of Paying Living Expenses: Not on file  Food Insecurity:   . Worried About Charity fundraiser in the Last Year: Not on file  . Ran Out of Food in the Last Year: Not on file  Transportation Needs:   . Lack of Transportation (Medical): Not on file  . Lack of Transportation (Non-Medical): Not on file  Physical Activity:   . Days of Exercise per Week: Not on file  . Minutes of Exercise per Session: Not on file  Stress:   . Feeling of Stress : Not on file  Social Connections:   . Frequency of Communication with Friends and Family: Not on file  . Frequency of Social Gatherings with Friends and Family: Not on file  . Attends Religious Services: Not on file  . Active Member of Clubs or Organizations: Not on file  . Attends Archivist Meetings: Not on file  . Marital Status: Not on file  Intimate Partner Violence:   . Fear of Current or Ex-Partner: Not on file  . Emotionally Abused: Not on file  . Physically Abused: Not on file  . Sexually Abused: Not on file   Health Maintenance  Topic Date Due  . PAP SMEAR-Modifier  04/11/2021  . TETANUS/TDAP   06/18/2021  . INFLUENZA VACCINE  Completed  . HIV Screening  Completed    The following portions of the patient's history were reviewed and updated as appropriate:  She  has a past medical history of Anxiety, Headache(784.0), Lipoma of unspecified site, Other acute reactions to stress, Palpitations, SVD (spontaneous vaginal delivery) (06/18/2011), and Tachycardia. She does not have any pertinent problems on file. She  has a past surgical history that includes No past surgeries. Her family history includes Hypertension in her mother and another family member. She  reports that she has never smoked. She has never used smokeless tobacco. She reports that she does not drink alcohol or use drugs. She has a current medication list which includes the following prescription(s): azelastine, famotidine, fluticasone, propranolol, lac-hydrin five, hydrocortisone, and pramoxine. Current Outpatient Medications on File Prior to Visit  Medication Sig Dispense Refill  . famotidine (PEPCID) 20 MG tablet Take 20 mg by mouth 2 (two) times daily as needed for heartburn or indigestion.    . propranolol (INDERAL) 20 MG tablet Take 10 mg by mouth 2 (two) times daily.     No current facility-administered medications on file prior to visit.  She is allergic to cetirizine & related..  Review of Systems Review of Systems  Constitutional: Negative for activity change, appetite change and fatigue.  HENT: Negative for hearing loss, congestion, tinnitus and ear discharge.  dentist q33m Eyes: Negative for visual disturbance (see optho q1y -- vision corrected to 20/20 with glasses).  Respiratory: Negative for cough, chest tightness and shortness of breath.   Cardiovascular: Negative for chest pain, palpitations and leg swelling.  Gastrointestinal: Negative for abdominal pain, diarrhea, constipation and abdominal distention.  Genitourinary: Negative for urgency, frequency, decreased urine volume and difficulty urinating.   Musculoskeletal: Negative for back pain, arthralgias and gait problem.  Skin: Negative for color change, pallor and rash.  Neurological: Negative for dizziness, light-headedness, numbness and headaches.  Hematological: Negative for adenopathy. Does not bruise/bleed easily.  Psychiatric/Behavioral: Negative for suicidal ideas, confusion, sleep disturbance, self-injury, dysphoric mood, decreased concentration and agitation.       Objective:    BP 110/80 (BP Location: Left Arm, Patient Position: Standing, Cuff Size: Normal)   Pulse 61   Temp (!) 97.1 F (36.2 C) (Temporal)   Resp 18   Ht 5\' 4"  (1.626 m)   Wt 152 lb 6.4 oz (69.1 kg)   LMP 04/11/2019   SpO2 100%   BMI 26.16 kg/m  General appearance: alert, cooperative, appears stated age and no distress Head: Normocephalic, without obvious abnormality, atraumatic Eyes: negative findings: lids and lashes normal, conjunctivae and sclerae normal and pupils equal, round, reactive to light and accomodation Ears: normal TM's and external ear canals both ears Nose: Nares normal. Septum midline. Mucosa normal. No drainage or sinus tenderness. Throat: lips, mucosa, and tongue normal; teeth and gums normal Neck: no adenopathy, no carotid bruit, no JVD, supple, symmetrical, trachea midline and thyroid not enlarged, symmetric, no tenderness/mass/nodules Back: symmetric, no curvature. ROM normal. No CVA tenderness. Lungs: clear to auscultation bilaterally Breasts: normal appearance, no masses or tenderness Heart: regular rate and rhythm, S1, S2 normal, no murmur, click, rub or gallop Abdomen: soft, non-tender; bowel sounds normal; no masses,  no organomegaly Pelvic: deferred Extremities: extremities normal, atraumatic, no cyanosis or edema Pulses: 2+ and symmetric Skin: Skin color, texture, turgor normal. No rashes or lesions Lymph nodes: Cervical, supraclavicular, and axillary nodes normal. Neurologic: Alert and oriented X 3, normal strength  and tone. Normal symmetric reflexes. Normal coordination and gait    Assessment:    Healthy female exam.      Plan:    ghm utd Check labs  See After Visit Summar      y for Counseling Recommendations

## 2019-04-17 NOTE — Assessment & Plan Note (Signed)
Per cardiology On inderal

## 2019-04-20 ENCOUNTER — Other Ambulatory Visit: Payer: Self-pay | Admitting: Family Medicine

## 2019-04-20 ENCOUNTER — Telehealth: Payer: Self-pay | Admitting: *Deleted

## 2019-04-20 DIAGNOSIS — D509 Iron deficiency anemia, unspecified: Secondary | ICD-10-CM

## 2019-04-20 MED ORDER — AMMONIUM LACTATE 12 % EX LOTN: 1.0000 "application " | TOPICAL_LOTION | Freq: Two times a day (BID) | CUTANEOUS | 2 refills | Status: DC

## 2019-04-20 NOTE — Telephone Encounter (Signed)
rx sent in 

## 2019-04-20 NOTE — Telephone Encounter (Signed)
yes

## 2019-04-20 NOTE — Telephone Encounter (Addendum)
[facility] [address] fax over stating they are unable to order lac-hydrin 5%.  They do have 12%.  Would the 12% be ok?

## 2019-04-22 ENCOUNTER — Encounter: Payer: Self-pay | Admitting: Family Medicine

## 2019-05-26 ENCOUNTER — Other Ambulatory Visit: Payer: Self-pay

## 2019-05-26 ENCOUNTER — Other Ambulatory Visit (INDEPENDENT_AMBULATORY_CARE_PROVIDER_SITE_OTHER): Payer: BC Managed Care – PPO

## 2019-05-26 DIAGNOSIS — D509 Iron deficiency anemia, unspecified: Secondary | ICD-10-CM | POA: Diagnosis not present

## 2019-05-26 LAB — CBC WITH DIFFERENTIAL/PLATELET
Basophils Absolute: 0 10*3/uL (ref 0.0–0.1)
Basophils Relative: 0.4 % (ref 0.0–3.0)
Eosinophils Absolute: 0.4 10*3/uL (ref 0.0–0.7)
Eosinophils Relative: 4.6 % (ref 0.0–5.0)
HCT: 31.5 % — ABNORMAL LOW (ref 36.0–46.0)
Hemoglobin: 10 g/dL — ABNORMAL LOW (ref 12.0–15.0)
Lymphocytes Relative: 30.6 % (ref 12.0–46.0)
Lymphs Abs: 2.7 10*3/uL (ref 0.7–4.0)
MCHC: 31.8 g/dL (ref 30.0–36.0)
MCV: 72.3 fl — ABNORMAL LOW (ref 78.0–100.0)
Monocytes Absolute: 0.7 10*3/uL (ref 0.1–1.0)
Monocytes Relative: 7.8 % (ref 3.0–12.0)
Neutro Abs: 5 10*3/uL (ref 1.4–7.7)
Neutrophils Relative %: 56.6 % (ref 43.0–77.0)
Platelets: 222 10*3/uL (ref 150.0–400.0)
RBC: 4.35 Mil/uL (ref 3.87–5.11)
RDW: 18.4 % — ABNORMAL HIGH (ref 11.5–15.5)
WBC: 8.8 10*3/uL (ref 4.0–10.5)

## 2019-05-26 LAB — IBC + FERRITIN
Ferritin: 3.5 ng/mL — ABNORMAL LOW (ref 10.0–291.0)
Iron: 29 ug/dL — ABNORMAL LOW (ref 42–145)
Saturation Ratios: 4.9 % — ABNORMAL LOW (ref 20.0–50.0)
Transferrin: 423 mg/dL — ABNORMAL HIGH (ref 212.0–360.0)

## 2019-05-26 LAB — VITAMIN B12: Vitamin B-12: 341 pg/mL (ref 211–911)

## 2019-05-27 ENCOUNTER — Encounter: Payer: Self-pay | Admitting: Family Medicine

## 2019-05-27 ENCOUNTER — Telehealth: Payer: Self-pay | Admitting: *Deleted

## 2019-05-27 ENCOUNTER — Other Ambulatory Visit: Payer: Self-pay | Admitting: Family Medicine

## 2019-05-27 ENCOUNTER — Other Ambulatory Visit (INDEPENDENT_AMBULATORY_CARE_PROVIDER_SITE_OTHER): Payer: BC Managed Care – PPO

## 2019-05-27 DIAGNOSIS — D649 Anemia, unspecified: Secondary | ICD-10-CM | POA: Diagnosis not present

## 2019-05-27 DIAGNOSIS — D509 Iron deficiency anemia, unspecified: Secondary | ICD-10-CM

## 2019-05-27 LAB — FECAL OCCULT BLOOD, IMMUNOCHEMICAL: Fecal Occult Bld: NEGATIVE

## 2019-05-27 MED ORDER — IRON 325 (65 FE) MG PO TABS: 1.0000 | ORAL_TABLET | Freq: Every day | ORAL | 2 refills | Status: DC

## 2019-05-27 NOTE — Telephone Encounter (Signed)
Received call from [facility] lab stating IFOB order is needed. Order placed per 04/17/19 lab result note.

## 2019-06-05 ENCOUNTER — Other Ambulatory Visit: Payer: Self-pay | Admitting: Cardiovascular Disease

## 2019-06-17 ENCOUNTER — Other Ambulatory Visit (HOSPITAL_BASED_OUTPATIENT_CLINIC_OR_DEPARTMENT_OTHER): Payer: Self-pay | Admitting: Family Medicine

## 2019-06-17 DIAGNOSIS — Z1231 Encounter for screening mammogram for malignant neoplasm of breast: Secondary | ICD-10-CM

## 2019-06-26 ENCOUNTER — Other Ambulatory Visit: Payer: Self-pay

## 2019-06-26 ENCOUNTER — Other Ambulatory Visit (INDEPENDENT_AMBULATORY_CARE_PROVIDER_SITE_OTHER): Payer: BC Managed Care – PPO

## 2019-06-26 ENCOUNTER — Ambulatory Visit (HOSPITAL_BASED_OUTPATIENT_CLINIC_OR_DEPARTMENT_OTHER)
Admission: RE | Admit: 2019-06-26 | Discharge: 2019-06-26 | Disposition: A | Discharge status: Home / Self Care | Payer: BC Managed Care – PPO | Source: Ambulatory Visit | Attending: Family Medicine | Admitting: Family Medicine

## 2019-06-26 DIAGNOSIS — D509 Iron deficiency anemia, unspecified: Secondary | ICD-10-CM | POA: Diagnosis not present

## 2019-06-26 DIAGNOSIS — Z1231 Encounter for screening mammogram for malignant neoplasm of breast: Secondary | ICD-10-CM | POA: Diagnosis not present

## 2019-06-26 LAB — CBC WITH DIFFERENTIAL/PLATELET
Basophils Absolute: 0 10*3/uL (ref 0.0–0.1)
Basophils Relative: 0.6 % (ref 0.0–3.0)
Eosinophils Absolute: 0.4 10*3/uL (ref 0.0–0.7)
Eosinophils Relative: 6.4 % — ABNORMAL HIGH (ref 0.0–5.0)
HCT: 32.9 % — ABNORMAL LOW (ref 36.0–46.0)
Hemoglobin: 10.5 g/dL — ABNORMAL LOW (ref 12.0–15.0)
Lymphocytes Relative: 30.3 % (ref 12.0–46.0)
Lymphs Abs: 2.1 10*3/uL (ref 0.7–4.0)
MCHC: 31.8 g/dL (ref 30.0–36.0)
MCV: 74.4 fl — ABNORMAL LOW (ref 78.0–100.0)
Monocytes Absolute: 0.6 10*3/uL (ref 0.1–1.0)
Monocytes Relative: 8.5 % (ref 3.0–12.0)
Neutro Abs: 3.7 10*3/uL (ref 1.4–7.7)
Neutrophils Relative %: 54.2 % (ref 43.0–77.0)
Platelets: 169 10*3/uL (ref 150.0–400.0)
RBC: 4.43 Mil/uL (ref 3.87–5.11)
RDW: 20.3 % — ABNORMAL HIGH (ref 11.5–15.5)
WBC: 6.8 10*3/uL (ref 4.0–10.5)

## 2019-06-26 LAB — IBC + FERRITIN
Ferritin: 7.4 ng/mL — ABNORMAL LOW (ref 10.0–291.0)
Iron: 79 ug/dL (ref 42–145)
Saturation Ratios: 15.4 % — ABNORMAL LOW (ref 20.0–50.0)
Transferrin: 367 mg/dL — ABNORMAL HIGH (ref 212.0–360.0)

## 2019-06-28 ENCOUNTER — Other Ambulatory Visit: Payer: Self-pay | Admitting: Family Medicine

## 2019-06-28 ENCOUNTER — Encounter: Payer: Self-pay | Admitting: Family Medicine

## 2019-06-28 DIAGNOSIS — D508 Other iron deficiency anemias: Secondary | ICD-10-CM

## 2019-06-29 ENCOUNTER — Other Ambulatory Visit: Payer: Self-pay | Admitting: Family Medicine

## 2019-06-29 DIAGNOSIS — D509 Iron deficiency anemia, unspecified: Secondary | ICD-10-CM

## 2019-06-29 DIAGNOSIS — D508 Other iron deficiency anemias: Secondary | ICD-10-CM

## 2019-06-29 DIAGNOSIS — D649 Anemia, unspecified: Secondary | ICD-10-CM

## 2019-06-29 NOTE — Telephone Encounter (Signed)
It is better but still low---- normally I would tell her to take 2 a day but if 1 a day is causing constipation  2 will make it worse  We can refer to hematology if needed

## 2019-06-30 ENCOUNTER — Other Ambulatory Visit: Payer: Self-pay | Admitting: Family Medicine

## 2019-06-30 ENCOUNTER — Other Ambulatory Visit: Payer: Self-pay | Admitting: *Deleted

## 2019-06-30 DIAGNOSIS — R1013 Epigastric pain: Secondary | ICD-10-CM

## 2019-06-30 MED ORDER — PROPRANOLOL HCL 20 MG PO TABS: 10.0000 mg | ORAL_TABLET | Freq: Two times a day (BID) | ORAL | 1 refills | Status: DC

## 2019-07-17 DIAGNOSIS — R002 Palpitations: Secondary | ICD-10-CM | POA: Diagnosis not present

## 2019-07-30 ENCOUNTER — Encounter: Payer: Self-pay | Admitting: Family Medicine

## 2019-07-30 ENCOUNTER — Other Ambulatory Visit: Payer: Self-pay

## 2019-07-30 ENCOUNTER — Other Ambulatory Visit (INDEPENDENT_AMBULATORY_CARE_PROVIDER_SITE_OTHER): Payer: BC Managed Care – PPO

## 2019-07-30 DIAGNOSIS — D649 Anemia, unspecified: Secondary | ICD-10-CM

## 2019-07-30 DIAGNOSIS — D508 Other iron deficiency anemias: Secondary | ICD-10-CM | POA: Diagnosis not present

## 2019-07-30 DIAGNOSIS — D509 Iron deficiency anemia, unspecified: Secondary | ICD-10-CM

## 2019-07-30 LAB — CBC WITH DIFFERENTIAL/PLATELET
Basophils Absolute: 0 10*3/uL (ref 0.0–0.1)
Basophils Relative: 0.5 % (ref 0.0–3.0)
Eosinophils Absolute: 0.4 10*3/uL (ref 0.0–0.7)
Eosinophils Relative: 5.2 % — ABNORMAL HIGH (ref 0.0–5.0)
HCT: 34.1 % — ABNORMAL LOW (ref 36.0–46.0)
Hemoglobin: 11 g/dL — ABNORMAL LOW (ref 12.0–15.0)
Lymphocytes Relative: 29.3 % (ref 12.0–46.0)
Lymphs Abs: 2.1 10*3/uL (ref 0.7–4.0)
MCHC: 32.1 g/dL (ref 30.0–36.0)
MCV: 77.7 fl — ABNORMAL LOW (ref 78.0–100.0)
Monocytes Absolute: 0.6 10*3/uL (ref 0.1–1.0)
Monocytes Relative: 7.9 % (ref 3.0–12.0)
Neutro Abs: 4 10*3/uL (ref 1.4–7.7)
Neutrophils Relative %: 57.1 % (ref 43.0–77.0)
Platelets: 175 10*3/uL (ref 150.0–400.0)
RBC: 4.39 Mil/uL (ref 3.87–5.11)
RDW: 20.7 % — ABNORMAL HIGH (ref 11.5–15.5)
WBC: 7 10*3/uL (ref 4.0–10.5)

## 2019-07-30 LAB — IBC + FERRITIN
Ferritin: 6.8 ng/mL — ABNORMAL LOW (ref 10.0–291.0)
Iron: 90 ug/dL (ref 42–145)
Saturation Ratios: 18.2 % — ABNORMAL LOW (ref 20.0–50.0)
Transferrin: 353 mg/dL (ref 212.0–360.0)

## 2019-07-30 LAB — VITAMIN B12: Vitamin B-12: 352 pg/mL (ref 211–911)

## 2019-07-30 NOTE — Telephone Encounter (Signed)
If you are able to tolerate it try to take 2 a day Recheck cbcd and ibc in 4-6 weeks

## 2019-09-01 ENCOUNTER — Ambulatory Visit: Payer: BC Managed Care – PPO | Admitting: Family Medicine

## 2019-09-01 ENCOUNTER — Other Ambulatory Visit: Payer: Self-pay

## 2019-09-01 ENCOUNTER — Encounter: Payer: Self-pay | Admitting: Family Medicine

## 2019-09-01 VITALS — BP 114/83 | HR 83 | Temp 98.8°F | Resp 16 | Ht 64.0 in | Wt 151.8 lb

## 2019-09-01 DIAGNOSIS — E559 Vitamin D deficiency, unspecified: Secondary | ICD-10-CM | POA: Diagnosis not present

## 2019-09-01 DIAGNOSIS — L609 Nail disorder, unspecified: Secondary | ICD-10-CM | POA: Insufficient documentation

## 2019-09-01 DIAGNOSIS — M21611 Bunion of right foot: Secondary | ICD-10-CM | POA: Diagnosis not present

## 2019-09-01 DIAGNOSIS — E538 Deficiency of other specified B group vitamins: Secondary | ICD-10-CM

## 2019-09-01 DIAGNOSIS — G5603 Carpal tunnel syndrome, bilateral upper limbs: Secondary | ICD-10-CM | POA: Diagnosis not present

## 2019-09-01 LAB — CBC WITH DIFFERENTIAL/PLATELET
Basophils Absolute: 0.1 10*3/uL (ref 0.0–0.1)
Basophils Relative: 0.8 % (ref 0.0–3.0)
Eosinophils Absolute: 0.4 10*3/uL (ref 0.0–0.7)
Eosinophils Relative: 5.6 % — ABNORMAL HIGH (ref 0.0–5.0)
HCT: 39.5 % (ref 36.0–46.0)
Hemoglobin: 13.1 g/dL (ref 12.0–15.0)
Lymphocytes Relative: 31.9 % (ref 12.0–46.0)
Lymphs Abs: 2.2 10*3/uL (ref 0.7–4.0)
MCHC: 33.2 g/dL (ref 30.0–36.0)
MCV: 82.5 fl (ref 78.0–100.0)
Monocytes Absolute: 0.5 10*3/uL (ref 0.1–1.0)
Monocytes Relative: 7 % (ref 3.0–12.0)
Neutro Abs: 3.8 10*3/uL (ref 1.4–7.7)
Neutrophils Relative %: 54.7 % (ref 43.0–77.0)
Platelets: 153 10*3/uL (ref 150.0–400.0)
RBC: 4.79 Mil/uL (ref 3.87–5.11)
RDW: 21.7 % — ABNORMAL HIGH (ref 11.5–15.5)
WBC: 7 10*3/uL (ref 4.0–10.5)

## 2019-09-01 LAB — COMPREHENSIVE METABOLIC PANEL
ALT: 11 U/L (ref 0–35)
AST: 12 U/L (ref 0–37)
Albumin: 4.4 g/dL (ref 3.5–5.2)
Alkaline Phosphatase: 51 U/L (ref 39–117)
BUN: 13 mg/dL (ref 6–23)
CO2: 28 mEq/L (ref 19–32)
Calcium: 9.7 mg/dL (ref 8.4–10.5)
Chloride: 103 mEq/L (ref 96–112)
Creatinine, Ser: 0.9 mg/dL (ref 0.40–1.20)
GFR: 68.11 mL/min (ref >60.00)
Glucose, Bld: 89 mg/dL (ref 70–99)
Potassium: 4.2 mEq/L (ref 3.5–5.1)
Sodium: 137 mEq/L (ref 135–145)
Total Bilirubin: 0.5 mg/dL (ref 0.2–1.2)
Total Protein: 7.4 g/dL (ref 6.0–8.3)

## 2019-09-01 LAB — IBC + FERRITIN
Ferritin: 10.2 ng/mL (ref 10.0–291.0)
Iron: 176 ug/dL — ABNORMAL HIGH (ref 42–145)
Saturation Ratios: 36.5 % (ref 20.0–50.0)
Transferrin: 344 mg/dL (ref 212.0–360.0)

## 2019-09-01 LAB — TSH: TSH: 1.05 u[IU]/mL (ref 0.35–4.50)

## 2019-09-01 LAB — VITAMIN B12: Vitamin B-12: 277 pg/mL (ref 211–911)

## 2019-09-01 MED ORDER — CICLOPIROX 8 % EX SOLN: Freq: Every day | CUTANEOUS | 3 refills | Status: DC

## 2019-09-01 NOTE — Progress Notes (Signed)
. Patient ID: Rachel Wiley, female    DOB: 28-Oct-1975  Age: 44 y.o. MRN: [id number]    Subjective:  Subjective  HPI Rachel Wiley presents for nail abnormality   Her nails on her R hand and foot are flaking and have ridges in her nails --- it has now started on her L hand.    Review of Systems  Constitutional: Negative for activity change, appetite change, fatigue and unexpected weight change.  Respiratory: Negative for cough and shortness of breath.   Cardiovascular: Negative for chest pain and palpitations.  Skin: Positive for color change.  Psychiatric/Behavioral: Negative for behavioral problems and dysphoric mood. The patient is not nervous/anxious.     History Past Medical History:  Diagnosis Date  . Anxiety   . Headache(784.0)   . Lipoma of unspecified site   . Other acute reactions to stress   . Palpitations   . SVD (spontaneous vaginal delivery) 06/18/2011  . Tachycardia     She has a past surgical history that includes No past surgeries.   Her family history includes Hypertension in her mother and another family member.She reports that she has never smoked. She has never used smokeless tobacco. She reports that she does not drink alcohol and does not use drugs.  Current Outpatient Medications on File Prior to Visit  Medication Sig Dispense Refill  . ammonium lactate (LAC-HYDRIN) 12 % lotion Apply 1 application topically in the morning and at bedtime. 400 g 2  . azelastine (ASTELIN) 0.1 % nasal spray Place 1 spray into both nostrils 2 (two) times daily. Use in each nostril as directed 30 mL 12  . famotidine (PEPCID) 20 MG tablet TAKE 1 TABLET BY MOUTH TWICE A DAY 60 tablet 5  . Ferrous Sulfate (IRON) 325 (65 Fe) MG TABS Take 1 tablet (325 mg total) by mouth daily. 30 tablet 2  . fluticasone (FLONASE) 50 MCG/ACT nasal spray Place 2 sprays into both nostrils as needed for allergies or rhinitis. 16 g 5  . hydrocortisone (ANUSOL-HC) 25 MG suppository Place 1 suppository (25 mg  total) rectally 2 (two) times daily. 12 suppository 0  . pramoxine (PROCTOFOAM) 1 % foam Place 1 application rectally 3 (three) times daily as needed for anal itching. 15 g 0  . propranolol (INDERAL) 20 MG tablet Take 0.5 tablets (10 mg total) by mouth 2 (two) times daily. 90 tablet 1   No current facility-administered medications on file prior to visit.     Objective:  Objective  Physical Exam Vitals and nursing note reviewed.  Constitutional:      Appearance: She is well-developed.  HENT:     Head: Normocephalic and atraumatic.  Eyes:     Conjunctiva/sclera: Conjunctivae normal.  Neck:     Thyroid: No thyromegaly.     Vascular: No carotid bruit or JVD.  Cardiovascular:     Rate and Rhythm: Normal rate and regular rhythm.     Heart sounds: Normal heart sounds. No murmur heard.   Pulmonary:     Effort: Pulmonary effort is normal. No respiratory distress.     Breath sounds: Normal breath sounds. No wheezing or rales.  Chest:     Chest wall: No tenderness.  Musculoskeletal:     Cervical back: Normal range of motion and neck supple.     Comments: r foot-- bunion  Skin:    Comments: Nails R hand and foot---- have ridges , flaking and L hand has started as well  Neurological:     Mental  Status: She is alert and oriented to person, place, and time.    BP 114/83 (BP Location: Right Arm, Patient Position: Sitting, Cuff Size: Normal)   Pulse 83   Temp 98.8 F (37.1 C) (Oral)   Resp 16   Ht 5\' 4"  (1.626 m)   Wt 151 lb 12.8 oz (68.9 kg)   SpO2 100%   BMI 26.06 kg/m  Wt Readings from Last 3 Encounters:  09/01/19 151 lb 12.8 oz (68.9 kg)  04/17/19 152 lb 6.4 oz (69.1 kg)  02/03/19 155 lb (70.3 kg)     Lab Results  Component Value Date   WBC 7.0 07/30/2019   HGB 11.0 (L) 07/30/2019   HCT 34.1 (L) 07/30/2019   PLT 175.0 07/30/2019   GLUCOSE 93 04/17/2019   CHOL 163 04/17/2019   TRIG 94.0 04/17/2019   HDL 39.20 04/17/2019   LDLCALC 105 (H) 04/17/2019   ALT 14  04/17/2019   AST 15 04/17/2019   NA 137 04/17/2019   K 4.6 04/17/2019   CL 103 04/17/2019   CREATININE 0.86 04/17/2019   BUN 14 04/17/2019   CO2 27 04/17/2019   TSH 1.09 04/17/2019   HGBA1C 5.3 03/03/2015    MM 3D SCREEN BREAST BILATERAL  Result Date: 06/26/2019 CLINICAL DATA:  Screening. EXAM: DIGITAL SCREENING BILATERAL MAMMOGRAM WITH TOMO AND CAD COMPARISON:  Previous exam(s). ACR Breast Density Category d: The breast tissue is extremely dense, which lowers the sensitivity of mammography FINDINGS: There are no findings suspicious for malignancy. Images were processed with CAD. IMPRESSION: No mammographic evidence of malignancy. A result letter of this screening mammogram will be mailed directly to the patient. RECOMMENDATION: Screening mammogram in one year. (Code:SM-B-01Y) BI-RADS CATEGORY  1: Negative. Electronically Signed   By: Evangeline Dakin M.D.   On: 06/26/2019 15:13     Assessment & Plan:  Plan  I am having Rachel Wiley start on ciclopirox. I am also having her maintain her azelastine, fluticasone, hydrocortisone, pramoxine, ammonium lactate, Iron, famotidine, and propranolol.  Meds ordered this encounter  Medications  . ciclopirox (PENLAC) 8 % solution    Sig: Apply topically at bedtime. Apply over nail and surrounding skin. Apply daily over previous coat. After seven (7) days, may remove with alcohol and continue cycle.    Dispense:  6.6 mL    Refill:  3    Problem List Items Addressed This Visit    None    Visit Diagnoses    Nail abnormalities    -  Primary   Relevant Medications   ciclopirox (PENLAC) 8 % solution   Other Relevant Orders   TSH   Vitamin B12   CBC with Differential/Platelet   Comprehensive metabolic panel   Vitamin D 1,25 dihydroxy   IBC + Ferritin   Bilateral carpal tunnel syndrome       Relevant Orders   TSH   Vitamin B12   CBC with Differential/Platelet   Comprehensive metabolic panel   Vitamin D 1,25 dihydroxy   IBC + Ferritin    Bunion, right foot       Relevant Orders   Ambulatory referral to Podiatry      Follow-up: Return if symptoms worsen or fail to improve.  [name], DO

## 2019-09-01 NOTE — Patient Instructions (Signed)
Carpal Tunnel Syndrome  Carpal tunnel syndrome is a condition that causes pain in your hand and arm. The carpal tunnel is a narrow area located on the palm side of your wrist. Repeated wrist motion or certain diseases may cause swelling within the tunnel. This swelling pinches the main nerve in the wrist (median nerve). What are the causes? This condition may be caused by:  Repeated wrist motions.  Wrist injuries.  Arthritis.  A cyst or tumor in the carpal tunnel.  Fluid buildup during pregnancy. Sometimes the cause of this condition is not known. What increases the risk? The following factors may make you more likely to develop this condition:  Having a job, such as being a butcher or a cashier, that requires you to repeatedly move your wrist in the same motion.  Being a woman.  Having certain conditions, such as: ? Diabetes. ? Obesity. ? An underactive thyroid (hypothyroidism). ? Kidney failure. What are the signs or symptoms? Symptoms of this condition include:  A tingling feeling in your fingers, especially in your thumb, index, and middle fingers.  Tingling or numbness in your hand.  An aching feeling in your entire arm, especially when your wrist and elbow are bent for a long time.  Wrist pain that goes up your arm to your shoulder.  Pain that goes down into your palm or fingers.  A weak feeling in your hands. You may have trouble grabbing and holding items. Your symptoms may feel worse during the night. How is this diagnosed? This condition is diagnosed with a medical history and physical exam. You may also have tests, including:  Electromyogram (EMG). This test measures electrical signals sent by your nerves into the muscles.  Nerve conduction study. This test measures how well electrical signals pass through your nerves.  Imaging tests, such as X-rays, ultrasound, and MRI. These tests check for possible causes of your condition. How is this treated? This  condition may be treated with:  Lifestyle changes. It is important to stop or change the activity that caused your condition.  Doing exercise and activities to strengthen your muscles and bones (physical therapy).  Learning how to use your hand again after diagnosis (occupational therapy).  Medicines for pain and inflammation. This may include medicine that is injected into your wrist.  A wrist splint.  Surgery. Follow these instructions at home: If you have a splint:  Wear the splint as told by your health care provider. Remove it only as told by your health care provider.  Loosen the splint if your fingers tingle, become numb, or turn cold and blue.  Keep the splint clean.  If the splint is not waterproof: ? Do not let it get wet. ? Cover it with a watertight covering when you take a bath or shower. Managing pain, stiffness, and swelling   If directed, put ice on the painful area: ? If you have a removable splint, remove it as told by your health care provider. ? Put ice in a plastic bag. ? Place a towel between your skin and the bag. ? Leave the ice on for 20 minutes, 2-3 times per day. General instructions  Take over-the-counter and prescription medicines only as told by your health care provider.  Rest your wrist from any activity that may be causing your pain. If your condition is work related, talk with your employer about changes that can be made, such as getting a wrist pad to use while typing.  Do any exercises as told   by your health care provider, physical therapist, or occupational therapist.  Keep all follow-up visits as told by your health care provider. This is important. Contact a health care provider if:  You have new symptoms.  Your pain is not controlled with medicines.  Your symptoms get worse. Get help right away if:  You have severe numbness or tingling in your wrist or hand. Summary  Carpal tunnel syndrome is a condition that causes pain in  your hand and arm.  It is usually caused by repeated wrist motions.  Lifestyle changes and medicines are used to treat carpal tunnel syndrome. Surgery may be recommended.  Follow your health care provider's instructions about wearing a splint, resting from activity, keeping follow-up visits, and calling for help. This information is not intended to replace advice given to you by your health care provider. Make sure you discuss any questions you have with your health care provider. Document Revised: 06/07/2017 Document Reviewed: 06/07/2017 Elsevier Patient Education  2020 [employer]  

## 2019-09-02 ENCOUNTER — Encounter: Payer: Self-pay | Admitting: Family Medicine

## 2019-09-02 NOTE — Telephone Encounter (Signed)
She can go back to take [facility] with iron and stop the rx iron

## 2019-09-02 NOTE — Telephone Encounter (Signed)
Pt has been scheduled for B12 injection. Please advise the patients concerns about her iron

## 2019-09-03 ENCOUNTER — Other Ambulatory Visit: Payer: Self-pay | Admitting: Family Medicine

## 2019-09-04 LAB — VITAMIN D 1,25 DIHYDROXY
Vitamin D 1, 25 (OH)2 Total: 36 pg/mL (ref 18–72)
Vitamin D2 1, 25 (OH)2: <8 pg/mL
Vitamin D3 1, 25 (OH)2: 36 pg/mL

## 2019-09-08 MED ORDER — VITAMIN D (ERGOCALCIFEROL) 1.25 MG (50000 UNIT) PO CAPS: 50000.0000 [IU] | ORAL_CAPSULE | ORAL | 0 refills | Status: DC

## 2019-09-08 NOTE — Addendum Note (Signed)
Addended by: Wynonia Musty A on: 09/08/2019 09:12 AM   Modules accepted: Orders

## 2019-09-16 ENCOUNTER — Ambulatory Visit: Payer: BC Managed Care – PPO

## 2019-09-16 NOTE — Progress Notes (Signed)
done

## 2019-09-16 NOTE — Assessment & Plan Note (Signed)
Wrist splint given to pt  Call if no improvement and we will refer to hand surgeon

## 2019-09-17 ENCOUNTER — Other Ambulatory Visit: Payer: Self-pay

## 2019-09-17 ENCOUNTER — Ambulatory Visit (INDEPENDENT_AMBULATORY_CARE_PROVIDER_SITE_OTHER): Payer: BC Managed Care – PPO | Admitting: *Deleted

## 2019-09-17 DIAGNOSIS — E539 Vitamin B deficiency, unspecified: Secondary | ICD-10-CM

## 2019-09-17 MED ORDER — CYANOCOBALAMIN 1000 MCG/ML IJ SOLN
1000.0000 ug | Freq: Once | INTRAMUSCULAR | Status: AC
  Administered 2019-09-17: 1000 ug via INTRAMUSCULAR

## 2019-09-17 NOTE — Progress Notes (Signed)
Patient here to begin her monthly b12 injections per Dr. Carollee Herter.  Injection given in left deltoid and patient tolerated well.

## 2019-09-18 DIAGNOSIS — R002 Palpitations: Secondary | ICD-10-CM | POA: Diagnosis not present

## 2019-09-29 ENCOUNTER — Other Ambulatory Visit: Payer: Self-pay

## 2019-09-29 ENCOUNTER — Ambulatory Visit (INDEPENDENT_AMBULATORY_CARE_PROVIDER_SITE_OTHER): Payer: BC Managed Care – PPO

## 2019-09-29 ENCOUNTER — Ambulatory Visit (INDEPENDENT_AMBULATORY_CARE_PROVIDER_SITE_OTHER): Payer: BC Managed Care – PPO | Admitting: Podiatry

## 2019-09-29 ENCOUNTER — Encounter: Payer: Self-pay | Admitting: Podiatry

## 2019-09-29 DIAGNOSIS — M2011 Hallux valgus (acquired), right foot: Secondary | ICD-10-CM

## 2019-09-30 NOTE — Progress Notes (Signed)
Subjective:  Patient ID: Rachel Wiley, female    DOB: [birth year],  MRN: [id number] HPI Chief Complaint  Patient presents with  . 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 New Patient (Initial Visit)    44 y.o. female presents with the above complaint.   ROS: Denies fever chills nausea vomiting muscle aches pains calf pain back pain chest pain shortness of breath.  Past Medical History:  Diagnosis Date  . Anxiety   . Headache(784.0)   . Lipoma of unspecified site   . Other acute reactions to stress   . Palpitations   . SVD (spontaneous vaginal delivery) 06/18/2011  . Tachycardia    Past Surgical History:  Procedure Laterality Date  . NO PAST SURGERIES      Current Outpatient Medications:  .  ammonium lactate (LAC-HYDRIN) 12 % lotion, Apply 1 application topically in the morning and at bedtime., Disp: 400 g, Rfl: 2 .  azelastine (ASTELIN) 0.1 % nasal spray, Place 1 spray into both nostrils 2 (two) times daily. Use in each nostril as directed, Disp: 30 mL, Rfl: 12 .  ciclopirox (PENLAC) 8 % solution, Apply topically at bedtime. Apply over nail and surrounding skin. Apply daily over previous coat. After seven (7) days, may remove with alcohol and continue cycle., Disp: 6.6 mL, Rfl: 3 .  famotidine (PEPCID) 20 MG tablet, TAKE 1 TABLET BY MOUTH TWICE A DAY, Disp: 60 tablet, Rfl: 5 .  ferrous sulfate 325 (65 FE) MG tablet, TAKE 1 TABLET BY MOUTH EVERY DAY, Disp: 30 tablet, Rfl: 2 .  fluticasone (FLONASE) 50 MCG/ACT nasal spray, Place 2 sprays into both nostrils as needed for allergies or rhinitis., Disp: 16 g, Rfl: 5 .  hydrocortisone (ANUSOL-HC) 25 MG suppository, Place 1 suppository (25 mg total) rectally 2 (two) times daily., Disp: 12 suppository, Rfl: 0 .  pramoxine (PROCTOFOAM) 1 % foam, Place 1 application rectally 3 (three) times daily as needed for anal itching., Disp: 15 g, Rfl: 0 .  propranolol (INDERAL) 20  MG tablet, Take 0.5 tablets (10 mg total) by mouth 2 (two) times daily., Disp: 90 tablet, Rfl: 1 .  Vitamin D, Ergocalciferol, (DRISDOL) 1.25 MG (50000 UNIT) CAPS capsule, Take 1 capsule (50,000 Units total) by mouth every 7 (seven) days., Disp: 12 capsule, Rfl: 0  Allergies  Allergen Reactions  . Cetirizine & Related Palpitations    Per pt   Review of Systems Objective:  There were no vitals filed for this visit.  General: Well developed, nourished, in no acute distress, alert and oriented x3   Dermatological: Skin is warm, dry and supple bilateral. Nails x 10 are well maintained; remaining integument appears unremarkable at this time. There are no open sores, no preulcerative lesions, no rash or signs of infection present.  Vascular: Dorsalis Pedis artery and Posterior Tibial artery pedal pulses are 2/4 bilateral with immedate capillary fill time. Pedal hair growth present. No varicosities and no lower extremity edema present bilateral.   Neruologic: Grossly intact via light touch bilateral. Vibratory intact via tuning fork bilateral. Protective threshold with Semmes Wienstein monofilament intact to all pedal sites bilateral. Patellar and Achilles deep tendon reflexes 2+ bilateral. No Babinski or clonus noted bilateral.   Musculoskeletal: No gross boney pedal deformities bilateral. No pain, crepitus, or limitation noted with foot and ankle range of motion bilateral. Muscular strength 5/5 in all groups tested bilateral.  Mild tenderness on palpation and limited  range of motion of the first metatarsophalangeal joint of the left foot.  Mild tenderness on palpation medial aspect of the metatarsal head.  Gait: Unassisted, Nonantalgic.    Radiographs:  Radiographs taken today demonstrate an osseously mature individual right foot demonstrating no acute injuries.  No increase in the first intermetatarsal angle.  There is all within normal limits.  Possibly a slight spur dorsally.  Assessment &  Plan:   Assessment: Very mild bunion deformity.  Mild hallux limitus.  Plan: Discussed etiology pathology conservative surgical therapies at this point discussed the use of diclofenac gel wider shoes which she has already purchased and I recommended surgery within the next couple of years.  She will follow up with me at that time.     Carlson Belland T. [place], [place]

## 2019-10-20 ENCOUNTER — Ambulatory Visit: Payer: BC Managed Care – PPO

## 2019-10-27 ENCOUNTER — Ambulatory Visit: Payer: BC Managed Care – PPO

## 2019-10-27 ENCOUNTER — Other Ambulatory Visit: Payer: Self-pay

## 2019-10-27 ENCOUNTER — Encounter: Payer: Self-pay | Admitting: Family Medicine

## 2019-10-27 ENCOUNTER — Telehealth (INDEPENDENT_AMBULATORY_CARE_PROVIDER_SITE_OTHER): Payer: BC Managed Care – PPO | Admitting: Family Medicine

## 2019-10-27 VITALS — HR 99 | Ht 64.0 in | Wt 149.0 lb

## 2019-10-27 DIAGNOSIS — J0140 Acute pansinusitis, unspecified: Secondary | ICD-10-CM | POA: Diagnosis not present

## 2019-10-27 MED ORDER — AMOXICILLIN-POT CLAVULANATE 875-125 MG PO TABS: 1.0000 | ORAL_TABLET | Freq: Two times a day (BID) | ORAL | 0 refills | Status: DC

## 2019-10-27 NOTE — Progress Notes (Signed)
Virtual Visit via Video Note  I connected with Glyn Ade on 10/27/19 at  4:40 PM EDT by a video enabled telemedicine application and verified that I am speaking with the correct person using two identifiers.  Location: Patient: home alone  Provider: office    I discussed the limitations of evaluation and management by telemedicine and the availability of in person appointments. The patient expressed understanding and agreed to proceed.  History of Present Illness: Pt is home c/o pnd, sore throat -- she is taking the flonase and astelin spray which helps some   She also has sinus pressure and green mucus x last Monday   .Observations/Objective: Vitals:   10/27/19 1647  Pulse: 99  SpO2: 100%    Pt is in nad  Tenderness over max sinuses   Assessment and Plan: 1. Acute non-recurrent pansinusitis flonase and astelin ----con't  Add abx Can take benadryl prn and or mucinex for cough if needed Call prn  - amoxicillin-clavulanate (AUGMENTIN) 875-125 MG tablet; Take 1 tablet by mouth 2 (two) times daily.  Dispense: 20 tablet; Refill: 0  Follow Up Instructions:    I discussed the assessment and treatment plan with the patient. The patient was provided an opportunity to ask questions and all were answered. The patient agreed with the plan and demonstrated an understanding of the instructions.   The patient was advised to call back or seek an in-person evaluation if the symptoms worsen or if the condition fails to improve as anticipated.  I provided 25 minutes of non-face-to-face time during this encounter.   [name], DO

## 2019-11-10 ENCOUNTER — Encounter: Payer: Self-pay | Admitting: Family Medicine

## 2019-11-10 ENCOUNTER — Ambulatory Visit (INDEPENDENT_AMBULATORY_CARE_PROVIDER_SITE_OTHER): Payer: BC Managed Care – PPO

## 2019-11-10 ENCOUNTER — Other Ambulatory Visit: Payer: Self-pay

## 2019-11-10 DIAGNOSIS — Z23 Encounter for immunization: Secondary | ICD-10-CM | POA: Diagnosis not present

## 2019-11-10 DIAGNOSIS — E538 Deficiency of other specified B group vitamins: Secondary | ICD-10-CM

## 2019-11-10 MED ORDER — CYANOCOBALAMIN 1000 MCG/ML IJ SOLN
1000.0000 ug | Freq: Once | INTRAMUSCULAR | Status: AC
  Administered 2019-11-10: 1000 ug via INTRAMUSCULAR

## 2019-11-10 NOTE — Progress Notes (Signed)
Pt here for monthly B12 injection per Dr. Etter Sjogren  B12 1074mcg given IM in left deltoid, and pt tolerated injection well.  Next B12 injection scheduled for next month.   Patient also here for flu vaccine. 0.3mL given in right deltoid IM.

## 2019-11-11 ENCOUNTER — Other Ambulatory Visit: Payer: Self-pay | Admitting: Family Medicine

## 2019-11-11 DIAGNOSIS — B3731 Acute candidiasis of vulva and vagina: Secondary | ICD-10-CM

## 2019-11-11 MED ORDER — FLUCONAZOLE 150 MG PO TABS: ORAL_TABLET | ORAL | 0 refills | Status: DC

## 2019-11-11 NOTE — Telephone Encounter (Signed)
Please advise 

## 2019-11-11 NOTE — Telephone Encounter (Signed)
Diflucan sent in

## 2019-11-18 ENCOUNTER — Other Ambulatory Visit: Payer: Self-pay | Admitting: Family Medicine

## 2019-11-18 DIAGNOSIS — E559 Vitamin D deficiency, unspecified: Secondary | ICD-10-CM

## 2019-12-10 ENCOUNTER — Ambulatory Visit: Payer: BC Managed Care – PPO

## 2019-12-16 ENCOUNTER — Ambulatory Visit (INDEPENDENT_AMBULATORY_CARE_PROVIDER_SITE_OTHER): Payer: BC Managed Care – PPO | Admitting: Family

## 2019-12-16 ENCOUNTER — Other Ambulatory Visit (HOSPITAL_COMMUNITY)
Admission: RE | Admit: 2019-12-16 | Discharge: 2019-12-16 | Disposition: A | Discharge status: Home / Self Care | Payer: BC Managed Care – PPO | Source: Ambulatory Visit | Attending: Family | Admitting: Family

## 2019-12-16 ENCOUNTER — Telehealth: Payer: BC Managed Care – PPO | Admitting: Family

## 2019-12-16 ENCOUNTER — Other Ambulatory Visit: Payer: Self-pay

## 2019-12-16 ENCOUNTER — Encounter: Payer: Self-pay | Admitting: Family

## 2019-12-16 VITALS — BP 127/74 | HR 78 | Temp 99.4°F | Resp 16 | Ht 64.0 in | Wt 153.0 lb

## 2019-12-16 DIAGNOSIS — N760 Acute vaginitis: Secondary | ICD-10-CM | POA: Insufficient documentation

## 2019-12-16 DIAGNOSIS — B373 Candidiasis of vulva and vagina: Secondary | ICD-10-CM | POA: Insufficient documentation

## 2019-12-16 MED ORDER — FLUCONAZOLE 150 MG PO TABS: ORAL_TABLET | ORAL | 0 refills | Status: DC

## 2019-12-16 NOTE — Patient Instructions (Signed)
You may use vagisil cream externally as needed for itching. Begin diflucan pill for yeast infection. Please call if symptoms worsen or if symptoms do not improve in the next 1 week.

## 2019-12-16 NOTE — Progress Notes (Signed)
Subjective:    Patient ID: Rachel Wiley, female    DOB: February 03, 1976, 44 y.o.   MRN: [id number]  HPI  Patient is a 44 yr old female who presents today with chief complaint of vaginal itching and discharge. Symptoms began 3 days ago.   8/28- tdap and vaccine, varicella and MMR.    Review of Systems See HPI  Past Medical History:  Diagnosis Date  . Anxiety   . Headache(784.0)   . Lipoma of unspecified site   . Other acute reactions to stress   . Palpitations   . SVD (spontaneous vaginal delivery) 06/18/2011  . Tachycardia      Social History   Socioeconomic History  . Marital status: Married    Spouse name: Not on file  . Number of children: Not on file  . Years of education: Not on file  . 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 Smoking status: Never Smoker  . Smokeless tobacco: Never Used  Substance and Sexual Activity  . Alcohol use: No  . Drug use: No  . Sexual activity: Yes  Other Topics Concern  . Not on file  Social History Narrative   Never Smoked   Alcohol use-no   Drug use-no   Smoking Status:  never   Drug Use:  No   Exercise--- walking            Social Determinants of Health   Financial Resource Strain:   . Difficulty of Paying Living Expenses: Not on file  Food Insecurity:   . Worried About Charity fundraiser in the Last Year: Not on file  . Ran Out of Food in the Last Year: Not on file  Transportation Needs:   . Lack of Transportation (Medical): Not on file  . Lack of Transportation (Non-Medical): Not on file  Physical Activity:   . Days of Exercise per Week: Not on file  . Minutes of Exercise per Session: Not on file  Stress:   . Feeling of Stress : Not on file  Social Connections:   . Frequency of Communication with Friends and Family: Not on file  . Frequency of Social Gatherings with Friends and Family: Not on file  . Attends Religious Services: Not on file    . Active Member of Clubs or Organizations: Not on file  . Attends Archivist Meetings: Not on file  . Marital Status: Not on file  Intimate Partner Violence:   . Fear of Current or Ex-Partner: Not on file  . Emotionally Abused: Not on file  . Physically Abused: Not on file  . Sexually Abused: Not on file    Past Surgical History:  Procedure Laterality Date  . NO PAST SURGERIES      Family History  Problem Relation Age of Onset  . Hypertension Mother   . Hypertension Other   . Anesthesia problems Neg Hx   . Hypotension Neg Hx   . Malignant hyperthermia Neg Hx   . Pseudochol deficiency Neg Hx     Allergies  Allergen Reactions  . Cetirizine & Related Palpitations    Per pt    Current Outpatient Medications on File Prior to Visit  Medication Sig Dispense Refill  . ammonium lactate (LAC-HYDRIN) 12 % lotion Apply 1 application topically in the morning and at bedtime. 400 g 2  . azelastine (ASTELIN) 0.1 % nasal spray Place 1 spray into both  nostrils 2 (two) times daily. Use in each nostril as directed 30 mL 12  . ciclopirox (PENLAC) 8 % solution Apply topically at bedtime. Apply over nail and surrounding skin. Apply daily over previous coat. After seven (7) days, may remove with alcohol and continue cycle. 6.6 mL 3  . famotidine (PEPCID) 20 MG tablet TAKE 1 TABLET BY MOUTH TWICE A DAY 60 tablet 5  . ferrous sulfate 325 (65 FE) MG tablet TAKE 1 TABLET BY MOUTH EVERY DAY 30 tablet 2  . fluticasone (FLONASE) 50 MCG/ACT nasal spray Place 2 sprays into both nostrils as needed for allergies or rhinitis. 16 g 5  . hydrocortisone (ANUSOL-HC) 25 MG suppository Place 1 suppository (25 mg total) rectally 2 (two) times daily. 12 suppository 0  . pramoxine (PROCTOFOAM) 1 % foam Place 1 application rectally 3 (three) times daily as needed for anal itching. 15 g 0  . propranolol (INDERAL) 20 MG tablet Take 0.5 tablets (10 mg total) by mouth 2 (two) times daily. 90 tablet 1  . Vitamin  D, Ergocalciferol, (DRISDOL) 1.25 MG (50000 UNIT) CAPS capsule TAKE 1 CAPSULE (50,000 UNITS TOTAL) BY MOUTH EVERY 7 (SEVEN) DAYS. 12 capsule 1   No current facility-administered medications on file prior to visit.    BP 127/74 (BP Location: Right Arm, Patient Position: Sitting, Cuff Size: Small)   Pulse 78   Temp 99.4 F (37.4 C) (Oral)   Resp 16   Ht $R'5\' 4"'xJ$  (1.626 m)   Wt 153 lb (69.4 kg)   SpO2 100%   BMI 26.26 kg/m       Objective:   Physical Exam Constitutional:      Appearance: She is well-developed.  Neck:     Thyroid: No thyromegaly.  Cardiovascular:     Rate and Rhythm: Normal rate and regular rhythm.     Heart sounds: Normal heart sounds. No murmur heard.   Pulmonary:     Effort: Pulmonary effort is normal. No respiratory distress.     Breath sounds: Normal breath sounds. No wheezing.  Genitourinary:    Labia:        Right: No rash.        Left: No rash.   Musculoskeletal:     Cervical back: Neck supple.  Skin:    General: Skin is warm and dry.  Neurological:     Mental Status: She is alert and oriented to person, place, and time.  Psychiatric:        Behavior: Behavior normal.        Thought Content: Thought content normal.        Judgment: Judgment normal.           Assessment & Plan:  Vaginitis- clinically sounds like yeast. Will rx with diflucan. Swab will be sent for yeast and BV. Discussed importance of cotton underwear, rest, yogurt to help decrease chance of recurrence.  This visit occurred during the SARS-CoV-2 public health emergency.  Safety protocols were in place, including screening questions prior to the visit, additional usage of staff PPE, and extensive cleaning of exam room while observing appropriate contact time as indicated for disinfecting solutions.

## 2019-12-17 LAB — CERVICOVAGINAL ANCILLARY ONLY
Bacterial Vaginitis (gardnerella): NEGATIVE
Candida Glabrata: NEGATIVE
Candida Vaginitis: POSITIVE — AB
Comment: NEGATIVE
Comment: NEGATIVE
Comment: NEGATIVE

## 2019-12-22 ENCOUNTER — Other Ambulatory Visit: Payer: Self-pay

## 2019-12-22 ENCOUNTER — Ambulatory Visit (INDEPENDENT_AMBULATORY_CARE_PROVIDER_SITE_OTHER): Payer: BC Managed Care – PPO

## 2019-12-22 DIAGNOSIS — E538 Deficiency of other specified B group vitamins: Secondary | ICD-10-CM

## 2019-12-22 MED ORDER — CYANOCOBALAMIN 1000 MCG/ML IJ SOLN
1000.0000 ug | Freq: Once | INTRAMUSCULAR | Status: AC
  Administered 2019-12-22: 1000 ug via INTRAMUSCULAR

## 2019-12-22 NOTE — Progress Notes (Signed)
Pt here for monthly B12 injection per Dr. Etter Sjogren  B12 1037mcg given IM left deltoid, and pt tolerated injection well.  Please advise is patient is to continue injections she would like to know.

## 2020-01-27 ENCOUNTER — Encounter: Payer: Self-pay | Admitting: Family Medicine

## 2020-01-27 DIAGNOSIS — R1013 Epigastric pain: Secondary | ICD-10-CM

## 2020-01-28 MED ORDER — FAMOTIDINE 20 MG PO TABS: 20.0000 mg | ORAL_TABLET | Freq: Two times a day (BID) | ORAL | 5 refills | Status: DC

## 2020-04-14 ENCOUNTER — Other Ambulatory Visit: Payer: Self-pay

## 2020-04-15 ENCOUNTER — Other Ambulatory Visit: Payer: Self-pay

## 2020-04-15 ENCOUNTER — Encounter: Payer: Self-pay | Admitting: Family Medicine

## 2020-04-15 ENCOUNTER — Encounter: Payer: BC Managed Care – PPO | Admitting: Family Medicine

## 2020-04-15 ENCOUNTER — Ambulatory Visit (INDEPENDENT_AMBULATORY_CARE_PROVIDER_SITE_OTHER): Payer: BC Managed Care – PPO | Admitting: Family Medicine

## 2020-04-15 VITALS — BP 110/70 | HR 74 | Temp 98.7°F | Resp 18 | Ht 64.0 in | Wt 160.4 lb

## 2020-04-15 DIAGNOSIS — R0982 Postnasal drip: Secondary | ICD-10-CM

## 2020-04-15 DIAGNOSIS — R519 Headache, unspecified: Secondary | ICD-10-CM

## 2020-04-15 DIAGNOSIS — E559 Vitamin D deficiency, unspecified: Secondary | ICD-10-CM

## 2020-04-15 DIAGNOSIS — Z0000 Encounter for general adult medical examination without abnormal findings: Secondary | ICD-10-CM | POA: Diagnosis not present

## 2020-04-15 DIAGNOSIS — L659 Nonscarring hair loss, unspecified: Secondary | ICD-10-CM | POA: Diagnosis not present

## 2020-04-15 DIAGNOSIS — R1013 Epigastric pain: Secondary | ICD-10-CM | POA: Diagnosis not present

## 2020-04-15 DIAGNOSIS — Z1159 Encounter for screening for other viral diseases: Secondary | ICD-10-CM | POA: Diagnosis not present

## 2020-04-15 LAB — COMPREHENSIVE METABOLIC PANEL
ALT: 10 U/L (ref 0–35)
AST: 11 U/L (ref 0–37)
Albumin: 3.8 g/dL (ref 3.5–5.2)
Alkaline Phosphatase: 52 U/L (ref 39–117)
BUN: 10 mg/dL (ref 6–23)
CO2: 27 mEq/L (ref 19–32)
Calcium: 8.7 mg/dL (ref 8.4–10.5)
Chloride: 106 mEq/L (ref 96–112)
Creatinine, Ser: 0.8 mg/dL (ref 0.40–1.20)
GFR: 89.67 mL/min (ref >60.00)
Glucose, Bld: 91 mg/dL (ref 70–99)
Potassium: 4.5 mEq/L (ref 3.5–5.1)
Sodium: 139 mEq/L (ref 135–145)
Total Bilirubin: 0.4 mg/dL (ref 0.2–1.2)
Total Protein: 6.7 g/dL (ref 6.0–8.3)

## 2020-04-15 LAB — CBC WITH DIFFERENTIAL/PLATELET
Basophils Absolute: 0 10*3/uL (ref 0.0–0.1)
Basophils Relative: 0.5 % (ref 0.0–3.0)
Eosinophils Absolute: 0.3 10*3/uL (ref 0.0–0.7)
Eosinophils Relative: 4 % (ref 0.0–5.0)
HCT: 36.4 % (ref 36.0–46.0)
Hemoglobin: 11.9 g/dL — ABNORMAL LOW (ref 12.0–15.0)
Lymphocytes Relative: 31.4 % (ref 12.0–46.0)
Lymphs Abs: 2.1 10*3/uL (ref 0.7–4.0)
MCHC: 32.8 g/dL (ref 30.0–36.0)
MCV: 81.9 fl (ref 78.0–100.0)
Monocytes Absolute: 0.5 10*3/uL (ref 0.1–1.0)
Monocytes Relative: 7 % (ref 3.0–12.0)
Neutro Abs: 3.8 10*3/uL (ref 1.4–7.7)
Neutrophils Relative %: 57.1 % (ref 43.0–77.0)
Platelets: 175 10*3/uL (ref 150.0–400.0)
RBC: 4.44 Mil/uL (ref 3.87–5.11)
RDW: 16.8 % — ABNORMAL HIGH (ref 11.5–15.5)
WBC: 6.6 10*3/uL (ref 4.0–10.5)

## 2020-04-15 LAB — LIPID PANEL
Cholesterol: 150 mg/dL (ref 0–200)
HDL: 37.8 mg/dL — ABNORMAL LOW (ref >39.00)
LDL Cholesterol: 91 mg/dL (ref 0–99)
NonHDL: 111.92
Total CHOL/HDL Ratio: 4
Triglycerides: 105 mg/dL (ref 0.0–149.0)
VLDL: 21 mg/dL (ref 0.0–40.0)

## 2020-04-15 LAB — VITAMIN D 25 HYDROXY (VIT D DEFICIENCY, FRACTURES): VITD: 29.43 ng/mL — ABNORMAL LOW (ref 30.00–100.00)

## 2020-04-15 LAB — VITAMIN B12: Vitamin B-12: 395 pg/mL (ref 211–911)

## 2020-04-15 LAB — TSH: TSH: 0.99 u[IU]/mL (ref 0.35–4.50)

## 2020-04-15 LAB — HEPATITIS C ANTIBODY
Hepatitis C Ab: NONREACTIVE
SIGNAL TO CUT-OFF: 0 (ref <1.00)

## 2020-04-15 MED ORDER — FAMOTIDINE 20 MG PO TABS: 20.0000 mg | ORAL_TABLET | Freq: Two times a day (BID) | ORAL | 5 refills | Status: DC

## 2020-04-15 MED ORDER — CICLOPIROX 1 % EX SHAM: MEDICATED_SHAMPOO | CUTANEOUS | 0 refills | Status: DC

## 2020-04-15 MED ORDER — AZELASTINE HCL 0.1 % NA SOLN: 1.0000 | Freq: Two times a day (BID) | NASAL | 12 refills | Status: DC

## 2020-04-15 NOTE — Progress Notes (Signed)
Subjective:     Rachel Wiley is a 45 y.o. female and is here for a comprehensive physical exam. The patient reports problems - her hair is thinning and she states her scalp is very sensitive when she moves her hair.  no rashes .    This has been going on for a few years   Social History   Socioeconomic History  . Marital status: Married    Spouse name: Not on file  . Number of children: Not on file  . Years of education: Not on file  . 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 Smoking status: Never Smoker  . Smokeless tobacco: Never Used  Substance and Sexual Activity  . Alcohol use: No  . Drug use: No  . Sexual activity: Yes  Other Topics Concern  . Not on file  Social History Narrative   Never Smoked   Alcohol use-no   Drug use-no   Smoking Status:  never   Drug Use:  No   Exercise--- walking            Social Determinants of Health   Financial Resource Strain: Not on file  Food Insecurity: Not on file  Transportation Needs: Not on file  Physical Activity: Not on file  Stress: Not on file  Social Connections: Not on file  Intimate Partner Violence: Not on file   Health Maintenance  Topic Date Due  . Hepatitis C Screening  Never done  . COVID-19 Vaccine (4 - Booster for Pfizer series) 06/04/2020  . MAMMOGRAM  06/25/2020  . PAP SMEAR-Modifier  04/11/2021  . TETANUS/TDAP  06/18/2021  . INFLUENZA VACCINE  Completed  . HIV Screening   Completed  . HPV VACCINES  Aged Out    The following portions of the patient's history were reviewed and updated as appropriate:  She  has a past medical history of Anxiety, Headache(784.0), Lipoma of unspecified site, Other acute reactions to stress, Palpitations, SVD (spontaneous vaginal delivery) (06/18/2011), and Tachycardia. She does not have any pertinent problems on file.  She  has a past surgical history that includes No past surgeries. Her family history includes Hypertension in her mother and another family member. She  reports that she has never smoked. She has never used smokeless tobacco. She reports that she does not drink alcohol and does not use drugs. She has a current medication list which includes the following prescription(s): azelastine, famotidine, fluticasone, propranolol, ammonium lactate, ciclopirox, ferrous sulfate, fluconazole, hydrocortisone, pramoxine, and vitamin d (ergocalciferol). Current Outpatient Medications on File Prior to Visit  Medication Sig Dispense Refill  . azelastine (ASTELIN) 0.1 % nasal spray Place 1 spray into both nostrils 2 (two) times daily. Use in each nostril as directed 30 mL 12  . famotidine (PEPCID) 20 MG tablet Take 1 tablet (20 mg total) by mouth 2 (two) times daily. 60 tablet 5  . fluticasone (FLONASE) 50 MCG/ACT nasal spray Place 2 sprays into both nostrils as needed for allergies or rhinitis. 16 g 5  . propranolol (INDERAL) 20 MG tablet Take 0.5 tablets (10 mg total) by mouth 2 (two) times daily. 90 tablet 1  . ammonium lactate (LAC-HYDRIN) 12 % lotion Apply 1 application topically in the morning and at bedtime. (Patient not taking: Reported on 04/15/2020) 400 g 2  . ciclopirox (PENLAC) 8 % solution Apply topically at bedtime. Apply over nail and surrounding skin. Apply daily over previous coat. After seven (7) days, may remove with alcohol and continue cycle. (Patient not taking: Reported on 04/15/2020) 6.6 mL 3  . ferrous sulfate 325 (65 FE)  MG tablet TAKE 1 TABLET BY MOUTH EVERY DAY (Patient not taking: Reported on 04/15/2020) 30 tablet 2  . fluconazole (DIFLUCAN) 150 MG tablet Take 1 tablet by mouth today. May repeat in 3 days if symptoms do not improve. (Patient not taking: Reported on 04/15/2020) 2 tablet 0  . hydrocortisone (ANUSOL-HC) 25 MG suppository Place 1 suppository (25 mg total) rectally 2 (two) times daily. (Patient not taking: Reported on 04/15/2020) 12 suppository 0  . pramoxine (PROCTOFOAM) 1 % foam Place 1 application rectally 3 (three) times daily as needed for anal itching. (Patient not taking: Reported on 04/15/2020) 15 g 0  . Vitamin D, Ergocalciferol, (DRISDOL) 1.25 MG (50000 UNIT) CAPS capsule TAKE 1 CAPSULE (50,000 UNITS TOTAL) BY MOUTH EVERY 7 (SEVEN) DAYS. (Patient not taking: Reported on 04/15/2020) 12 capsule 1   No current facility-administered medications on file prior to visit.   She is allergic to cetirizine & related..  Review of Systems Review of Systems  Constitutional: Negative for activity change, appetite change and fatigue.  HENT: Negative for hearing loss, congestion, tinnitus and ear discharge.  dentist q3m Eyes: Negative for visual disturbance (see optho q1y -- vision corrected to 20/20 with glasses).  Respiratory: Negative for cough, chest tightness and shortness of breath.   Cardiovascular: Negative for chest pain, palpitations and leg swelling.  Gastrointestinal: Negative for abdominal pain, diarrhea, constipation and abdominal distention.  Genitourinary: Negative for urgency, frequency, decreased urine volume and difficulty urinating.  Musculoskeletal: Negative for back pain, arthralgias and gait problem.  Skin: Negative for color change, pallor and rash.  Neurological: Negative for dizziness, light-headedness, numbness and headaches.  Hematological: Negative for adenopathy. Does not bruise/bleed easily.  Psychiatric/Behavioral: Negative for suicidal ideas, confusion, sleep disturbance,  self-injury, dysphoric mood, decreased concentration and agitation.       Objective:    BP 110/70 (BP Location: Right Arm, Patient Position: Sitting, Cuff Size: Normal)   Pulse 74   Temp 98.7 F (37.1 C) (Oral)  Resp 18   Ht 5\' 4"  (1.626 m)   Wt 160 lb 6.4 oz (72.8 kg)   LMP 03/21/2020   SpO2 99%   BMI 27.53 kg/m  General appearance: alert, cooperative, appears stated age and no distress Head: Normocephalic, without obvious abnormality, atraumatic Eyes: negative findings: lids and lashes normal, conjunctivae and sclerae normal and pupils equal, round, reactive to light and accomodation Ears: normal TM's and external ear canals both ears Neck: no adenopathy, no carotid bruit, no JVD, supple, symmetrical, trachea midline and thyroid not enlarged, symmetric, no tenderness/mass/nodules Back: symmetric, no curvature. ROM normal. No CVA tenderness. Lungs: clear to auscultation bilaterally Breasts: normal appearance, no masses or tenderness Heart: regular rate and rhythm, S1, S2 normal, no murmur, click, rub or gallop Abdomen: soft, non-tender; bowel sounds normal; no masses,  no organomegaly Pelvic: deferred Extremities: extremities normal, atraumatic, no cyanosis or edema Pulses: 2+ and symmetric Skin: Skin color, texture, turgor normal. No rashes or lesions Lymph nodes: Cervical, supraclavicular, and axillary nodes normal. Neurologic: Alert and oriented X 3, normal strength and tone. Normal symmetric reflexes. Normal coordination and gait    Assessment:    Healthy female exam.      Plan:    ghm utd Check labs  See After Visit Summary for Counseling Recommendations    1. Hair loss Refer to derm  - Ambulatory referral to Dermatology - Ciclopirox 1 % shampoo; Massage into scalp and let sit 5 min , rinse  Dispense: 120 mL; Refill: 0 - Vitamin B12  2. Scalp pain ? etiology - Ambulatory referral to Dermatology - Ciclopirox 1 % shampoo; Massage into scalp and let sit 5  min , rinse  Dispense: 120 mL; Refill: 0  3. Preventative health care See above  - Lipid panel - CBC with Differential/Platelet - Comprehensive metabolic panel - TSH  4. Need for hepatitis C screening test   - Hepatitis C antibody  5. Vitamin D deficiency   - Vitamin D (25 hydroxy)  6. Epigastric pain   - famotidine (PEPCID) 20 MG tablet; Take 1 tablet (20 mg total) by mouth 2 (two) times daily.  Dispense: 60 tablet; Refill: 5  7. PND (post-nasal drip)   - azelastine (ASTELIN) 0.1 % nasal spray; Place 1 spray into both nostrils 2 (two) times daily. Use in each nostril as directed  Dispense: 30 mL; Refill: 12

## 2020-04-15 NOTE — Patient Instructions (Signed)
Preventive Care 84-45 Years Old, Female Preventive care refers to lifestyle choices and visits with your health care provider that can promote health and wellness. This includes:  A yearly physical exam. This is also called an annual wellness visit.  Regular dental and eye exams.  Immunizations.  Screening for certain conditions.  Healthy lifestyle choices, such as: ? Eating a healthy diet. ? Getting regular exercise. ? Not using drugs or products that contain nicotine and tobacco. ? Limiting alcohol use. What can I expect for my preventive care visit? Physical exam Your health care provider will check your:  Height and weight. These may be used to calculate your BMI (body mass index). BMI is a measurement that tells if you are at a healthy weight.  Heart rate and blood pressure.  Body temperature.  Skin for abnormal spots. Counseling Your health care provider may ask you questions about your:  Past medical problems.  Family's medical history.  Alcohol, tobacco, and drug use.  Emotional well-being.  Home life and relationship well-being.  Sexual activity.  Diet, exercise, and sleep habits.  Work and work Statistician.  Access to firearms.  Method of birth control.  Menstrual cycle.  Pregnancy history. What immunizations do I need? Vaccines are usually given at various ages, according to a schedule. Your health care provider will recommend vaccines for you based on your age, medical history, and lifestyle or other factors, such as travel or where you work.   What tests do I need? Blood tests  Lipid and cholesterol levels. These may be checked every 5 years, or more often if you are over 3 years old.  Hepatitis C test.  Hepatitis B test. Screening  Lung cancer screening. You may have this screening every year starting at age 73 if you have a 30-pack-year history of smoking and currently smoke or have quit within the past 15 years.  Colorectal cancer  screening. ? All adults should have this screening starting at age 52 and continuing until age 17. ? Your health care provider may recommend screening at age 49 if you are at increased risk. ? You will have tests every 1-10 years, depending on your results and the type of screening test.  Diabetes screening. ? This is done by checking your blood sugar (glucose) after you have not eaten for a while (fasting). ? You may have this done every 1-3 years.  Mammogram. ? This may be done every 1-2 years. ? Talk with your health care provider about when you should start having regular mammograms. This may depend on whether you have a family history of breast cancer.  BRCA-related cancer screening. This may be done if you have a family history of breast, ovarian, tubal, or peritoneal cancers.  Pelvic exam and Pap test. ? This may be done every 3 years starting at age 10. ? Starting at age 11, this may be done every 5 years if you have a Pap test in combination with an HPV test. Other tests  STD (sexually transmitted disease) testing, if you are at risk.  Bone density scan. This is done to screen for osteoporosis. You may have this scan if you are at high risk for osteoporosis. Talk with your health care provider about your test results, treatment options, and if necessary, the need for more tests. Follow these instructions at home: Eating and drinking  Eat a diet that includes fresh fruits and vegetables, whole grains, lean protein, and low-fat dairy products.  Take vitamin and mineral supplements  as recommended by your health care provider.  Do not drink alcohol if: ? Your health care provider tells you not to drink. ? You are pregnant, may be pregnant, or are planning to become pregnant.  If you drink alcohol: ? Limit how much you have to 0-1 drink a day. ? Be aware of how much alcohol is in your drink. In the [place] one drink equals one 12 oz bottle of beer (355 mL), one 5 oz glass of  wine (148 mL), or one 1 oz glass of hard liquor (44 mL).   Lifestyle  Take daily care of your teeth and gums. Brush your teeth every morning and night with fluoride toothpaste. Floss one time each day.  Stay active. Exercise for at least 30 minutes 5 or more days each week.  Do not use any products that contain nicotine or tobacco, such as cigarettes, e-cigarettes, and chewing tobacco. If you need help quitting, ask your health care provider.  Do not use drugs.  If you are sexually active, practice safe sex. Use a condom or other form of protection to prevent STIs (sexually transmitted infections).  If you do not wish to become pregnant, use a form of birth control. If you plan to become pregnant, see your health care provider for a prepregnancy visit.  If told by your health care provider, take low-dose aspirin daily starting at age 50.  Find healthy ways to cope with stress, such as: ? Meditation, yoga, or listening to music. ? Journaling. ? Talking to a trusted person. ? Spending time with friends and family. Safety  Always wear your seat belt while driving or riding in a vehicle.  Do not drive: ? If you have been drinking alcohol. Do not ride with someone who has been drinking. ? When you are tired or distracted. ? While texting.  Wear a helmet and other protective equipment during sports activities.  If you have firearms in your house, make sure you follow all gun safety procedures. What's next?  Visit your health care provider once a year for an annual wellness visit.  Ask your health care provider how often you should have your eyes and teeth checked.  Stay up to date on all vaccines. This information is not intended to replace advice given to you by your health care provider. Make sure you discuss any questions you have with your health care provider. Document Revised: 11/03/2019 Document Reviewed: 10/10/2017 Elsevier Patient Education  2021 Elsevier Inc.  

## 2020-04-18 ENCOUNTER — Encounter: Payer: Self-pay | Admitting: Family Medicine

## 2020-04-18 ENCOUNTER — Other Ambulatory Visit: Payer: Self-pay

## 2020-04-18 DIAGNOSIS — R0982 Postnasal drip: Secondary | ICD-10-CM

## 2020-04-18 DIAGNOSIS — E559 Vitamin D deficiency, unspecified: Secondary | ICD-10-CM

## 2020-04-18 MED ORDER — FLUTICASONE PROPIONATE 50 MCG/ACT NA SUSP: 2.0000 | NASAL | 5 refills | Status: DC | PRN

## 2020-04-18 MED ORDER — AZELASTINE HCL 0.1 % NA SOLN: 1.0000 | Freq: Two times a day (BID) | NASAL | 12 refills | Status: DC

## 2020-04-18 MED ORDER — VITAMIN D (ERGOCALCIFEROL) 1.25 MG (50000 UNIT) PO CAPS: 50000.0000 [IU] | ORAL_CAPSULE | ORAL | 1 refills | Status: DC

## 2020-05-04 DIAGNOSIS — L218 Other seborrheic dermatitis: Secondary | ICD-10-CM | POA: Diagnosis not present

## 2020-05-04 DIAGNOSIS — L819 Disorder of pigmentation, unspecified: Secondary | ICD-10-CM | POA: Diagnosis not present

## 2020-05-04 DIAGNOSIS — L648 Other androgenic alopecia: Secondary | ICD-10-CM | POA: Diagnosis not present

## 2020-06-28 ENCOUNTER — Other Ambulatory Visit (HOSPITAL_BASED_OUTPATIENT_CLINIC_OR_DEPARTMENT_OTHER): Payer: Self-pay | Admitting: Family Medicine

## 2020-06-28 DIAGNOSIS — Z1231 Encounter for screening mammogram for malignant neoplasm of breast: Secondary | ICD-10-CM

## 2020-07-05 ENCOUNTER — Other Ambulatory Visit: Payer: Self-pay

## 2020-07-05 ENCOUNTER — Encounter (HOSPITAL_BASED_OUTPATIENT_CLINIC_OR_DEPARTMENT_OTHER): Payer: Self-pay

## 2020-07-05 ENCOUNTER — Ambulatory Visit (HOSPITAL_BASED_OUTPATIENT_CLINIC_OR_DEPARTMENT_OTHER)
Admission: RE | Admit: 2020-07-05 | Discharge: 2020-07-05 | Disposition: A | Discharge status: Home / Self Care | Payer: BC Managed Care – PPO | Source: Ambulatory Visit | Attending: Family Medicine | Admitting: Family Medicine

## 2020-07-05 DIAGNOSIS — Z1231 Encounter for screening mammogram for malignant neoplasm of breast: Secondary | ICD-10-CM | POA: Insufficient documentation

## 2020-09-15 ENCOUNTER — Other Ambulatory Visit: Payer: Self-pay | Admitting: Cardiovascular Disease

## 2020-09-15 NOTE — Telephone Encounter (Signed)
Declined to refill propranolol. Patient has not been seen since 2020, and has since seen Dr. Regina Eck cardiologist at [facility]. Per Dr. Catheryn Bacon office note on 09/18/19, recommended patient to d/c propranolol.

## 2020-09-20 ENCOUNTER — Ambulatory Visit: Payer: BC Managed Care – PPO | Admitting: Family Medicine

## 2020-09-20 ENCOUNTER — Other Ambulatory Visit: Payer: Self-pay

## 2020-09-20 ENCOUNTER — Encounter: Payer: Self-pay | Admitting: Family Medicine

## 2020-09-20 VITALS — BP 120/88 | HR 104 | Temp 98.6°F | Resp 18 | Ht 64.0 in | Wt 158.6 lb

## 2020-09-20 DIAGNOSIS — H6691 Otitis media, unspecified, right ear: Secondary | ICD-10-CM

## 2020-09-20 DIAGNOSIS — K047 Periapical abscess without sinus: Secondary | ICD-10-CM | POA: Diagnosis not present

## 2020-09-20 MED ORDER — CEPHALEXIN 500 MG PO CAPS
500.0000 mg | ORAL_CAPSULE | Freq: Two times a day (BID) | ORAL | 0 refills | Status: DC
Start: 2020-09-20 — End: 2020-11-29

## 2020-09-20 NOTE — Progress Notes (Signed)
Subjective:   By signing my name below, I, Shehryar Baig, attest that this documentation has been prepared under the direction and in the presence of Dr. Roma Schanz, DO. 09/20/2020    Patient ID: Rachel Wiley, female    DOB: 13-Jun-1975, 45 y.o.   MRN: [id number]  Chief Complaint  Patient presents with   Ear Pain    Pt states sxs started last week, pt states having ear pressure and states having dizziness.     HPI Patient is in today for a office visit. She complains of developing pressure in her right ear for the past week. She soon developed dizziness and gum pain. She notes when having episodes of dizziness her right ear felt hot to touch. She has seen a dentist a couple of days ago for the gum pain and found she had to have her wisdom tooth removed due to infection. Her dentist did not want to give any antibiotics until she has the procedure to remove the tooth.  She also notes having some post-nasal drip. She continues using Astelin to manage her pressure. She does not use Flonase to manage her symptoms. She reports she is allergic to cetirizine. She has taken benadryl as a child but has not taken it since then.  She reports having Covid-19 during the second week of July and having mild symptoms.    Past Medical History:  Diagnosis Date   Anxiety    Headache(784.0)    Lipoma of unspecified site    Other acute reactions to stress    Palpitations    SVD (spontaneous vaginal delivery) 06/18/2011   Tachycardia     Past Surgical History:  Procedure Laterality Date   NO PAST SURGERIES      Family History  Problem Relation Age of Onset   Hypertension Mother    Hypertension Other    Anesthesia problems Neg Hx    Hypotension Neg Hx    Malignant hyperthermia Neg Hx    Pseudochol deficiency Neg Hx     Social History   Socioeconomic History   Marital status: Married    Spouse name: Not on file   Number of children: Not on file   Years of education: Not on file    Highest education level: Not on file  Occupational History    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  Tobacco Use   Smoking status: Never   Smokeless tobacco: Never  Substance and Sexual Activity   Alcohol use: No   Drug use: No   Sexual activity: Yes  Other Topics Concern   Not on file  Social History Narrative   Never Smoked   Alcohol use-no   Drug use-no   Smoking Status:  never   Drug Use:  No   Exercise--- walking            Social Determinants of Health   Financial Resource Strain: Not on file  Food Insecurity: Not on file  Transportation Needs: Not on file  Physical Activity: Not on file  Stress: Not on file  Social Connections: Not on file  Intimate Partner Violence: Not on file    Outpatient Medications Prior to Visit  Medication Sig Dispense Refill   azelastine (ASTELIN) 0.1 % nasal spray Place 1 spray into both nostrils 2 (two) times daily. Use in each nostril as directed 30 mL 12   Ciclopirox 1 % shampoo Massage into scalp and let sit 5 min , rinse 120 mL 0   famotidine (PEPCID) 20  MG tablet Take 1 tablet (20 mg total) by mouth 2 (two) times daily. 60 tablet 5   fluticasone (FLONASE) 50 MCG/ACT nasal spray Place 2 sprays into both nostrils as needed for allergies or rhinitis. 16 g 5   propranolol (INDERAL) 20 MG tablet Take 0.5 tablets (10 mg total) by mouth 2 (two) times daily. 90 tablet 1   Vitamin D, Ergocalciferol, (DRISDOL) 1.25 MG (50000 UNIT) CAPS capsule Take 1 capsule (50,000 Units total) by mouth every 7 (seven) days. 12 capsule 1   No facility-administered medications prior to visit.    Allergies  Allergen Reactions   Cetirizine & Related Palpitations    Per pt    Review of Systems  Constitutional:  Negative for fever and malaise/fatigue.  HENT:  Positive for ear pain (right ear pressure). Negative for congestion.   Eyes:  Negative for blurred vision.  Respiratory:  Negative for shortness of breath.   Cardiovascular:  Negative for chest  pain, palpitations and leg swelling.  Gastrointestinal:  Negative for abdominal pain, blood in stool and nausea.  Genitourinary:  Negative for dysuria and frequency.  Musculoskeletal:  Negative for falls.  Skin:  Negative for rash.  Neurological:  Positive for dizziness (occasional). Negative for loss of consciousness and headaches.  Endo/Heme/Allergies:  Negative for environmental allergies.  Psychiatric/Behavioral:  Negative for depression. The patient is not nervous/anxious.       Objective:    Physical Exam Vitals and nursing note reviewed.  Constitutional:      General: She is not in acute distress.    Appearance: Normal appearance. She is not ill-appearing.  HENT:     Head: Normocephalic and atraumatic.     Right Ear: External ear normal. Decreased hearing noted. Tenderness present. A middle ear effusion is present. There is no impacted cerumen. No foreign body. No mastoid tenderness. Tympanic membrane is not injected, scarred, perforated, erythematous, retracted or bulging.     Left Ear: Hearing, tympanic membrane, ear canal and external ear normal. No decreased hearing noted. No laceration, drainage, swelling or tenderness.  No middle ear effusion. There is no impacted cerumen. No foreign body. No mastoid tenderness. No PE tube. No hemotympanum. Tympanic membrane is not injected, scarred, perforated, erythematous, retracted or bulging. Tympanic membrane has normal mobility.  Eyes:     Extraocular Movements: Extraocular movements intact.     Pupils: Pupils are equal, round, and reactive to light.  Cardiovascular:     Rate and Rhythm: Normal rate and regular rhythm.     Heart sounds: Normal heart sounds. No murmur heard.   No gallop.  Pulmonary:     Effort: Pulmonary effort is normal. No respiratory distress.     Breath sounds: Normal breath sounds. No wheezing or rales.  Skin:    General: Skin is warm and dry.  Neurological:     Mental Status: She is alert and oriented to  person, place, and time.  Psychiatric:        Behavior: Behavior normal.        Judgment: Judgment normal.    BP 120/88 (BP Location: Left Arm, Patient Position: Sitting, Cuff Size: Normal)   Pulse (!) 104   Temp 98.6 F (37 C) (Oral)   Resp 18   Ht '5\' 4"'$  (1.626 m)   Wt 158 lb 9.6 oz (71.9 kg)   SpO2 98%   BMI 27.22 kg/m  Wt Readings from Last 3 Encounters:  09/20/20 158 lb 9.6 oz (71.9 kg)  04/15/20 160 lb 6.4  oz (72.8 kg)  12/16/19 153 lb (69.4 kg)    Diabetic Foot Exam - Simple   No data filed    Lab Results  Component Value Date   WBC 6.6 04/15/2020   HGB 11.9 (L) 04/15/2020   HCT 36.4 04/15/2020   PLT 175.0 04/15/2020   GLUCOSE 91 04/15/2020   CHOL 150 04/15/2020   TRIG 105.0 04/15/2020   HDL 37.80 (L) 04/15/2020   LDLCALC 91 04/15/2020   ALT 10 04/15/2020   AST 11 04/15/2020   NA 139 04/15/2020   K 4.5 04/15/2020   CL 106 04/15/2020   CREATININE 0.80 04/15/2020   BUN 10 04/15/2020   CO2 27 04/15/2020   TSH 0.99 04/15/2020   HGBA1C 5.3 03/03/2015    Lab Results  Component Value Date   TSH 0.99 04/15/2020   Lab Results  Component Value Date   WBC 6.6 04/15/2020   HGB 11.9 (L) 04/15/2020   HCT 36.4 04/15/2020   MCV 81.9 04/15/2020   PLT 175.0 04/15/2020   Lab Results  Component Value Date   NA 139 04/15/2020   K 4.5 04/15/2020   CO2 27 04/15/2020   GLUCOSE 91 04/15/2020   BUN 10 04/15/2020   CREATININE 0.80 04/15/2020   BILITOT 0.4 04/15/2020   ALKPHOS 52 04/15/2020   AST 11 04/15/2020   ALT 10 04/15/2020   PROT 6.7 04/15/2020   ALBUMIN 3.8 04/15/2020   CALCIUM 8.7 04/15/2020   GFR 89.67 04/15/2020   Lab Results  Component Value Date   CHOL 150 04/15/2020   Lab Results  Component Value Date   HDL 37.80 (L) 04/15/2020   Lab Results  Component Value Date   LDLCALC 91 04/15/2020   Lab Results  Component Value Date   TRIG 105.0 04/15/2020   Lab Results  Component Value Date   CHOLHDL 4 04/15/2020   Lab Results   Component Value Date   HGBA1C 5.3 03/03/2015       Assessment & Plan:   Problem List Items Addressed This Visit       Unprioritized   Infected tooth    abx per orders F/u dentist        Relevant Medications   cephALEXin (KEFLEX) 500 MG capsule   Otitis of right ear - Primary    abx per orders  con't steroid nasal spay con't antihistamine        Relevant Medications   cephALEXin (KEFLEX) 500 MG capsule     Meds ordered this encounter  Medications   cephALEXin (KEFLEX) 500 MG capsule    Sig: Take 1 capsule (500 mg total) by mouth 2 (two) times daily.    Dispense:  20 capsule    Refill:  0    I, Dr. Roma Schanz, DO, personally preformed the services described in this documentation.  All medical record entries made by the scribe were at my direction and in my presence.  I have reviewed the chart and discharge instructions (if applicable) and agree that the record reflects my personal performance and is accurate and complete. 09/20/2020   I,Shehryar Baig,acting as a scribe for [name], DO.,have documented all relevant documentation on the behalf of [name], DO,as directed by  [name], DO while in the presence of [name], DO.   [name], DO

## 2020-09-20 NOTE — Assessment & Plan Note (Signed)
abx per orders F/u dentist  

## 2020-09-20 NOTE — Assessment & Plan Note (Signed)
abx per orders  con't steroid nasal spay con't antihistamine

## 2020-09-20 NOTE — Patient Instructions (Signed)
Otitis Externa  Otitis externa is an infection of the outer ear canal. The outer ear canal is the area between the outside of the ear and the eardrum. Otitis externa issometimes called swimmer's ear. What are the causes? Common causes of this condition include: Swimming in dirty water. Moisture in the ear. An injury to the inside of the ear. An object stuck in the ear. A cut or scrape on the outside of the ear. What increases the risk? You are more likely to develop this condition if you go swimming often. What are the signs or symptoms? The first symptom of this condition is often itching in the ear. Later symptoms of the condition include: Swelling of the ear. Redness in the ear. Ear pain. The pain may get worse when you pull on your ear. Pus coming from the ear. How is this diagnosed? This condition may be diagnosed by examining the ear and testing fluid from theear for bacteria and funguses. How is this treated? This condition may be treated with: Antibiotic ear drops. These are often given for 10-14 days. Medicines to reduce itching and swelling. Follow these instructions at home: If you were prescribed antibiotic ear drops, use them as told by your health care provider. Do not stop using the antibiotic even if your condition improves. Take over-the-counter and prescription medicines only as told by your health care provider. Avoid getting water in your ears as told by your health care provider. This may include avoiding swimming or water sports for a few days. Keep all follow-up visits as told by your health care provider. This is important. How is this prevented? Keep your ears dry. Use the corner of a towel to dry your ears after you swim or bathe. Avoid scratching or putting things in your ear. Doing these things can damage the ear canal or remove the protective wax that lines it, which makes it easier for bacteria and funguses to grow. Avoid swimming in lakes, polluted  water, or pools that may not have enough chlorine. Contact a health care provider if: You have a fever. Your ear is still red, swollen, painful, or draining pus after 3 days. Your redness, swelling, or pain gets worse. You have a severe headache. You have redness, swelling, pain, or tenderness in the area behind your ear. Summary Otitis externa is an infection of the outer ear canal. Common causes include swimming in dirty water, moisture in the ear, or a cut or scrape in the ear. Symptoms include pain, redness, and swelling of the ear. If you were prescribed antibiotic ear drops, use them as told by your health care provider. Do not stop using the antibiotic even if your condition improves. This information is not intended to replace advice given to you by your health care provider. Make sure you discuss any questions you have with your healthcare provider. Document Revised: 07/04/2017 Document Reviewed: 07/05/2017 Elsevier Patient Education  [employer].

## 2020-11-03 DIAGNOSIS — R42 Dizziness and giddiness: Secondary | ICD-10-CM | POA: Diagnosis not present

## 2020-11-03 DIAGNOSIS — H6983 Other specified disorders of Eustachian tube, bilateral: Secondary | ICD-10-CM | POA: Diagnosis not present

## 2020-11-29 ENCOUNTER — Encounter: Payer: Self-pay | Admitting: Family Medicine

## 2020-11-29 ENCOUNTER — Other Ambulatory Visit: Payer: Self-pay

## 2020-11-29 ENCOUNTER — Ambulatory Visit (INDEPENDENT_AMBULATORY_CARE_PROVIDER_SITE_OTHER): Payer: BC Managed Care – PPO | Admitting: Family Medicine

## 2020-11-29 VITALS — BP 140/100 | HR 110 | Temp 101.5°F | Resp 16 | Ht 64.0 in | Wt 158.6 lb

## 2020-11-29 DIAGNOSIS — J029 Acute pharyngitis, unspecified: Secondary | ICD-10-CM

## 2020-11-29 DIAGNOSIS — R509 Fever, unspecified: Secondary | ICD-10-CM | POA: Diagnosis not present

## 2020-11-29 DIAGNOSIS — Z20828 Contact with and (suspected) exposure to other viral communicable diseases: Secondary | ICD-10-CM

## 2020-11-29 DIAGNOSIS — Z20822 Contact with and (suspected) exposure to covid-19: Secondary | ICD-10-CM | POA: Diagnosis not present

## 2020-11-29 LAB — POCT INFLUENZA A/B
Influenza A, POC: NEGATIVE
Influenza B, POC: NEGATIVE

## 2020-11-29 MED ORDER — OSELTAMIVIR PHOSPHATE 75 MG PO CAPS
75.0000 mg | ORAL_CAPSULE | Freq: Every day | ORAL | 0 refills | Status: DC
Start: 2020-11-29 — End: 2021-01-30

## 2020-11-29 MED ORDER — IBUPROFEN 200 MG PO TABS
200.0000 mg | ORAL_TABLET | Freq: Once | ORAL | Status: AC
  Administered 2020-11-29: 400 mg via ORAL

## 2020-11-29 MED ORDER — AMOXICILLIN 875 MG PO TABS: 875.0000 mg | ORAL_TABLET | Freq: Two times a day (BID) | ORAL | 0 refills | Status: AC

## 2020-11-29 NOTE — Patient Instructions (Signed)
Influenza, Adult °Influenza, also called "the flu," is a viral infection that mainly affects the respiratory tract. This includes the lungs, nose, and throat. The flu spreads easily from person to person (is contagious). It causes common cold symptoms, along with high fever and body aches. °What are the causes? °This condition is caused by the influenza virus. You can get the virus by: °Breathing in droplets that are in the air from an infected person's cough or sneeze. °Touching something that has the virus on it (has been contaminated) and then touching your mouth, nose, or eyes. °What increases the risk? °The following factors may make you more likely to get the flu: °Not washing or sanitizing your hands often. °Having close contact with many people during cold and flu season. °Touching your mouth, eyes, or nose without first washing or sanitizing your hands. °Not getting an annual flu shot. °You may have a higher risk for the flu, including serious problems, such as a lung infection (pneumonia), if you: °Are older than 65. °Are pregnant. °Have a weakened disease-fighting system (immune system). This includes people who have HIV or AIDS, are on chemotherapy, or are taking medicines that reduce (suppress) the immune system. °Have a long-term (chronic) illness, such as heart disease, kidney disease, diabetes, or lung disease. °Have a liver disorder. °Are severely overweight (morbidly obese). °Have anemia. °Have asthma. °What are the signs or symptoms? °Symptoms of this condition usually begin suddenly and last 4-14 days. These may include: °Fever and chills. °Headaches, body aches, or muscle aches. °Sore throat. °Cough. °Runny or stuffy (congested) nose. °Chest discomfort. °Poor appetite. °Weakness or fatigue. °Dizziness. °Nausea or vomiting. °How is this diagnosed? °This condition may be diagnosed based on: °Your symptoms and medical history. °A physical exam. °Swabbing your nose or throat and testing the fluid  for the influenza virus. °How is this treated? °If the flu is diagnosed early, you can be treated with antiviral medicine that is given by mouth (orally) or through an IV. This can help reduce how severe the illness is and how long it lasts. °Taking care of yourself at home can help relieve symptoms. Your health care provider may recommend: °Taking over-the-counter medicines. °Drinking plenty of fluids. °In many cases, the flu goes away on its own. If you have severe symptoms or complications, you may be treated in a hospital. °Follow these instructions at home: °Activity °Rest as needed and get plenty of sleep. °Stay home from work or school as told by your health care provider. Unless you are visiting your health care provider, avoid leaving home until your fever has been gone for 24 hours without taking medicine. °Eating and drinking °Take an oral rehydration solution (ORS). This is a drink that is sold at pharmacies and retail stores. °Drink enough fluid to keep your urine pale yellow. °Drink clear fluids in small amounts as you are able. Clear fluids include water, ice chips, fruit juice mixed with water, and low-calorie sports drinks. °Eat bland, easy-to-digest foods in small amounts as you are able. These foods include bananas, applesauce, rice, lean meats, toast, and crackers. °Avoid drinking fluids that contain a lot of sugar or caffeine, such as energy drinks, regular sports drinks, and soda. °Avoid alcohol. °Avoid spicy or fatty foods. °General instructions °  °Take over-the-counter and prescription medicines only as told by your health care provider. °Use a cool mist humidifier to add humidity to the air in your home. This can make it easier to breathe. °When using a cool mist humidifier,   clean it daily. Empty the water and replace it with clean water. °Cover your mouth and nose when you cough or sneeze. °Wash your hands with soap and water often and for at least 20 seconds, especially after you cough or  sneeze. If soap and water are not available, use alcohol-based hand sanitizer. °Keep all follow-up visits. This is important. °How is this prevented? ° °Get an annual flu shot. This is usually available in late summer, fall, or winter. Ask your health care provider when you should get your flu shot. °Avoid contact with people who are sick during cold and flu season. This is generally fall and winter. °Contact a health care provider if: °You develop new symptoms. °You have: °Chest pain. °Diarrhea. °A fever. °Your cough gets worse. °You produce more mucus. °You feel nauseous or you vomit. °Get help right away if you: °Develop shortness of breath or have difficulty breathing. °Have skin or nails that turn a bluish color. °Have severe pain or stiffness in your neck. °Develop a sudden headache or sudden pain in your face or ear. °Cannot eat or drink without vomiting. °These symptoms may represent a serious problem that is an emergency. Do not wait to see if the symptoms will go away. Get medical help right away. Call your local emergency services (911 in the [place] Do not drive yourself to the hospital. °Summary °Influenza, also called "the flu," is a viral infection that primarily affects your respiratory tract. °Symptoms of the flu usually begin suddenly and last 4-14 days. °Getting an annual flu shot is the best way to prevent getting the flu. °Stay home from work or school as told by your health care provider. Unless you are visiting your health care provider, avoid leaving home until your fever has been gone for 24 hours without taking medicine. °Keep all follow-up visits. This is important. °This information is not intended to replace advice given to you by your health care provider. Make sure you discuss any questions you have with your health care provider. °Document Revised: 09/18/2019 Document Reviewed: 09/18/2019 °Elsevier Patient Education © 2022 Elsevier Inc. ° °

## 2020-11-29 NOTE — Progress Notes (Signed)
Subjective:   By signing my name below, I, Shehryar Baig, attest that this documentation has been prepared under the direction and in the presence of Dr. Roma Schanz, DO. 11/29/2020    Patient ID: Rachel Wiley, female    DOB: 12-06-1975, 45 y.o.   MRN: [id number]  Chief Complaint  Patient presents with   Cough    Pt states sxs started last night. Pt exposed to COVID and flu. Pt states cough, sore throat, headaches, body aches Pt states bad smell from throat.     Cough Associated symptoms include a fever, headaches, myalgias (generalized) and a sore throat.  Patient is in today for a office visit.   She complains of cough, body aches, headache, congestion at this time since yesterday night. She has pain while swallowing. She is struggling to stand for long periods due to body aches. She has a fever of 101.5 degrees at this time. Her child had Covid-19 last week but has recovered. Her other child has the flu at home at this time. They are currently taking tamiflu to manage his symptoms.  She is taking OTC tylenol and last taken it at 8am this morning.    Past Medical History:  Diagnosis Date   Anxiety    Headache(784.0)    Lipoma of unspecified site    Other acute reactions to stress    Palpitations    SVD (spontaneous vaginal delivery) 06/18/2011   Tachycardia     Past Surgical History:  Procedure Laterality Date   NO PAST SURGERIES      Family History  Problem Relation Age of Onset   Hypertension Mother    Hypertension Other    Anesthesia problems Neg Hx    Hypotension Neg Hx    Malignant hyperthermia Neg Hx    Pseudochol deficiency Neg Hx     Social History   Socioeconomic History   Marital status: Married    Spouse name: Not on file   Number of children: Not on file   Years of education: Not on file   Highest education level: Not on file  Occupational History    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  Tobacco Use   Smoking status: Never   Smokeless  tobacco: Never  Substance and Sexual Activity   Alcohol use: No   Drug use: No   Sexual activity: Yes  Other Topics Concern   Not on file  Social History Narrative   Never Smoked   Alcohol use-no   Drug use-no   Smoking Status:  never   Drug Use:  No   Exercise--- walking            Social Determinants of Health   Financial Resource Strain: Not on file  Food Insecurity: Not on file  Transportation Needs: Not on file  Physical Activity: Not on file  Stress: Not on file  Social Connections: Not on file  Intimate Partner Violence: Not on file    Outpatient Medications Prior to Visit  Medication Sig Dispense Refill   azelastine (ASTELIN) 0.1 % nasal spray Place 1 spray into both nostrils 2 (two) times daily. Use in each nostril as directed 30 mL 12   Ciclopirox 1 % shampoo Massage into scalp and let sit 5 min , rinse 120 mL 0   famotidine (PEPCID) 20 MG tablet Take 1 tablet (20 mg total) by mouth 2 (two) times daily. 60 tablet 5   fluticasone (FLONASE) 50 MCG/ACT nasal spray Place 2 sprays into both nostrils  as needed for allergies or rhinitis. 16 g 5   propranolol (INDERAL) 20 MG tablet Take 0.5 tablets (10 mg total) by mouth 2 (two) times daily. 90 tablet 1   Vitamin D, Ergocalciferol, (DRISDOL) 1.25 MG (50000 UNIT) CAPS capsule Take 1 capsule (50,000 Units total) by mouth every 7 (seven) days. 12 capsule 1   cephALEXin (KEFLEX) 500 MG capsule Take 1 capsule (500 mg total) by mouth 2 (two) times daily. (Patient not taking: Reported on 11/29/2020) 20 capsule 0   No facility-administered medications prior to visit.    Allergies  Allergen Reactions   Cetirizine & Related Palpitations    Per pt    Review of Systems  Constitutional:  Positive for fever.  HENT:  Positive for congestion and sore throat.   Respiratory:  Positive for cough.   Musculoskeletal:  Positive for myalgias (generalized).  Neurological:  Positive for headaches.      Objective:    Physical  Exam Constitutional:      General: She is not in acute distress.    Appearance: Normal appearance. She is not ill-appearing.  HENT:     Head: Normocephalic and atraumatic.     Right Ear: Tympanic membrane, ear canal and external ear normal.     Left Ear: Tympanic membrane, ear canal and external ear normal.     Mouth/Throat:     Mouth: Mucous membranes are moist.     Pharynx: Oropharynx is clear. Posterior oropharyngeal erythema present.  Eyes:     Extraocular Movements: Extraocular movements intact.     Pupils: Pupils are equal, round, and reactive to light.  Cardiovascular:     Rate and Rhythm: Normal rate and regular rhythm.     Heart sounds: Normal heart sounds. No murmur heard.   No gallop.  Pulmonary:     Effort: Pulmonary effort is normal. No respiratory distress.     Breath sounds: Normal breath sounds. No wheezing or rales.  Lymphadenopathy:     Cervical: Cervical adenopathy present.  Skin:    General: Skin is warm and dry.  Neurological:     Mental Status: She is alert and oriented to person, place, and time.  Psychiatric:        Behavior: Behavior normal.        Judgment: Judgment normal.    BP (!) 140/100 (BP Location: Left Arm, Patient Position: Sitting, Cuff Size: Normal)   Pulse (!) 110   Temp (!) 101.5 F (38.6 C) (Oral)   Resp 16   Ht 5\' 4"  (1.626 m)   Wt 158 lb 9.6 oz (71.9 kg)   SpO2 98%   BMI 27.22 kg/m  Wt Readings from Last 3 Encounters:  11/29/20 158 lb 9.6 oz (71.9 kg)  09/20/20 158 lb 9.6 oz (71.9 kg)  04/15/20 160 lb 6.4 oz (72.8 kg)    Diabetic Foot Exam - Simple   No data filed    Lab Results  Component Value Date   WBC 6.6 04/15/2020   HGB 11.9 (L) 04/15/2020   HCT 36.4 04/15/2020   PLT 175.0 04/15/2020   GLUCOSE 91 04/15/2020   CHOL 150 04/15/2020   TRIG 105.0 04/15/2020   HDL 37.80 (L) 04/15/2020   LDLCALC 91 04/15/2020   ALT 10 04/15/2020   AST 11 04/15/2020   NA 139 04/15/2020   K 4.5 04/15/2020   CL 106 04/15/2020    CREATININE 0.80 04/15/2020   BUN 10 04/15/2020   CO2 27 04/15/2020   TSH 0.99 04/15/2020  HGBA1C 5.3 03/03/2015    Lab Results  Component Value Date   TSH 0.99 04/15/2020   Lab Results  Component Value Date   WBC 6.6 04/15/2020   HGB 11.9 (L) 04/15/2020   HCT 36.4 04/15/2020   MCV 81.9 04/15/2020   PLT 175.0 04/15/2020   Lab Results  Component Value Date   NA 139 04/15/2020   K 4.5 04/15/2020   CO2 27 04/15/2020   GLUCOSE 91 04/15/2020   BUN 10 04/15/2020   CREATININE 0.80 04/15/2020   BILITOT 0.4 04/15/2020   ALKPHOS 52 04/15/2020   AST 11 04/15/2020   ALT 10 04/15/2020   PROT 6.7 04/15/2020   ALBUMIN 3.8 04/15/2020   CALCIUM 8.7 04/15/2020   GFR 89.67 04/15/2020   Lab Results  Component Value Date   CHOL 150 04/15/2020   Lab Results  Component Value Date   HDL 37.80 (L) 04/15/2020   Lab Results  Component Value Date   LDLCALC 91 04/15/2020   Lab Results  Component Value Date   TRIG 105.0 04/15/2020   Lab Results  Component Value Date   CHOLHDL 4 04/15/2020   Lab Results  Component Value Date   HGBA1C 5.3 03/03/2015       Assessment & Plan:   Problem List Items Addressed This Visit       Unprioritized   Exposure to the flu - Primary    tamiflu x 5 days Tylenol / IB for bodyaches and fever Rest -- drink plenty of fluids  rto prn       Relevant Medications   oseltamivir (TAMIFLU) 75 MG capsule   Other Relevant Orders   POCT Influenza A/B (Completed)   Pharyngitis    Amoxil x 10 days  Drink plenty of fluids       Relevant Medications   amoxicillin (AMOXIL) 875 MG tablet   Other Visit Diagnoses     Close exposure to COVID-19 virus       Relevant Orders   Novel Coronavirus, NAA ([facility])   Fever, unspecified fever cause       Relevant Medications   ibuprofen (ADVIL) tablet 200 mg (Completed)        Meds ordered this encounter  Medications   oseltamivir (TAMIFLU) 75 MG capsule    Sig: Take 1 capsule (75 mg total) by  mouth daily.    Dispense:  10 capsule    Refill:  0   amoxicillin (AMOXIL) 875 MG tablet    Sig: Take 1 tablet (875 mg total) by mouth 2 (two) times daily for 10 days.    Dispense:  20 tablet    Refill:  0   ibuprofen (ADVIL) tablet 200 mg    I, Dr. Roma Schanz, DO, personally preformed the services described in this documentation.  All medical record entries made by the scribe were at my direction and in my presence.  I have reviewed the chart and discharge instructions (if applicable) and agree that the record reflects my personal performance and is accurate and complete. 11/29/2020   I,Shehryar Baig,acting as a scribe for [name], DO.,have documented all relevant documentation on the behalf of [name], DO,as directed by  [name], DO while in the presence of [name], DO.   [name], DO

## 2020-11-30 DIAGNOSIS — Z20828 Contact with and (suspected) exposure to other viral communicable diseases: Secondary | ICD-10-CM | POA: Insufficient documentation

## 2020-11-30 DIAGNOSIS — J029 Acute pharyngitis, unspecified: Secondary | ICD-10-CM | POA: Insufficient documentation

## 2020-11-30 NOTE — Assessment & Plan Note (Signed)
Amoxil x 10 days  Drink plenty of fluids

## 2020-11-30 NOTE — Assessment & Plan Note (Signed)
tamiflu x 5 days Tylenol / IB for bodyaches and fever Rest -- drink plenty of fluids  rto prn

## 2020-12-01 LAB — SARS-COV-2, NAA 2 DAY TAT

## 2020-12-01 LAB — NOVEL CORONAVIRUS, NAA: SARS-CoV-2, NAA: NOT DETECTED

## 2021-01-30 ENCOUNTER — Encounter: Payer: Self-pay | Admitting: Family Medicine

## 2021-01-30 ENCOUNTER — Ambulatory Visit: Payer: BC Managed Care – PPO | Admitting: Family Medicine

## 2021-01-30 VITALS — BP 120/88 | HR 100 | Temp 98.7°F | Resp 18 | Ht 64.0 in | Wt 164.2 lb

## 2021-01-30 DIAGNOSIS — J029 Acute pharyngitis, unspecified: Secondary | ICD-10-CM

## 2021-01-30 LAB — POCT RAPID STREP A (OFFICE): Rapid Strep A Screen: NEGATIVE

## 2021-01-30 LAB — POC COVID19 BINAXNOW: SARS Coronavirus 2 Ag: NEGATIVE

## 2021-01-30 MED ORDER — AMOXICILLIN 875 MG PO TABS: 875.0000 mg | ORAL_TABLET | Freq: Two times a day (BID) | ORAL | 0 refills | Status: AC

## 2021-01-30 NOTE — Assessment & Plan Note (Signed)
Amoxicillin bid x 10 days  con't astelin and flonase rto prn

## 2021-01-30 NOTE — Progress Notes (Signed)
Established Patient Office Visit  Subjective:  Patient ID: Rachel Wiley, female    DOB: 1975/03/27  Age: 45 y.o. MRN: [id number]  CC:  Chief Complaint  Patient presents with   Sore Throat    Pt states sxs started last night. Pt states both ears are congested. Pt states hurts to swallow.     HPI Rachel Wiley presents for severe throat and congested and pnd.  Pt is taking flonase.  No fever.  No cough    no other sick contacts.     Past Medical History:  Diagnosis Date   Anxiety    Headache(784.0)    Lipoma of unspecified site    Other acute reactions to stress    Palpitations    SVD (spontaneous vaginal delivery) 06/18/2011   Tachycardia     Past Surgical History:  Procedure Laterality Date   NO PAST SURGERIES      Family History  Problem Relation Age of Onset   Hypertension Mother    Hypertension Other    Anesthesia problems Neg Hx    Hypotension Neg Hx    Malignant hyperthermia Neg Hx    Pseudochol deficiency Neg Hx     Social History   Socioeconomic History   Marital status: Married    Spouse name: Not on file   Number of children: Not on file   Years of education: Not on file   Highest education level: Not on file  Occupational History    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  Tobacco Use   Smoking status: Never   Smokeless tobacco: Never  Substance and Sexual Activity   Alcohol use: No   Drug use: No   Sexual activity: Yes  Other Topics Concern   Not on file  Social History Narrative   Never Smoked   Alcohol use-no   Drug use-no   Smoking Status:  never   Drug Use:  No   Exercise--- walking            Social Determinants of Health   Financial Resource Strain: Not on file  Food Insecurity: Not on file  Transportation Needs: Not on file  Physical Activity: Not on file  Stress: Not on file  Social Connections: Not on file  Intimate Partner Violence: Not on file    Outpatient Medications Prior to Visit  Medication Sig Dispense Refill    azelastine (ASTELIN) 0.1 % nasal spray Place 1 spray into both nostrils 2 (two) times daily. Use in each nostril as directed 30 mL 12   Ciclopirox 1 % shampoo Massage into scalp and let sit 5 min , rinse 120 mL 0   famotidine (PEPCID) 20 MG tablet Take 1 tablet (20 mg total) by mouth 2 (two) times daily. 60 tablet 5   fluticasone (FLONASE) 50 MCG/ACT nasal spray Place 2 sprays into both nostrils as needed for allergies or rhinitis. 16 g 5   propranolol (INDERAL) 20 MG tablet Take 0.5 tablets (10 mg total) by mouth 2 (two) times daily. 90 tablet 1   Vitamin D, Ergocalciferol, (DRISDOL) 1.25 MG (50000 UNIT) CAPS capsule Take 1 capsule (50,000 Units total) by mouth every 7 (seven) days. 12 capsule 1   oseltamivir (TAMIFLU) 75 MG capsule Take 1 capsule (75 mg total) by mouth daily. (Patient not taking: Reported on 01/30/2021) 10 capsule 0   No facility-administered medications prior to visit.    Allergies  Allergen Reactions   Cetirizine & Related Palpitations    Per pt  ROS Review of Systems  Constitutional:  Negative for appetite change, diaphoresis, fatigue and unexpected weight change.  HENT:  Positive for congestion, ear pain, postnasal drip and sore throat. Negative for sinus pressure, sinus pain and sneezing.   Eyes:  Negative for pain, redness and visual disturbance.  Respiratory:  Negative for cough, chest tightness, shortness of breath and wheezing.   Cardiovascular:  Negative for chest pain, palpitations and leg swelling.  Endocrine: Negative for cold intolerance, heat intolerance, polydipsia, polyphagia and polyuria.  Genitourinary:  Negative for difficulty urinating, dysuria and frequency.  Neurological:  Negative for dizziness, light-headedness, numbness and headaches.  Psychiatric/Behavioral:  Negative for decreased concentration, self-injury, sleep disturbance and suicidal ideas. The patient is not nervous/anxious.      Objective:    Physical Exam Vitals and nursing  note reviewed.  Constitutional:      Appearance: She is well-developed.  HENT:     Head: Normocephalic and atraumatic.     Nose: Congestion present.     Mouth/Throat:     Mouth: Mucous membranes are moist.     Pharynx: Pharyngeal swelling, oropharyngeal exudate and posterior oropharyngeal erythema present.     Tonsils: Tonsillar exudate present.  Eyes:     Conjunctiva/sclera: Conjunctivae normal.  Neck:     Thyroid: No thyromegaly.     Vascular: No carotid bruit or JVD.  Cardiovascular:     Rate and Rhythm: Normal rate and regular rhythm.     Heart sounds: Normal heart sounds. No murmur heard. Pulmonary:     Effort: Pulmonary effort is normal. No respiratory distress.     Breath sounds: Normal breath sounds. No wheezing or rales.  Chest:     Chest wall: No tenderness.  Musculoskeletal:     Cervical back: Normal range of motion and neck supple.  Lymphadenopathy:     Cervical: Cervical adenopathy present.  Neurological:     Mental Status: She is alert and oriented to person, place, and time.    BP 120/88 (BP Location: Left Arm, Patient Position: Sitting, Cuff Size: Normal)    Pulse 100    Temp 98.7 F (37.1 C) (Oral)    Resp 18    Ht 5\' 4"  (1.626 m)    Wt 164 lb 3.2 oz (74.5 kg)    SpO2 99%    BMI 28.18 kg/m  Wt Readings from Last 3 Encounters:  01/30/21 164 lb 3.2 oz (74.5 kg)  11/29/20 158 lb 9.6 oz (71.9 kg)  09/20/20 158 lb 9.6 oz (71.9 kg)     Health Maintenance Due  Topic Date Due   COVID-19 Vaccine (4 - Booster for Pfizer series) 01/30/2020   INFLUENZA VACCINE  09/12/2020   COLONOSCOPY (Pts 45-32yrs Insurance coverage will need to be confirmed)  Never done    There are no preventive care reminders to display for this patient.  Lab Results  Component Value Date   TSH 0.99 04/15/2020   Lab Results  Component Value Date   WBC 6.6 04/15/2020   HGB 11.9 (L) 04/15/2020   HCT 36.4 04/15/2020   MCV 81.9 04/15/2020   PLT 175.0 04/15/2020   Lab Results   Component Value Date   NA 139 04/15/2020   K 4.5 04/15/2020   CO2 27 04/15/2020   GLUCOSE 91 04/15/2020   BUN 10 04/15/2020   CREATININE 0.80 04/15/2020   BILITOT 0.4 04/15/2020   ALKPHOS 52 04/15/2020   AST 11 04/15/2020   ALT 10 04/15/2020   PROT 6.7 04/15/2020  ALBUMIN 3.8 04/15/2020   CALCIUM 8.7 04/15/2020   GFR 89.67 04/15/2020   Lab Results  Component Value Date   CHOL 150 04/15/2020   Lab Results  Component Value Date   HDL 37.80 (L) 04/15/2020   Lab Results  Component Value Date   LDLCALC 91 04/15/2020   Lab Results  Component Value Date   TRIG 105.0 04/15/2020   Lab Results  Component Value Date   CHOLHDL 4 04/15/2020   Lab Results  Component Value Date   HGBA1C 5.3 03/03/2015      Assessment & Plan:   Problem List Items Addressed This Visit       Unprioritized   Pharyngitis - Primary    Amoxicillin bid x 10 days  con't astelin and flonase rto prn       Relevant Medications   amoxicillin (AMOXIL) 875 MG tablet   Other Relevant Orders   POCT rapid strep A   POC COVID-19    Meds ordered this encounter  Medications   amoxicillin (AMOXIL) 875 MG tablet    Sig: Take 1 tablet (875 mg total) by mouth 2 (two) times daily for 10 days.    Dispense:  20 tablet    Refill:  0    Follow-up: Return if symptoms worsen or fail to improve.    [name], DO

## 2021-01-30 NOTE — Patient Instructions (Signed)
Pharyngitis °Pharyngitis is inflammation of the throat (pharynx). It is a very common cause of sore throat. Pharyngitis can be caused by a bacteria, but it is usually caused by a virus. Most cases of pharyngitis get better on their own without treatment. °What are the causes? °This condition may be caused by: °Infection by viruses (viral). Viral pharyngitis spreads easily from person to person (is contagious) through coughing, sneezing, and sharing of personal items or utensils such as cups, forks, spoons, and toothbrushes. °Infection by bacteria (bacterial). Bacterial pharyngitis may be spread by touching the nose or face after coming in contact with the bacteria, or through close contact, such as kissing. °Allergies. Allergies can cause buildup of mucus in the throat (post-nasal drip), leading to inflammation and irritation. Allergies can also cause blocked nasal passages, forcing breathing through the mouth, which dries and irritates the throat. °What increases the risk? °You are more likely to develop this condition if: °You are 5-24 years old. °You are exposed to crowded environments such as daycare, school, or dormitory living. °You live in a cold climate. °You have a weakened disease-fighting (immune) system. °What are the signs or symptoms? °Symptoms of this condition vary by the cause. Common symptoms of this condition include: °Sore throat. °Fatigue. °Low-grade fever. °Stuffy nose (nasal congestion) and cough. °Headache. °Other symptoms may include: °Glands in the neck (lymph nodes) that are swollen. °Skin rashes. °Plaque-like film on the throat or tonsils. This is often a symptom of bacterial pharyngitis. °Vomiting. °Red, itchy eyes (conjunctivitis). °Loss of appetite. °Joint pain and muscle aches. °Enlarged tonsils. °How is this diagnosed? °This condition may be diagnosed based on your medical history and a physical exam. Your health care provider will ask you questions about your illness and your  symptoms. °A swab of your throat may be done to check for bacteria (rapid strep test). Other lab tests may also be done, depending on the suspected cause, but these are rare. °How is this treated? °Many times, treatment is not needed for this condition. Pharyngitis usually gets better in 3-4 days without treatment. °Bacterial pharyngitis may be treated with antibiotic medicines. °Follow these instructions at home: °Medicines °Take over-the-counter and prescription medicines only as told by your health care provider. °If you were prescribed an antibiotic medicine, take it as told by your health care provider. Do not stop taking the antibiotic even if you start to feel better. °Use throat sprays to soothe your throat as told by your health care provider. °Children can get pharyngitis. Do not give your child aspirin because of the association with Reye's syndrome. °Managing pain °To help with pain, try: °Sipping warm liquids, such as broth, herbal tea, or warm water. °Eating or drinking cold or frozen liquids, such as frozen ice pops. °Gargling with a mixture of salt and water 3-4 times a day or as needed. To make salt water, completely dissolve ½-1 tsp (3-6 g) of salt in 1 cup (237 mL) of warm water. °Sucking on hard candy or throat lozenges. °Putting a cool-mist humidifier in your bedroom at night to moisten the air. °Sitting in the bathroom with the door closed for 5-10 minutes while you run hot water in the shower. ° °General instructions ° °Do not use any products that contain nicotine or tobacco. These products include cigarettes, chewing tobacco, and vaping devices, such as e-cigarettes. If you need help quitting, ask your health care provider. °Rest as told by your health care provider. °Drink enough fluid to keep your urine pale yellow. °How   is this prevented? °To help prevent becoming infected or spreading infection: °Wash your hands often with soap and water for at least 20 seconds. If soap and water are not  available, use hand sanitizer. °Do not touch your eyes, nose, or mouth with unwashed hands, and wash hands after touching these areas. °Do not share cups or eating utensils. °Avoid close contact with people who are sick. °Contact a health care provider if: °You have large, tender lumps in your neck. °You have a rash. °You cough up green, yellow-brown, or bloody mucus. °Get help right away if: °Your neck becomes stiff. °You drool or are unable to swallow liquids. °You cannot drink or take medicines without vomiting. °You have severe pain that does not go away, even after you take medicine. °You have trouble breathing, and it is not caused by a stuffy nose. °You have new pain and swelling in your joints such as the knees, ankles, wrists, or elbows. °These symptoms may represent a serious problem that is an emergency. Do not wait to see if the symptoms will go away. Get medical help right away. Call your local emergency services (911 in the [place] Do not drive yourself to the hospital. °Summary °Pharyngitis is redness, pain, and swelling (inflammation) of the throat (pharynx). °While pharyngitis can be caused by a bacteria, the most common causes are viral. °Most cases of pharyngitis get better on their own without treatment. °Bacterial pharyngitis is treated with antibiotic medicines. °This information is not intended to replace advice given to you by your health care provider. Make sure you discuss any questions you have with your health care provider. °Document Revised: 04/27/2020 Document Reviewed: 04/27/2020 °Elsevier Patient Education © 2022 [employer] ° °

## 2021-04-28 ENCOUNTER — Other Ambulatory Visit (HOSPITAL_COMMUNITY)
Admission: RE | Admit: 2021-04-28 | Discharge: 2021-04-28 | Disposition: A | Discharge status: Home / Self Care | Payer: BC Managed Care – PPO | Source: Ambulatory Visit | Attending: Family Medicine | Admitting: Family Medicine

## 2021-04-28 ENCOUNTER — Encounter: Payer: Self-pay | Admitting: Family Medicine

## 2021-04-28 ENCOUNTER — Ambulatory Visit (INDEPENDENT_AMBULATORY_CARE_PROVIDER_SITE_OTHER): Payer: BC Managed Care – PPO | Admitting: Family Medicine

## 2021-04-28 VITALS — BP 110/88 | HR 85 | Temp 98.0°F | Resp 18 | Ht 64.0 in | Wt 158.0 lb

## 2021-04-28 DIAGNOSIS — Z0000 Encounter for general adult medical examination without abnormal findings: Secondary | ICD-10-CM

## 2021-04-28 DIAGNOSIS — Z1211 Encounter for screening for malignant neoplasm of colon: Secondary | ICD-10-CM | POA: Diagnosis not present

## 2021-04-28 DIAGNOSIS — R0982 Postnasal drip: Secondary | ICD-10-CM | POA: Diagnosis not present

## 2021-04-28 DIAGNOSIS — S161XXA Strain of muscle, fascia and tendon at neck level, initial encounter: Secondary | ICD-10-CM | POA: Diagnosis not present

## 2021-04-28 DIAGNOSIS — N926 Irregular menstruation, unspecified: Secondary | ICD-10-CM | POA: Insufficient documentation

## 2021-04-28 DIAGNOSIS — N939 Abnormal uterine and vaginal bleeding, unspecified: Secondary | ICD-10-CM | POA: Diagnosis not present

## 2021-04-28 LAB — COMPREHENSIVE METABOLIC PANEL
ALT: 12 U/L (ref 0–35)
AST: 13 U/L (ref 0–37)
Albumin: 4.3 g/dL (ref 3.5–5.2)
Alkaline Phosphatase: 57 U/L (ref 39–117)
BUN: 12 mg/dL (ref 6–23)
CO2: 29 mEq/L (ref 19–32)
Calcium: 9 mg/dL (ref 8.4–10.5)
Chloride: 102 mEq/L (ref 96–112)
Creatinine, Ser: 0.91 mg/dL (ref 0.40–1.20)
GFR: 76.27 mL/min (ref >60.00)
Glucose, Bld: 88 mg/dL (ref 70–99)
Potassium: 4 mEq/L (ref 3.5–5.1)
Sodium: 138 mEq/L (ref 135–145)
Total Bilirubin: 0.5 mg/dL (ref 0.2–1.2)
Total Protein: 7.4 g/dL (ref 6.0–8.3)

## 2021-04-28 LAB — CBC WITH DIFFERENTIAL/PLATELET
Basophils Absolute: 0 10*3/uL (ref 0.0–0.1)
Basophils Relative: 0.5 % (ref 0.0–3.0)
Eosinophils Absolute: 0.3 10*3/uL (ref 0.0–0.7)
Eosinophils Relative: 4.5 % (ref 0.0–5.0)
HCT: 38.8 % (ref 36.0–46.0)
Hemoglobin: 12.7 g/dL (ref 12.0–15.0)
Lymphocytes Relative: 30.8 % (ref 12.0–46.0)
Lymphs Abs: 2.3 10*3/uL (ref 0.7–4.0)
MCHC: 32.8 g/dL (ref 30.0–36.0)
MCV: 84.2 fl (ref 78.0–100.0)
Monocytes Absolute: 0.6 10*3/uL (ref 0.1–1.0)
Monocytes Relative: 7.6 % (ref 3.0–12.0)
Neutro Abs: 4.2 10*3/uL (ref 1.4–7.7)
Neutrophils Relative %: 56.6 % (ref 43.0–77.0)
Platelets: 196 10*3/uL (ref 150.0–400.0)
RBC: 4.61 Mil/uL (ref 3.87–5.11)
RDW: 15.9 % — ABNORMAL HIGH (ref 11.5–15.5)
WBC: 7.4 10*3/uL (ref 4.0–10.5)

## 2021-04-28 LAB — LIPID PANEL
Cholesterol: 172 mg/dL (ref 0–200)
HDL: 41.7 mg/dL (ref >39.00)
LDL Cholesterol: 108 mg/dL — ABNORMAL HIGH (ref 0–99)
NonHDL: 129.92
Total CHOL/HDL Ratio: 4
Triglycerides: 109 mg/dL (ref 0.0–149.0)
VLDL: 21.8 mg/dL (ref 0.0–40.0)

## 2021-04-28 LAB — TSH: TSH: 1.35 u[IU]/mL (ref 0.35–5.50)

## 2021-04-28 MED ORDER — FLUTICASONE PROPIONATE 50 MCG/ACT NA SUSP: 2.0000 | NASAL | 5 refills | Status: DC | PRN

## 2021-04-28 MED ORDER — AZELASTINE HCL 0.1 % NA SOLN: 1.0000 | Freq: Two times a day (BID) | NASAL | 12 refills | Status: DC

## 2021-04-28 NOTE — Progress Notes (Signed)
? ?Subjective:  ? ?By signing my name below, I, Rachel Wiley, attest that this documentation has been prepared under the direction and in the presence of [name], DO. 04/28/2021 ? ? Patient ID: Rachel Wiley, female    DOB: 11-08-1975, 46 y.o.   MRN: [id number] ? ?Chief Complaint  ?Patient presents with  ? Annual Exam  ? ? ?HPI ?Patient is in today for a comprehensive physical exam. ? ?She reports that for the past 2 months during her menstrual cycle, there is brown discharge for 3-4 days followed with 3 days of blood. ? ?She had a visit in 10/2020 for dizziness and full feeling in her right ear. She was given antibiotics and went to see an ENT. The feeling was resolved. Recently, the feeling of fullness is now in the left ear and she gets occasional headaches and dizziness. The headaches are mild and last for about 2-3 days. She reports swelling and pain behind the left ear and it is stressing her out. There is also strain in the left side of her neck for the past few days.  ? ?She reports she has been getting high blood pressure readings recently. She is very stressed at home. ? ?She adds that sometimes, she gets a tingling feeling in her arms and legs. The feeling is not brought on by anything.  ? ?She has stones in the teeth in the left side of her jaw that she is supposed to have a surgery for. ? ?She uses 20 mg propanolol as needed when her blood pressure is high. She was told by a cardiologist at [facility] there was nothing wrong with her heart. She tries not to use it and does breathing exercises or takes a walk to calm down.   ? ?She denies fever, hearing loss,congestion, sinus pain, sore throat, eye pain, chest pain, palpitations, cough, shortness of breath, wheezing, nausea. vomiting, diarrhea, constipation, blood in stool, dysuria,frequency, hematuria.  ? ?She missed the flu vaccine. She has 3 Covid-19 vaccines at this time. ?Past Medical History:  ?Diagnosis Date  ? Anxiety   ? Headache(784.0)   ?  Lipoma of unspecified site   ? Other acute reactions to stress   ? Palpitations   ? SVD (spontaneous vaginal delivery) 06/18/2011  ? Tachycardia   ? ? ?Past Surgical History:  ?Procedure Laterality Date  ? NO PAST SURGERIES    ? ? ?Family History  ?Problem Relation Age of Onset  ? Hypertension Mother   ? Hypertension Other   ? Anesthesia problems Neg Hx   ? Hypotension Neg Hx   ? Malignant hyperthermia Neg Hx   ? Pseudochol deficiency Neg Hx   ? ? ?Social History  ? ?Socioeconomic History  ? Marital status: Married  ?  Spouse name: Not on file  ? Number of children: Not on file  ? Years of education: Not on file  ? Highest education level: Not on file  ?Occupational History  ?  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 Smoking status: Never  ? Smokeless tobacco: Never  ?Substance and Sexual Activity  ? Alcohol use: No  ? Drug use: No  ? Sexual activity: Yes  ?Other Topics Concern  ? Not on file  ?Social History Narrative  ? Never Smoked  ? Alcohol use-no  ? Drug use-no  ? Smoking Status:  never  ? Drug Use:  No  ? Exercise--- walking  ?   ?   ?   ? ?  Social Determinants of Health  ? ?Financial Resource Strain: Not on file  ?Food Insecurity: Not on file  ?Transportation Needs: Not on file  ?Physical Activity: Not on file  ?Stress: Not on file  ?Social Connections: Not on file  ?Intimate Partner Violence: Not on file  ? ? ?Outpatient Medications Prior to Visit  ?Medication Sig Dispense Refill  ? famotidine (PEPCID) 20 MG tablet Take 1 tablet (20 mg total) by mouth 2 (two) times daily. 60 tablet 5  ? propranolol (INDERAL) 20 MG tablet Take 0.5 tablets (10 mg total) by mouth 2 (two) times daily. 90 tablet 1  ? azelastine (ASTELIN) 0.1 % nasal spray Place 1 spray into both nostrils 2 (two) times daily. Use in each nostril as directed 30 mL 12  ? fluticasone (FLONASE) 50 MCG/ACT nasal spray Place 2 sprays into both nostrils as needed for allergies or rhinitis. 16 g 5  ? Ciclopirox 1 % shampoo Massage into scalp and  let sit 5 min , rinse (Patient not taking: Reported on 04/28/2021) 120 mL 0  ? Vitamin D, Ergocalciferol, (DRISDOL) 1.25 MG (50000 UNIT) CAPS capsule Take 1 capsule (50,000 Units total) by mouth every 7 (seven) days. (Patient not taking: Reported on 04/28/2021) 12 capsule 1  ? ?No facility-administered medications prior to visit.  ? ? ?Allergies  ?Allergen Reactions  ? Cetirizine & Related Palpitations  ?  Per pt  ? ? ?Review of Systems  ?Constitutional:  Negative for fever.  ?HENT:  Positive for ear pain. Negative for congestion, hearing loss, sinus pain, sore throat and tinnitus.   ?Eyes:  Negative for blurred vision and pain.  ?Respiratory:  Negative for cough, sputum production, shortness of breath and wheezing.   ?Cardiovascular:  Negative for chest pain and palpitations.  ?Gastrointestinal:  Negative for blood in stool, constipation, diarrhea, nausea and vomiting.  ?Genitourinary:  Negative for dysuria, frequency, hematuria and urgency.  ?     (+) brown discharge   ?Musculoskeletal:  Negative for back pain, falls and myalgias.  ?Neurological:  Positive for dizziness, tingling and headaches. Negative for sensory change, loss of consciousness and weakness.  ?Endo/Heme/Allergies:  Negative for environmental allergies. Does not bruise/bleed easily.  ?Psychiatric/Behavioral:  Negative for depression and suicidal ideas. The patient is not nervous/anxious and does not have insomnia.   ? ?   ?Objective:  ?  ?Physical Exam ?Constitutional:   ?   General: She is not in acute distress. ?   Appearance: Normal appearance. She is not ill-appearing.  ?HENT:  ?   Head: Normocephalic and atraumatic.  ?   Right Ear: External ear normal.  ?   Left Ear: External ear normal.  ?Eyes:  ?   Extraocular Movements: Extraocular movements intact.  ?   Pupils: Pupils are equal, round, and reactive to light.  ?Cardiovascular:  ?   Rate and Rhythm: Normal rate and regular rhythm.  ?   Pulses: Normal pulses.  ?   Heart sounds: Normal heart  sounds. No murmur heard. ?  No gallop.  ?Pulmonary:  ?   Effort: Pulmonary effort is normal. No respiratory distress.  ?   Breath sounds: Normal breath sounds. No wheezing, rhonchi or rales.  ?Abdominal:  ?   General: Bowel sounds are normal. There is no distension.  ?   Palpations: Abdomen is soft. There is no mass.  ?   Tenderness: There is no abdominal tenderness. There is no guarding or rebound.  ?   Hernia: No hernia is present.  ?  Genitourinary: ?   Vagina: Normal.  ?   Comments: Lot of blood during pap smear ?Musculoskeletal:  ?   Cervical back: Normal range of motion and neck supple.  ?Lymphadenopathy:  ?   Cervical: No cervical adenopathy.  ?Skin: ?   General: Skin is warm and dry.  ?Neurological:  ?   Mental Status: She is alert and oriented to person, place, and time.  ?Psychiatric:     ?   Behavior: Behavior normal.  ? ? ?BP 110/88 (BP Location: Right Arm, Patient Position: Sitting, Cuff Size: Normal)   Pulse 85   Temp 98 ?F (36.7 ?C) (Oral)   Resp 18   Ht '5\' 4"'$  (1.626 m)   Wt 158 lb (71.7 kg)   SpO2 99%   BMI 27.12 kg/m?  ?Wt Readings from Last 3 Encounters:  ?04/28/21 158 lb (71.7 kg)  ?01/30/21 164 lb 3.2 oz (74.5 kg)  ?11/29/20 158 lb 9.6 oz (71.9 kg)  ? ? ?Diabetic Foot Exam - Simple   ?No data filed ?  ? ?Lab Results  ?Component Value Date  ? WBC 6.6 04/15/2020  ? HGB 11.9 (L) 04/15/2020  ? HCT 36.4 04/15/2020  ? PLT 175.0 04/15/2020  ? GLUCOSE 91 04/15/2020  ? CHOL 150 04/15/2020  ? TRIG 105.0 04/15/2020  ? HDL 37.80 (L) 04/15/2020  ? [place] 91 04/15/2020  ? ALT 10 04/15/2020  ? AST 11 04/15/2020  ? NA 139 04/15/2020  ? K 4.5 04/15/2020  ? CL 106 04/15/2020  ? CREATININE 0.80 04/15/2020  ? BUN 10 04/15/2020  ? CO2 27 04/15/2020  ? TSH 0.99 04/15/2020  ? HGBA1C 5.3 03/03/2015  ? ? ?Lab Results  ?Component Value Date  ? TSH 0.99 04/15/2020  ? ?Lab Results  ?Component Value Date  ? WBC 6.6 04/15/2020  ? HGB 11.9 (L) 04/15/2020  ? HCT 36.4 04/15/2020  ? MCV 81.9 04/15/2020  ? PLT 175.0  04/15/2020  ? ?Lab Results  ?Component Value Date  ? NA 139 04/15/2020  ? K 4.5 04/15/2020  ? CO2 27 04/15/2020  ? GLUCOSE 91 04/15/2020  ? BUN 10 04/15/2020  ? CREATININE 0.80 04/15/2020  ? BILITOT 0.4 04/15/2020  ? AL

## 2021-04-28 NOTE — Assessment & Plan Note (Signed)
Pap done ?Check US pelvis  ?

## 2021-04-28 NOTE — Patient Instructions (Signed)

## 2021-04-28 NOTE — Assessment & Plan Note (Signed)
PT ?Massage ?Heat  ?Stretches shown to pt  ?

## 2021-04-28 NOTE — Assessment & Plan Note (Signed)
ghm utd Check labs see avs  

## 2021-05-01 ENCOUNTER — Other Ambulatory Visit: Payer: Self-pay | Admitting: Family Medicine

## 2021-05-01 DIAGNOSIS — R1013 Epigastric pain: Secondary | ICD-10-CM

## 2021-05-04 LAB — CYTOLOGY - PAP
Chlamydia: NEGATIVE
Comment: NEGATIVE
Comment: NEGATIVE
Comment: NEGATIVE
Comment: NORMAL
Diagnosis: NEGATIVE
HSV1: NEGATIVE
HSV2: NEGATIVE
Neisseria Gonorrhea: NEGATIVE
Trichomonas: NEGATIVE

## 2021-05-09 ENCOUNTER — Other Ambulatory Visit: Payer: Self-pay

## 2021-05-09 ENCOUNTER — Ambulatory Visit (HOSPITAL_BASED_OUTPATIENT_CLINIC_OR_DEPARTMENT_OTHER)
Admission: RE | Admit: 2021-05-09 | Discharge: 2021-05-09 | Disposition: A | Discharge status: Home / Self Care | Payer: BC Managed Care – PPO | Source: Ambulatory Visit | Attending: Family Medicine | Admitting: Family Medicine

## 2021-05-09 DIAGNOSIS — D251 Intramural leiomyoma of uterus: Secondary | ICD-10-CM | POA: Diagnosis not present

## 2021-05-09 DIAGNOSIS — N939 Abnormal uterine and vaginal bleeding, unspecified: Secondary | ICD-10-CM | POA: Diagnosis not present

## 2021-05-09 DIAGNOSIS — D250 Submucous leiomyoma of uterus: Secondary | ICD-10-CM | POA: Diagnosis not present

## 2021-05-10 ENCOUNTER — Other Ambulatory Visit: Payer: Self-pay

## 2021-05-10 DIAGNOSIS — D259 Leiomyoma of uterus, unspecified: Secondary | ICD-10-CM

## 2021-05-17 ENCOUNTER — Encounter: Payer: Self-pay | Admitting: Obstetrics and Gynecology

## 2021-05-17 ENCOUNTER — Ambulatory Visit: Payer: BC Managed Care – PPO | Admitting: Obstetrics and Gynecology

## 2021-05-17 VITALS — BP 132/82 | HR 90 | Ht 64.0 in | Wt 161.4 lb

## 2021-05-17 DIAGNOSIS — D259 Leiomyoma of uterus, unspecified: Secondary | ICD-10-CM

## 2021-05-17 DIAGNOSIS — Z862 Personal history of diseases of the blood and blood-forming organs and certain disorders involving the immune mechanism: Secondary | ICD-10-CM

## 2021-05-17 LAB — FERRITIN: Ferritin: 10 ng/mL — ABNORMAL LOW (ref 16–232)

## 2021-05-17 NOTE — Progress Notes (Addendum)
46 y.o. [id number] Married Other or two or more races Not Hispanic or Latino female here for a consultation from Dr Carollee Herter secondary to a new diagnosis of fibroids. Her primary set her up for an ultrasound secondary to her change in menstrual cycles and multiple fibroids were noted.  ?She used to spot for 3 days leading up to her cycle, now she is spotting for 3-4 days. She then flows for 3 days. Total bleeding is 6-7 days. She does have a h/o iron deficiency anemia. In 3/21 her hgb was 9.9. ?Period Cycle (Days): 28 ?Period Duration (Days): 3 (She has spotting 3-4 days before her period) ?Period Pattern: Regular ?Menstrual Flow: Moderate ?Menstrual Control: Thin pad ?Menstrual Control Change Freq (Hours): 6 ?Dysmenorrhea: None ?No dyspareunia.  ?No bowel or bladder c/o.  ? ? ?Ultrasound 05/09/21 ?FINDINGS: ?Uterus ?Measurements: 9.4 x 5.6 x 6.1 cm = volume: 167 mL. Anteverted. ?Heterogeneous myometrium. Multiple small uterine leiomyomata ?identified. Intramural leiomyoma anterior upper uterus 2.6 cm ?greatest size. Small submucosal leiomyoma anterior mid uterus 1.6 cm ?diameter. Additional small intramural leiomyoma anterior mid uterus ?1.4 cm. Posterior intramural leiomyoma mid uterus 1.2 cm. ?Endometrium: Thickness: 8 mm.  No endometrial fluid or mass ?Right ovary: Measurements: 3.0 x 1.9 x 2.4 cm = volume: 6.9 mL. Normal morphology ?without mass ?Left ovary: Measurements: 2.5 x 1.5 x 1.0 cm = volume: 1.6 mL. Normal morphology ?without mass  ?Other findings: No free pelvic fluid.  No adnexal masses.  ?IMPRESSION: ?Multiple small uterine leiomyomata as above, 1 of which extends ?submucosal at anterior mid uterus.  ?Remainder of exam unremarkable. ? ?I reviewed the ultrasound images with the patient. She had some blood in her uterine cavity at the time of the ultrasound which created a natural sonohysterogram. On my review of the ultrasound, no clear intracavitary defects were seen.  ? ?04/28/21 labs: ?TSH 1.35 ?Hgb  12.7 ?Pap negative, negative GC/CT/Trich and hsv. No hpv testing ? ?09/01/19 ?Ferritin 10.2  ? ?07/30/19  ?Ferritin 6.8 ? ?She was on iron in 2021, no longer on iron. She does take a multivit a day. She eats chicken, fish, eggs and vegetables. Last ferritin level was checked in 7/21 ? ?Patient's last menstrual period was 04/29/2021.          ?Sexually active: Yes.    ?The current method of family planning is condoms most of the time.    ?Exercising: Yes.     Walking  ?Smoker:  no ? ?Health Maintenance: ?Pap:  04/28/21 WNL Hr HPV Neg  ?History of abnormal Pap:  no ?MMG:  07/07/20 density C Bi-rads 1 neg  ?BMD:   none  ?Colonoscopy: none  ?TDaP:  06/19/11 ?Gardasil: none  ? ? reports that she has never smoked. She has been exposed to tobacco smoke. She has never used smokeless tobacco. She reports that she does not drink alcohol and does not use drugs. She is a Medical laboratory scientific officer. Sons are 14 and 9.   ? ?Past Medical History:  ?Diagnosis Date  ? Anxiety   ? Fibroid   ? Headache(784.0)   ? Lipoma of unspecified site   ? Other acute reactions to stress   ? Palpitations   ? SVD (spontaneous vaginal delivery) 06/18/2011  ? Tachycardia   ? ? ?Past Surgical History:  ?Procedure Laterality Date  ? NO PAST SURGERIES    ? ? ?Current Outpatient Medications  ?Medication Sig Dispense Refill  ? azelastine (ASTELIN) 0.1 % nasal spray Place 1 spray into both nostrils 2 (  two) times daily. Use in each nostril as directed 30 mL 12  ? CALCIUM PO Take by mouth.    ? famotidine (PEPCID) 20 MG tablet TAKE 1 TABLET BY MOUTH TWICE A DAY 60 tablet 5  ? fluticasone (FLONASE) 50 MCG/ACT nasal spray Place 2 sprays into both nostrils as needed for allergies or rhinitis. 16 g 5  ? Multiple Vitamin (MULTI VITAMIN DAILY PO) Take by mouth.    ? propranolol (INDERAL) 20 MG tablet Take 0.5 tablets (10 mg total) by mouth 2 (two) times daily. 90 tablet 1  ? ?No current facility-administered medications for this visit.  ? ? ?Family History  ?Problem Relation  Age of Onset  ? Breast cancer Mother 70  ? Hypertension Mother   ? Hypertension Other   ? Anesthesia problems Neg Hx   ? Hypotension Neg Hx   ? Malignant hyperthermia Neg Hx   ? Pseudochol deficiency Neg Hx   ? ? ?Review of Systems  ?All other systems reviewed and are negative. ? ?Exam:   ?BP 132/82   Pulse 90   Ht '5\' 4"'$  (1.626 m)   Wt 161 lb 6.4 oz (73.2 kg)   LMP 04/29/2021   SpO2 100%   BMI 27.70 kg/m?   Weight change: '@WEIGHTCHANGE'$ @ Height:   Height: '5\' 4"'$  (162.6 cm)  ?Ht Readings from Last 3 Encounters:  ?05/17/21 '5\' 4"'$  (1.626 m)  ?04/28/21 '5\' 4"'$  (1.626 m)  ?01/30/21 '5\' 4"'$  (1.626 m)  ? ? ?General appearance: alert, cooperative and appears stated age ? ? ?Pelvic: External genitalia:  no lesions ?             Urethra:  normal appearing urethra with no masses, tenderness or lesions ?             Bartholins and Skenes: normal    ?             Vagina: normal appearing vagina with normal color and discharge, no lesions ?             Cervix: no lesions ?              ?Bimanual Exam:  Uterus:  normal size, contour, position, consistency, mobility, non-tender and anteverted ?             Adnexa: no mass, fullness, tenderness ?              ? ?Gae Dry chaperoned for the exam. ? ?1. Uterine leiomyoma, unspecified location ?She has had a slight change in her cycles in the last year, but they are still within normal limits. She bleeds for 6-7 days a month, bleeding is spotting to light flow. She is not currently anemic and her TSH is normal. ?She has multiple small fibroids on ultrasound. We discussed fibroids, images and diagrams were reviewed. At this point her fibroids are not symptomatic, so no treatment is needed. ?-We discussed the parameters for which she should be further evaluated. She will calendar her bleeding. If she is bleeding closer than 21 days, bleeding for more than 7 days a cycle or if she is saturating a pad an hour.  ? ?2. History of iron deficiency anemia ?She requested a B12 level, on review  of her labs her vit B12 has always been normal but her ferritin has been low. She was previously on iron, but now is just on a multivitamin. She had a recent normal Hgb. Will check her ferritin to confirm that she doesn't need  to continue on iron supplements.  ?- Ferritin ? ? ?CC: Dr Carollee Herter ?Note sent ? ?Addendum: she denies vasomotor symptoms ?Information about fibroids from Up To Date was given to the patient  ?

## 2021-05-17 NOTE — Patient Instructions (Signed)
Uterine Fibroids ?Uterine fibroids, also called leiomyomas, are noncancerous (benign) tumors that can grow in the uterus. They can cause heavy menstrual bleeding and pain. Fibroids may also grow in the fallopian tubes, cervix, or tissues (ligaments) near the uterus. ?You may have one or many fibroids. Fibroids vary in size, weight, and where they grow in the uterus. Some can become quite large. Most fibroids do not require medical treatment. ?What are the causes? ?The cause of this condition is not known. ?What increases the risk? ?You are more likely to develop this condition if you: ?Are in your 30s or 40s and have not gone through menopause. ?Have a family history of this condition. ?Are of African American descent. ?Started your menstrual period at age 10 or younger. ?Have never given birth. ?Are overweight or obese. ?What are the signs or symptoms? ?Many women do not have any symptoms. Symptoms of this condition may include: ?Heavy menstrual bleeding. ?Bleeding between menstrual periods. ?Pain and pressure in the pelvic area, between your hip bones. ?Pain during sex. ?Bladder problems, such as needing to urinate right away or more often than usual. ?Inability to have children (infertility). ?Failure to carry pregnancy to term (miscarriage). ?How is this diagnosed? ?This condition may be diagnosed based on: ?Your symptoms and medical history. ?A physical exam. ?A pelvic exam that includes feeling for any tumors. ?Imaging tests, such as ultrasound or MRI. ?How is this treated? ?Treatment for this condition may include follow-up visits with your health care provider to monitor your fibroids for any changes. Other treatment may include: ?Medicines, such as: ?Medicines to relieve pain, including aspirin and NSAIDs, such as ibuprofen or naproxen. ?Hormone therapy. Treatment may be given as a pill or an injection, or it may be inserted into the uterus using an intrauterine device (IUD). ?Surgery that would do one of  the following: ?Remove the fibroids (myomectomy). This may be recommended if fibroids affect your fertility and you want to become pregnant. ?Remove the uterus (hysterectomy). ?Block the blood supply to the fibroids (uterine artery embolization). This can cause them to shrink and die. ?Follow these instructions at home: ?Medicines ?Take over-the-counter and prescription medicines only as told by your health care provider. ?Ask your health care provider if you should take iron pills or eat more iron-rich foods, such as dark green, leafy vegetables. Heavy menstrual bleeding can cause low iron levels. ?Managing pain ?If directed, apply heat to your back or abdomen to reduce pain. Use the heat source that your health care provider recommends, such as a moist heat pack or a heating pad. To apply heat: ?Place a towel between your skin and the heat source. ?Leave the heat on for 20-30 minutes. ?Remove the heat if your skin turns bright red. This is especially important if you are unable to feel pain, heat, or cold. You may have a greater risk of getting burned. ? ?General instructions ?Pay close attention to your menstrual cycle. Tell your health care provider about any changes, such as: ?Heavier bleeding that requires you to change your pads or tampons more than usual. ?A change in the number of days that your menstrual period lasts. ?A change in symptoms that come with your menstrual period, such as back pain or cramps in your abdomen. ?Keep all follow-up visits. This is important, especially if your fibroids need to be monitored for any changes. ?Contact a health care provider if you: ?Have pelvic pain, back pain, or cramps in your abdomen that do not get better with medicine   or heat. ?Develop new bleeding between menstrual periods. ?Have increased bleeding during or between menstrual periods. ?Feel more tired or weak than usual. ?Feel light-headed. ?Get help right away if you: ?Faint. ?Have pelvic pain that suddenly  gets worse. ?Have severe vaginal bleeding that soaks a tampon or pad in 30 minutes or less. ?Summary ?Uterine fibroids are noncancerous (benign) tumors that can develop in the uterus. ?The exact cause of this condition is not known. ?Most fibroids do not require medical treatment unless they affect your ability to have children (fertility). ?Contact a health care provider if you have pelvic pain, back pain, or cramps in your abdomen that do not get better with medicines. ?Get help right away if you faint, have pelvic pain that suddenly gets worse, or have severe vaginal bleeding. ?This information is not intended to replace advice given to you by your health care provider. Make sure you discuss any questions you have with your health care provider. ?Document Revised: 09/01/2019 Document Reviewed: 09/01/2019 ?Elsevier Patient Education ? 2022 Elsevier Inc[employer] ? ?

## 2021-05-23 ENCOUNTER — Encounter: Payer: Self-pay | Admitting: Family Medicine

## 2021-05-23 ENCOUNTER — Other Ambulatory Visit: Payer: Self-pay | Admitting: Family Medicine

## 2021-05-23 DIAGNOSIS — D509 Iron deficiency anemia, unspecified: Secondary | ICD-10-CM

## 2021-05-23 DIAGNOSIS — Z1211 Encounter for screening for malignant neoplasm of colon: Secondary | ICD-10-CM

## 2021-05-24 ENCOUNTER — Encounter: Payer: Self-pay | Admitting: Physical Therapy

## 2021-05-24 ENCOUNTER — Ambulatory Visit: Payer: BC Managed Care – PPO | Attending: Family Medicine | Admitting: Physical Therapy

## 2021-05-24 DIAGNOSIS — M542 Cervicalgia: Secondary | ICD-10-CM | POA: Insufficient documentation

## 2021-05-24 DIAGNOSIS — R293 Abnormal posture: Secondary | ICD-10-CM | POA: Diagnosis not present

## 2021-05-24 DIAGNOSIS — R252 Cramp and spasm: Secondary | ICD-10-CM | POA: Diagnosis not present

## 2021-05-24 DIAGNOSIS — S161XXA Strain of muscle, fascia and tendon at neck level, initial encounter: Secondary | ICD-10-CM | POA: Insufficient documentation

## 2021-05-24 NOTE — Patient Instructions (Signed)
Access Code: [email or web address] ?URL: [email or web address] ?Date: 05/24/2021 ?Prepared by: Glenetta Hew ? ?Exercises ?- Seated Cervical Retraction  - 3 x daily - 7 x weekly - 1 sets - 5 reps - 5 sec  hold ?- Seated Shoulder Rolls  - 3 x daily - 7 x weekly - 1 sets - 10 reps ?- Seated Scapular Retraction  - 3 x daily - 7 x weekly - 1 sets - 10 reps - 5 sec  hold ?- Seated Upper Trapezius Stretch  - 1 x daily - 7 x weekly - 1 sets - 3 reps - 15-30 sec  hold ?- Seated Levator Scapulae Stretch  - 1 x daily - 7 x weekly - 1 sets - 3 reps - 15-30 sec  hold ?- Doorway Pec Stretch at 90 Degrees Abduction  - 1 x daily - 7 x weekly - 1 sets - 3 reps - 15-30sec  hold ?- Corner Pec Major Stretch  - 1 x daily - 7 x weekly - 3 sets - 3 reps - 15-30 sec hold ?- Standing Thoracic Open Book at [employer] 1 x daily - 7 x weekly - 2 sets - 10 reps ? ?Patient Education ?- Office Posture ?

## 2021-05-24 NOTE — Therapy (Signed)
[facility] ?[address] ?[place], [place], [address] ?Phone: [phone]   Fax:  [phone] ? ?Physical Therapy Evaluation ? ?Patient Details  ?Name: Rachel Wiley ?MRN: [id number] ?Date of Birth: 15-Jan-1976 ?Referring Provider (PT): Carollee Herter, Kendrick Fries R ? ? ?Encounter Date: 05/24/2021 ? ? PT End of Session - 05/24/21 0937   ? ? Visit Number 1   ? Number of Visits 5   ? Date for PT Re-Evaluation 06/21/21   ? Authorization Type BCBS   ? PT Start Time [phone]   ? PT Stop Time 4034   ? PT Time Calculation (min) 45 min   ? ?  ?  ? ?  ? ? ?Past Medical History:  ?Diagnosis Date  ? Anxiety   ? Fibroid   ? Headache(784.0)   ? Lipoma of unspecified site   ? Other acute reactions to stress   ? Palpitations   ? SVD (spontaneous vaginal delivery) 06/18/2011  ? Tachycardia   ? ? ?Past Surgical History:  ?Procedure Laterality Date  ? NO PAST SURGERIES    ? ? ?There were no vitals filed for this visit. ? ? ? Subjective Assessment - 05/24/21 0855   ? ? Subjective Pt. reports intermitant severe neck pain, mostly on L side, but has not had any pain the last month.   ? Patient Stated Goals prevent pain from returning.   ? Currently in Pain? Yes   ? Pain Score 0-No pain   ? Pain Location Neck   ? Pain Orientation Left   ? Pain Type Chronic pain   ? Pain Onset 1 to 4 weeks ago   ? Pain Frequency Intermittent   ? Aggravating Factors  seems to be provoked by ear pain   ? ?  ?  ? ?  ? ? ? ? ? [facility] PT Assessment - 05/24/21 0001   ? ?  ? Assessment  ? Medical Diagnosis S16.1XXA (ICD-10-CM) - Strain of neck muscle, initial encounter   ? Referring Provider (PT) Roma Schanz R   ? Onset Date/Surgical Date --   several episodes over last 6 months  ? Hand Dominance Right   ? Prior Therapy none   ?  ? Precautions  ? Precautions None   ?  ? Restrictions  ? Weight Bearing Restrictions No   ?  ? Balance Screen  ? Has the patient fallen in the past 6 months No   ? Has the patient had  a decrease in activity level because of a fear of falling?  No   ? Is the patient reluctant to leave their home because of a fear of falling?  No   ?  ? Prior Function  ? Level of Independence Independent   ? Vocation Full time employment   ? Vocation Surveyor, mining   ? Leisure painting   ?  ? Observation/Other Assessments  ? Observations enters independently in no apparent distress.   ? Focus on Therapeutic Outcomes (FOTO)  neck: 87%.  Predicted outcome not significantly different (83%)   ?  ? Sensation  ? Light Touch Appears Intact   ? Additional Comments no numbness or parasthesias in UE   ?  ? Posture/Postural Control  ? Posture/Postural Control Postural limitations   ? Postural Limitations Rounded Shoulders;Forward head   ?  ? ROM / Strength  ? AROM / PROM / Strength AROM;Strength   ?  ? AROM  ? Overall AROM  Within functional limits for tasks performed   ? Overall AROM Comments tested in sitting   ? AROM Assessment Site Cervical   ? Cervical Flexion 55   ? Cervical Extension 60   ? Cervical - Right Rotation 80   ? Cervical - Left Rotation 80   ?  ? Strength  ? Overall Strength Within functional limits for tasks performed   ? Overall Strength Comments tested in sitting, 5/5 throughout bil UE   ?  ? Palpation  ? Palpation comment tightness in bil UT   ? ?  ?  ? ?  ? ? ? ? ? ? ? ? ? ? ? ? ? ?Objective measurements completed on examination: See above findings.  ? ? ? ? ? [facility] Adult PT Treatment/Exercise - 05/24/21 0001   ? ?  ? Self-Care  ? Self-Care Posture   ? Posture education on posture, postural correction, recommended timer to take frequent posture breaks, desk set up.   ?  ? Exercises  ? Exercises Neck   ?  ? Neck Exercises: Seated  ? Neck Retraction 5 reps;5 secs   ? Shoulder Rolls Backwards;10 reps   ? Other Seated Exercise scap squeezes x 10   ?  ? Neck Exercises: Stretches  ? Upper Trapezius Stretch Right;Left;2 reps;10 seconds   ? Levator Stretch Left;1 rep;10 seconds   ? Corner  Stretch 1 rep;10 seconds   ? Other Neck Stretches standing open books x 5 L for thoracic mobility   ? ?  ?  ? ?  ? ? ? ? ? ? ? ? ? ? PT Education - 05/24/21 0936   ? ? Education Details education on posture, plan of care, anatomy, and initial HEP   ? Person(s) Educated Patient   ? Methods Explanation;Demonstration;Verbal cues;Handout   ? Comprehension Verbalized understanding;Returned demonstration   ? ?  ?  ? ?  ? ? ? PT Short Term Goals - 05/24/21 0944   ? ?  ? PT SHORT TERM GOAL #1  ? Title Compliant with initial HEP   ? Time 2   ? Period Weeks   ? Status New   ? Target Date 06/07/21   ? ?  ?  ? ?  ? ? ? ? PT Long Term Goals - 05/24/21 0945   ? ?  ? PT LONG TERM GOAL #1  ? Title Ind with progressed HEP for postural strengthening.   ? Time 4   ? Period Weeks   ? Status New   ? Target Date 06/21/21   ?  ? PT LONG TERM GOAL #2  ? Title Pt. will report no new onset of neck spasms/pain.   ? Time 4   ? Period Weeks   ? Status New   ? Target Date 06/21/21   ?  ? PT LONG TERM GOAL #3  ? Title Pt. will demonstrate improved sitting posture to decrease neck strain.   ? Baseline foward head, rounded shoulders.   ? Time 4   ? Period Weeks   ? Status New   ? Target Date 06/21/21   ? ?  ?  ? ?  ? ? ? ? ? ? ? ? ? Plan - 05/24/21 0938   ? ? Clinical Impression Statement Rachel Wiley is a 46 year old female referred for cervical strain.  She reports multiple episodes of severe neck pain, often starting with ear pain, that last for about a week before improving.  Due  to time between referral and evaluation, her pain has resolved, however concerned about it returning.  She demonstrates foward head posture and tightness in bil UT/levator scapulae.  Also noted slight tightness in jaw with opening on L.  Discussed role of posture causing tightness in UT/LS/cervical paraspinals/trigger points and referral patterns.   Educated today on sitting posture, and given HEP for postural strengthening.  She would benefit from skilled physical  therapy for postural strengthening to prevent reoccurance of severe muscle spasm and pain.   ? Personal Factors and Comorbidities Time since onset of injury/illness/exacerbation;Profession   ? Stability/Clinical Decision Making Stable/Uncomplicated   ? Clinical Decision Making Low   ? Rehab Potential Excellent   ? PT Frequency 1x / week   ? PT Duration 4 weeks   ? PT Treatment/Interventions ADLs/Self Care Home Management;Cryotherapy;Electrical Stimulation;Moist Heat;Traction;Therapeutic activities;Therapeutic exercise;Neuromuscular re-education;Patient/family education;Dry needling;Manual techniques;Spinal Manipulations;Joint Manipulations;Passive range of motion   ? PT Next Visit Plan review and progress HEP for postural strengthening, manual therapy/modalities PRN   ? PT Home Exercise Plan Access Code: [email or web address]   ? ?  ?  ? ?  ? ? ?Patient will benefit from skilled therapeutic intervention in order to improve the following deficits and impairments:  Decreased endurance, Increased fascial restricitons, Improper body mechanics, Postural dysfunction, Pain, Increased muscle spasms ? ?Visit Diagnosis: ?Cervicalgia ? ?Abnormal posture ? ?Cramp and spasm ? ? ? ? ?Problem List ?Patient Active Problem List  ? Diagnosis Date Noted  ? PND (post-nasal drip) 04/28/2021  ? Cervical strain 04/28/2021  ? Colon cancer screening 04/28/2021  ? Preventative health care 04/28/2021  ? Abnormal bleeding in menstrual cycle 04/28/2021  ? Exposure to the flu 11/30/2020  ? Pharyngitis 11/30/2020  ? Otitis of right ear 09/20/2020  ? Infected tooth 09/20/2020  ? Bunion, right foot 09/01/2019  ? Bilateral carpal tunnel syndrome 09/01/2019  ? Nail abnormalities 09/01/2019  ? Right knee pain 11/11/2016  ? Vaginitis and vulvovaginitis 03/03/2015  ? Plantar fasciitis 05/15/2012  ? Pre-ulcerative corn or callous 05/15/2012  ? External hemorrhoid, bleeding 11/28/2011  ? Anal fissure 11/28/2011  ? SVD (spontaneous vaginal delivery) 06/18/2011  ?  Tachycardia 03/15/2011  ? URI (upper respiratory infection) 02/07/2011  ? LIPOMA 04/28/2010  ? OTHER ACUTE REACTIONS TO STRESS 04/28/2010  ? HEADACHE 04/28/2010  ? PALPITATIONS, RECURRENT 04/28/2010  ? ? ?Benjamine Mola

## 2021-05-26 ENCOUNTER — Ambulatory Visit: Payer: BC Managed Care – PPO | Admitting: Physical Therapy

## 2021-05-30 ENCOUNTER — Ambulatory Visit: Payer: BC Managed Care – PPO

## 2021-06-02 ENCOUNTER — Encounter: Payer: BC Managed Care – PPO | Admitting: Physical Therapy

## 2021-06-05 IMAGING — MG MM DIGITAL SCREENING BILAT W/ TOMO AND CAD
8 series · 8 of 24 positions shown · non-contrast
Comparison: Previous exam(s).

CLINICAL DATA: Screening.

EXAM:
DIGITAL SCREENING BILATERAL MAMMOGRAM WITH TOMOSYNTHESIS AND CAD
TECHNIQUE: Bilateral screening digital craniocaudal and mediolateral oblique
mammograms were obtained. Bilateral screening digital breast
tomosynthesis was performed. The images were evaluated with
computer-aided detection.

[R CC synth-2D]
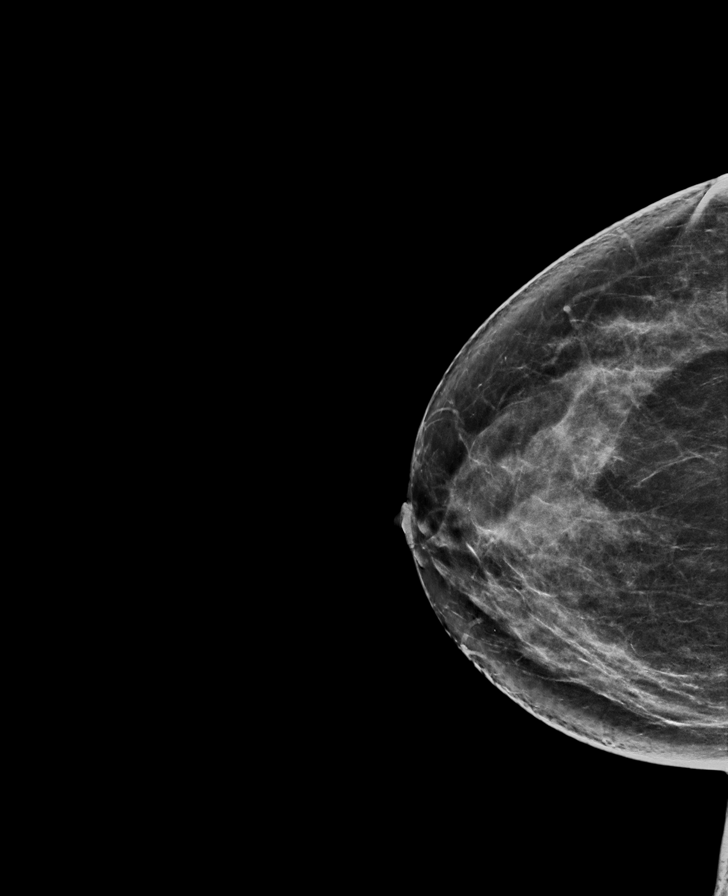

[L CC synth-2D]
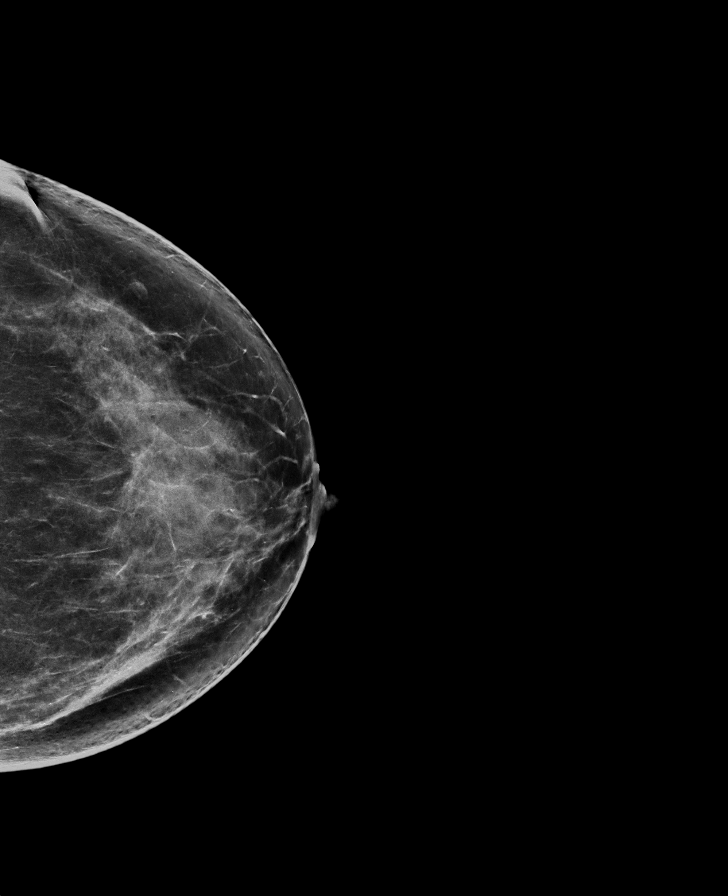

[R MLO synth-2D]
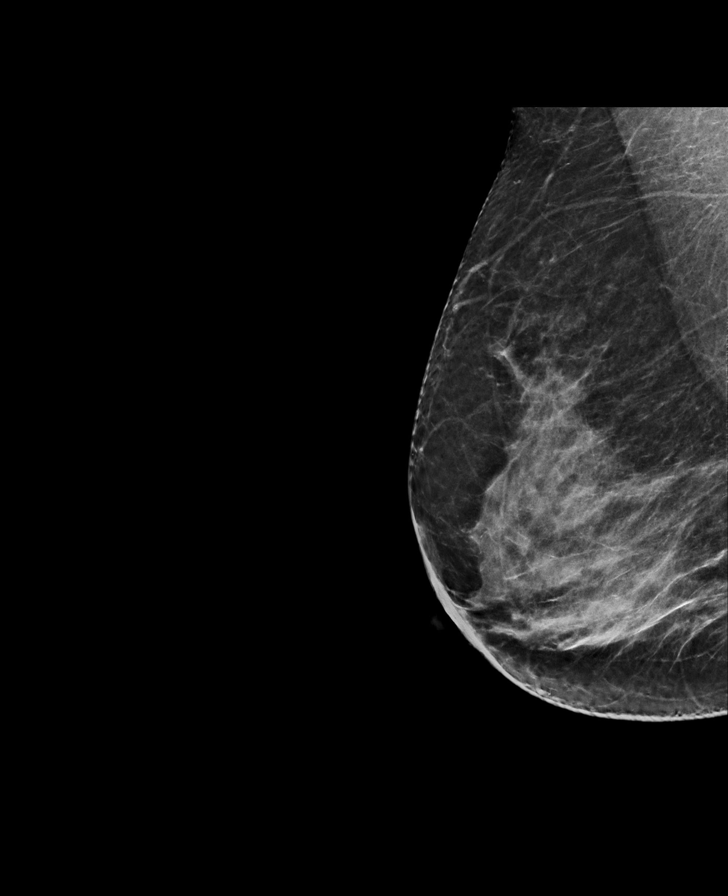

[L MLO synth-2D]
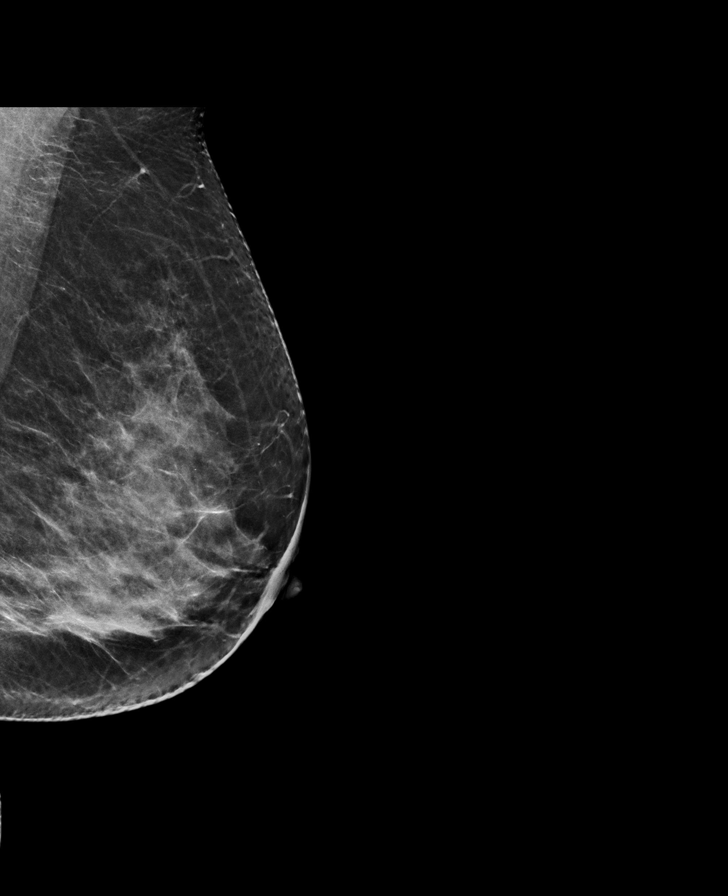

[R CC tomo · tomo slice 36/71.0]
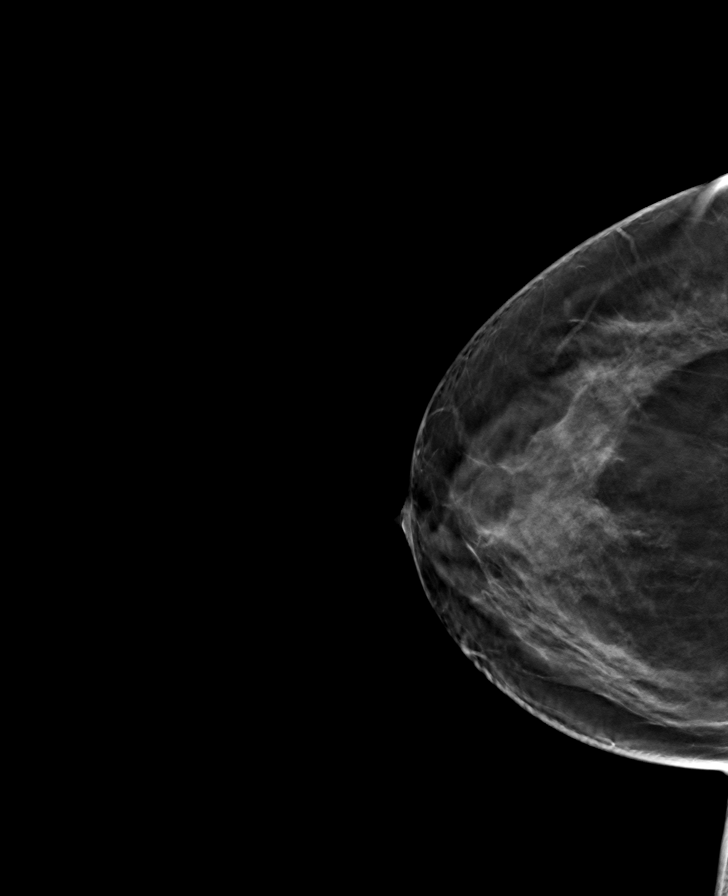

[L MLO tomo · tomo slice 36/71.0]
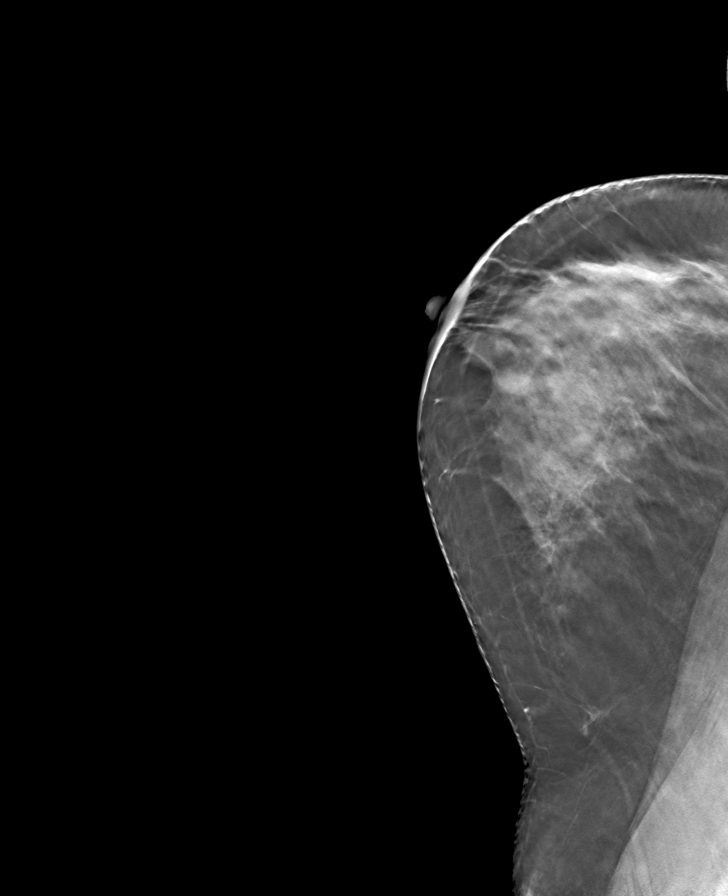

[R MLO tomo · tomo slice 35/70.0]
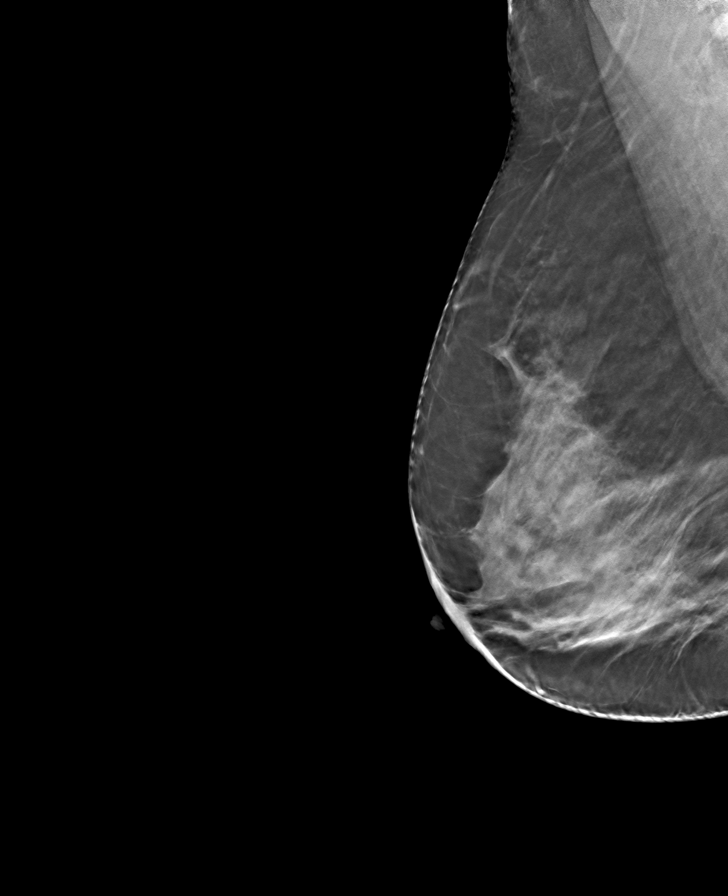

[L CC tomo · tomo slice 38/75.0]
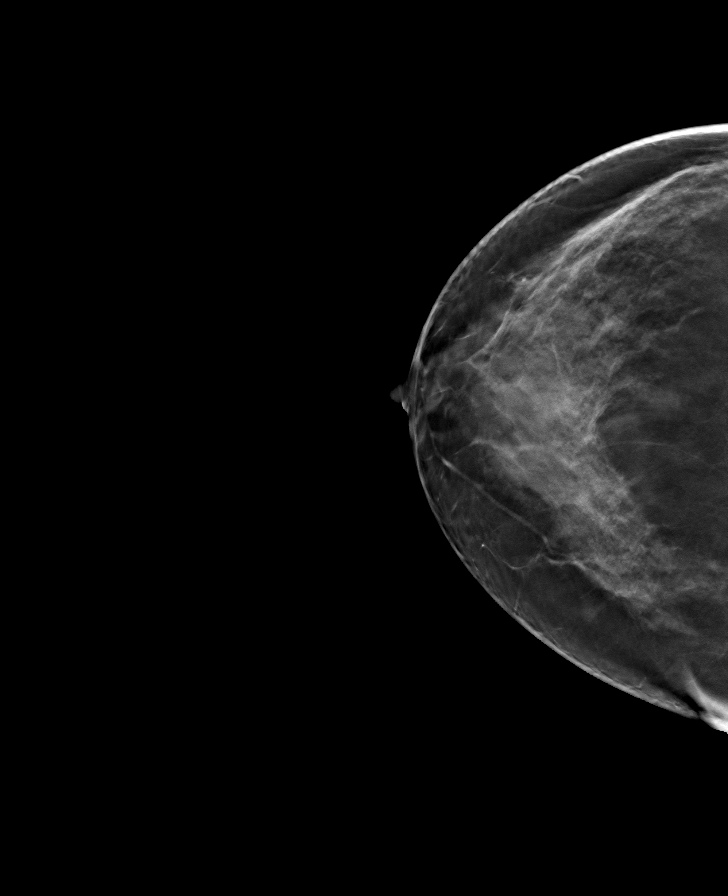

[8 of 24 positions shown; findings below may reference images not displayed]

ACR Breast Density Category c: The breast tissue is heterogeneously
dense, which may obscure small masses.
FINDINGS: There are no findings suspicious for malignancy. The images were
evaluated with computer-aided detection.
IMPRESSION: No mammographic evidence of malignancy. A result letter of this
screening mammogram will be mailed directly to the patient.

RECOMMENDATION:
Screening mammogram in one year. (Code:T4-5-GWO)

BI-RADS CATEGORY  1: Negative.

## 2021-06-06 ENCOUNTER — Ambulatory Visit: Payer: BC Managed Care – PPO | Admitting: Physical Therapy

## 2021-06-06 ENCOUNTER — Encounter: Payer: Self-pay | Admitting: Physical Therapy

## 2021-06-06 DIAGNOSIS — R252 Cramp and spasm: Secondary | ICD-10-CM

## 2021-06-06 DIAGNOSIS — M542 Cervicalgia: Secondary | ICD-10-CM | POA: Diagnosis not present

## 2021-06-06 DIAGNOSIS — R293 Abnormal posture: Secondary | ICD-10-CM

## 2021-06-06 DIAGNOSIS — S161XXA Strain of muscle, fascia and tendon at neck level, initial encounter: Secondary | ICD-10-CM | POA: Diagnosis not present

## 2021-06-06 NOTE — Patient Instructions (Signed)
Access Code: [email or web address] ?URL: [email or web address] ?Date: 06/06/2021 ?Prepared by: Glenetta Hew ? ?Exercises Added  ? ?- Thoracic Extension Mobilization on Foam Roll  - 1 x daily - 7 x weekly - 10 reps ?- Thoracic Mobilization on Foam Roll  - 1 x daily - 7 x weekly - 10 reps ?- Supine Chest Stretch on Foam Roll  - 1 x daily - 7 x weekly - 30 sec  hold ?- Cat Cow  - 1 x daily - 7 x weekly - 10 reps ?- Cobra  - 1 x daily - 7 x weekly - 10 reps ? ?Patient Education ?- Office Posture ?

## 2021-06-06 NOTE — Therapy (Signed)
[facility] ?[place] ?[place], [place], [address] ?Phone: [phone]   Fax:  [phone] ? ?Physical Therapy Treatment ? ?Patient Details  ?Name: Rachel Wiley ?MRN: [id number] ?Date of Birth: 07/05/1975 ?Referring Provider (PT): Carollee Herter, Kendrick Fries R ? ? ?Encounter Date: 06/06/2021 ? ? PT End of Session - 06/06/21 2355   ? ? Visit Number 2   ? Number of Visits 5   ? Date for PT Re-Evaluation 06/21/21   ? Authorization Type BCBS   ? PT Start Time 7322   ? PT Stop Time 1016   ? PT Time Calculation (min) 41 min   ? ?  ?  ? ?  ? ? ?Past Medical History:  ?Diagnosis Date  ? Anxiety   ? Fibroid   ? Headache(784.0)   ? Lipoma of unspecified site   ? Other acute reactions to stress   ? Palpitations   ? SVD (spontaneous vaginal delivery) 06/18/2011  ? Tachycardia   ? ? ?Past Surgical History:  ?Procedure Laterality Date  ? NO PAST SURGERIES    ? ? ?There were no vitals filed for this visit. ? ? Subjective Assessment - 06/06/21 0936   ? ? Subjective Pt. reports doing well but not able to exercise last week due to construction in home.  Did have one day where felt "strain" in UT and headache, but no pain since then.   ? Patient Stated Goals prevent pain from returning.   ? Currently in Pain? No/denies   ? Pain Onset 1 to 4 weeks ago   ? ?  ?  ? ?  ? ? ? ? ? ? ? ? ? ? ? ? ? ? ? ? ? ? ? ? [facility] Adult PT Treatment/Exercise - 06/06/21 0001   ? ?  ? Exercises  ? Exercises Neck   ?  ? Neck Exercises: Machines for Strengthening  ? UBE (Upper Arm Bike) L1 x 6 min (12f3b)   ?  ? Neck Exercises: Supine  ? Other Supine Exercise thoracic mobs on foam roller and thoracic extension   ?  ? Neck Exercises: Prone  ? Other Prone Exercise cat cows x 10, child pose, cobra pressups x 10   ?  ? Manual Therapy  ? Manual Therapy Joint mobilization;Soft tissue mobilization;Myofascial release   ? Manual therapy comments to decrease muscle spasm and improve vertebral mobility   ? Joint  Mobilization PA mobs to cervical spine and thoracic spine grade 3-4, NAGS into rotation   ? Soft tissue mobilization STM to cervical paraspinals, bil UT, and levators   ? Myofascial Release TPR to bil levator scapulae, suboccipital release.   ? ?  ?  ? ?  ? ? ? ? ? ? ? ? ? ? PT Education - 06/06/21 1023   ? ? Education Details HEP update Access Code: [email or web address]  ? Person(s) Educated Patient   ? Methods Explanation;Demonstration;Verbal cues;Other (comment)   updated app  ? Comprehension Verbalized understanding;Returned demonstration   ? ?  ?  ? ?  ? ? ? PT Short Term Goals - 06/06/21 0940   ? ?  ? PT SHORT TERM GOAL #1  ? Title Compliant with initial HEP   ? Time 2   ? Period Weeks   ? Status Achieved   ? Target Date 06/07/21   ? ?  ?  ? ?  ? ? ? ? PT Long Term Goals - 06/06/21  0940   ? ?  ? PT LONG TERM GOAL #1  ? Title Ind with progressed HEP for postural strengthening.   ? Time 4   ? Period Weeks   ? Status On-going   ? Target Date 06/21/21   ?  ? PT LONG TERM GOAL #2  ? Title Pt. will report no new onset of neck spasms/pain.   ? Time 4   ? Period Weeks   ? Status On-going   ? Target Date 06/21/21   ?  ? PT LONG TERM GOAL #3  ? Title Pt. will demonstrate improved sitting posture to decrease neck strain.   ? Baseline foward head, rounded shoulders.   ? Time 4   ? Period Weeks   ? Status On-going   ? Target Date 06/21/21   ? ?  ?  ? ?  ? ? ? ? ? ? ? ? Plan - 06/06/21 1026   ? ? Clinical Impression Statement Rachel Wiley reports compliance with HEP, and no pain today.  She has reported some incidences of tightness and headache and demonstrates increased tightness in bil UT, levators (R>L), and cervical paraspinals, and hypomobility in cervical spine.  Reported decreased tightness following interventions.   ? Personal Factors and Comorbidities Time since onset of injury/illness/exacerbation;Profession   ? Stability/Clinical Decision Making Stable/Uncomplicated   ? Rehab Potential Excellent   ? PT Frequency 1x / week   ? PT  Duration 4 weeks   ? PT Treatment/Interventions ADLs/Self Care Home Management;Cryotherapy;Electrical Stimulation;Moist Heat;Traction;Therapeutic activities;Therapeutic exercise;Neuromuscular re-education;Patient/family education;Dry needling;Manual techniques;Spinal Manipulations;Joint Manipulations;Passive range of motion   ? PT Next Visit Plan give theraband exercises for postural strengthening, manual therapy/modalities PRN   ? PT Home Exercise Plan Access Code: [email or web address]   ? ?  ?  ? ?  ? ? ?Patient will benefit from skilled therapeutic intervention in order to improve the following deficits and impairments:  Decreased endurance, Increased fascial restricitons, Improper body mechanics, Postural dysfunction, Pain, Increased muscle spasms ? ?Visit Diagnosis: ?Cervicalgia ? ?Abnormal posture ? ?Cramp and spasm ? ? ? ? ?Problem List ?Patient Active Problem List  ? Diagnosis Date Noted  ? PND (post-nasal drip) 04/28/2021  ? Cervical strain 04/28/2021  ? Colon cancer screening 04/28/2021  ? Preventative health care 04/28/2021  ? Abnormal bleeding in menstrual cycle 04/28/2021  ? Exposure to the flu 11/30/2020  ? Pharyngitis 11/30/2020  ? Otitis of right ear 09/20/2020  ? Infected tooth 09/20/2020  ? Bunion, right foot 09/01/2019  ? Bilateral carpal tunnel syndrome 09/01/2019  ? Nail abnormalities 09/01/2019  ? Right knee pain 11/11/2016  ? Vaginitis and vulvovaginitis 03/03/2015  ? Plantar fasciitis 05/15/2012  ? Pre-ulcerative corn or callous 05/15/2012  ? External hemorrhoid, bleeding 11/28/2011  ? Anal fissure 11/28/2011  ? SVD (spontaneous vaginal delivery) 06/18/2011  ? Tachycardia 03/15/2011  ? URI (upper respiratory infection) 02/07/2011  ? LIPOMA 04/28/2010  ? OTHER ACUTE REACTIONS TO STRESS 04/28/2010  ? HEADACHE 04/28/2010  ? PALPITATIONS, RECURRENT 04/28/2010  ? ? ?Rennie Natter, PT, DPT  ?06/06/2021, 10:40 AM ? ?[facility] ?[place] ?[place], [place], [address] ?Phone: [phone]   Fax:  [phone] ? ?Name: Rachel Wiley ?MRN: [id number] ?Date of Birth: 1975-10-23 ? ? ? ?

## 2021-06-09 ENCOUNTER — Encounter: Payer: BC Managed Care – PPO | Admitting: Physical Therapy

## 2021-06-13 ENCOUNTER — Ambulatory Visit: Payer: BC Managed Care – PPO | Attending: Family Medicine

## 2021-06-13 DIAGNOSIS — R252 Cramp and spasm: Secondary | ICD-10-CM | POA: Diagnosis not present

## 2021-06-13 DIAGNOSIS — R293 Abnormal posture: Secondary | ICD-10-CM | POA: Diagnosis not present

## 2021-06-13 DIAGNOSIS — M542 Cervicalgia: Secondary | ICD-10-CM | POA: Diagnosis not present

## 2021-06-13 NOTE — Patient Instructions (Signed)
Access Code: [email or web address] ?URL: [email or web address] ?Date: 06/13/2021 ?Prepared by: Clarene Essex ? ?Exercises ?- Seated Cervical Retraction  - 3 x daily - 7 x weekly - 1 sets - 5 reps - 5 sec  hold ?- Seated Shoulder Rolls  - 3 x daily - 7 x weekly - 1 sets - 10 reps ?- Seated Scapular Retraction  - 3 x daily - 7 x weekly - 1 sets - 10 reps - 5 sec  hold ?- Seated Upper Trapezius Stretch  - 1 x daily - 7 x weekly - 1 sets - 3 reps - 15-30 sec  hold ?- Seated Levator Scapulae Stretch  - 1 x daily - 7 x weekly - 1 sets - 3 reps - 15-30 sec  hold ?- Doorway Pec Stretch at 90 Degrees Abduction  - 1 x daily - 7 x weekly - 1 sets - 3 reps - 15-30sec  hold ?- Corner Pec Major Stretch  - 1 x daily - 7 x weekly - 3 sets - 3 reps - 15-30 sec hold ?- Standing Thoracic Open Book at [place] 1 x daily - 7 x weekly - 2 sets - 10 reps ?- Thoracic Extension Mobilization on Foam Roll  - 1 x daily - 7 x weekly - 10 reps ?- Thoracic Mobilization on Foam Roll  - 1 x daily - 7 x weekly - 10 reps ?- Supine Chest Stretch on Foam Roll  - 1 x daily - 7 x weekly - 30 sec  hold ?- Cat Cow  - 1 x daily - 7 x weekly - 10 reps ?- Cobra  - 1 x daily - 7 x weekly - 10 reps ?- Standing Bilateral Low Shoulder Row with Anchored Resistance  - 1 x daily - 3 x weekly - 2 sets - 10 reps ?- Scapular Retraction with Resistance Advanced  - 1 x daily - 3 x weekly - 2 sets - 10 reps ?- Shoulder External Rotation and Scapular Retraction with Resistance  - 1 x daily - 3 x weekly - 2 sets - 10 reps ? ?Patient Education ?- Office Posture ?

## 2021-06-13 NOTE — Therapy (Addendum)
PHYSICAL THERAPY DISCHARGE SUMMARY  Visits from Start of Care: 3  Current functional level related to goals / functional outcomes: From last visit on 06/13/2021 patient has consistently reported no neck pain and no headaches 2-3 weeks.    Remaining deficits: none   Education / Equipment: HEP  Plan: Patient has not been seen since 06/13/2021 but made good progress with therapy.  She is being discharged since has been 30+ days since last seen.     Rennie Natter, PT, DPT 3:21 PM 07/17/2021  [facility] [address]  [place], [place], [address] Phone: [phone]   Fax:  [phone]  Physical Therapy Treatment  Patient Details  Name: Rachel Wiley MRN: [id number] Date of Birth: August 30, 1975 Referring Provider (PT): Carollee Herter, Alferd Apa   Encounter Date: 06/13/2021   PT End of Session - 06/13/21 1023     Visit Number 3    Number of Visits 5    Date for PT Re-Evaluation 06/21/21    Authorization Type BCBS    PT Start Time [phone]    PT Stop Time 1019    PT Time Calculation (min) 45 min    Activity Tolerance Patient tolerated treatment well    Behavior During Therapy [facility] for tasks assessed/performed             Past Medical History:  Diagnosis Date   Anxiety    Fibroid    Headache(784.0)    Lipoma of unspecified site    Other acute reactions to stress    Palpitations    SVD (spontaneous vaginal delivery) 06/18/2011   Tachycardia     Past Surgical History:  Procedure Laterality Date   NO PAST SURGERIES      There were no vitals filed for this visit.   Subjective Assessment - 06/13/21 0936     Subjective Pt doing well, no headaches in the past 2-3 weeks.    Patient Stated Goals prevent pain from returning.    Currently in Pain? No/denies                               [facility] Adult PT Treatment/Exercise - 06/13/21 0001       Neck Exercises: Machines for Strengthening   UBE  (Upper Arm Bike) L2 x 6 min (17f3b)      Neck Exercises: Theraband   Shoulder Extension 10 reps;Red    Rows 10 reps;Red    Shoulder External Rotation 10 reps;Red      Neck Exercises: Seated   Cervical Rotation Both;10 reps    Cervical Rotation Limitations SNAG with pillowcase    Other Seated Exercise cervical extension with towel behind neck 10x3"      Neck Exercises: Prone   Other Prone Exercise cat cow 10x3"    Other Prone Exercise scapular retraction with elbows bent 2x10      Neck Exercises: Stretches   Upper Trapezius Stretch Right;Left;1 rep;30 seconds    Levator Stretch Right;Left;1 rep;30 seconds    Corner Stretch 2 reps;30 seconds                     PT Education - 06/13/21 1035     Education Details added row, retraction, ER to HEP    Person(s) Educated Patient    Methods Explanation;Demonstration;Handout    Comprehension Verbalized understanding;Returned demonstration  PT Short Term Goals - 06/06/21 0940       PT SHORT TERM GOAL #1   Title Compliant with initial HEP    Time 2    Period Weeks    Status Achieved    Target Date 06/07/21               PT Long Term Goals - 06/06/21 0940       PT LONG TERM GOAL #1   Title Ind with progressed HEP for postural strengthening.    Time 4    Period Weeks    Status On-going    Target Date 06/21/21      PT LONG TERM GOAL #2   Title Pt. will report no new onset of neck spasms/pain.    Time 4    Period Weeks    Status On-going    Target Date 06/21/21      PT LONG TERM GOAL #3   Title Pt. will demonstrate improved sitting posture to decrease neck strain.    Baseline foward head, rounded shoulders.    Time 4    Period Weeks    Status On-going    Target Date 06/21/21                   Plan - 06/13/21 1036     Clinical Impression Statement Pt showed a good response to treatment, denied any tightness or pain in cervical region today. Progressed HEP with TB  postural exercises. Mild cues required with ER to keep elbows to side. Cues with cat cow to avoid push up motion rather than thoracic flexion/extension. Pt had no onsets of pain after interventions.    Personal Factors and Comorbidities Time since onset of injury/illness/exacerbation;Profession    PT Frequency 1x / week    PT Duration 4 weeks    PT Treatment/Interventions ADLs/Self Care Home Management;Cryotherapy;Electrical Stimulation;Moist Heat;Traction;Therapeutic activities;Therapeutic exercise;Neuromuscular re-education;Patient/family education;Dry needling;Manual techniques;Spinal Manipulations;Joint Manipulations;Passive range of motion    PT Next Visit Plan postural strengthening, thoracic mobility; manual therapy/modalities PRN    PT Home Exercise Plan Access Code: [email or web address]    Consulted and Agree with Plan of Care Patient             Patient will benefit from skilled therapeutic intervention in order to improve the following deficits and impairments:  Decreased endurance, Increased fascial restricitons, Improper body mechanics, Postural dysfunction, Pain, Increased muscle spasms  Visit Diagnosis: Cervicalgia  Abnormal posture  Cramp and spasm     Problem List Patient Active Problem List   Diagnosis Date Noted   PND (post-nasal drip) 04/28/2021   Cervical strain 04/28/2021   Colon cancer screening 04/28/2021   Preventative health care 04/28/2021   Abnormal bleeding in menstrual cycle 04/28/2021   Exposure to the flu 11/30/2020   Pharyngitis 11/30/2020   Otitis of right ear 09/20/2020   Infected tooth 09/20/2020   Bunion, right foot 09/01/2019   Bilateral carpal tunnel syndrome 09/01/2019   Nail abnormalities 09/01/2019   Right knee pain 11/11/2016   Vaginitis and vulvovaginitis 03/03/2015   Plantar fasciitis 05/15/2012   Pre-ulcerative corn or callous 05/15/2012   External hemorrhoid, bleeding 11/28/2011   Anal fissure 11/28/2011   SVD (spontaneous vaginal  delivery) 06/18/2011   Tachycardia 03/15/2011   URI (upper respiratory infection) 02/07/2011   LIPOMA 04/28/2010   OTHER ACUTE REACTIONS TO STRESS 04/28/2010   HEADACHE 04/28/2010   PALPITATIONS, RECURRENT 04/28/2010    Artist Pais, PTA 06/13/2021, 10:42 AM  [facility] [address]  [place], [place], [address] Phone: [phone]   Fax:  [phone]  Name: Deklynn Charlet MRN: [id number] Date of Birth: Jul 09, 1975

## 2021-06-20 ENCOUNTER — Ambulatory Visit: Payer: BC Managed Care – PPO | Admitting: Physical Therapy

## 2021-09-15 ENCOUNTER — Ambulatory Visit (AMBULATORY_SURGERY_CENTER): Payer: Self-pay | Admitting: *Deleted

## 2021-09-15 VITALS — Ht 64.0 in | Wt 163.4 lb

## 2021-09-15 DIAGNOSIS — Z1211 Encounter for screening for malignant neoplasm of colon: Secondary | ICD-10-CM

## 2021-09-15 MED ORDER — NA SULFATE-K SULFATE-MG SULF 17.5-3.13-1.6 GM/177ML PO SOLN: 1.0000 | Freq: Once | ORAL | 0 refills | Status: AC

## 2021-09-15 NOTE — Progress Notes (Signed)
No egg or soy allergy known to patient  No issues known to pt with past sedation with any surgeries or procedures Patient denies ever being told they had issues or difficulty with intubation  No FH of Malignant Hyperthermia Pt is not on diet pills Pt is not on  home 02  Pt is not on blood thinners  Pt denies issues with constipation  No A fib or A flutter Have any cardiac testing pending--no Pt instructed to use Singlecare.com or GoodRx for a price reduction on prep   

## 2021-09-26 ENCOUNTER — Encounter: Payer: Self-pay | Admitting: Gastroenterology

## 2021-10-02 ENCOUNTER — Telehealth: Payer: Self-pay | Admitting: Gastroenterology

## 2021-10-02 NOTE — Telephone Encounter (Signed)
Inbound call from patient cancelling upcoming procedure for 10/04/21 at 9:00 am due to her care partner having a family emergency and having to go out of town, which is her husband. Patient states she will call back in November to proceed with scheduling.

## 2021-10-02 NOTE — Telephone Encounter (Signed)
Noted. Thanks.

## 2021-10-04 ENCOUNTER — Encounter: Payer: BC Managed Care – PPO | Admitting: Gastroenterology

## 2021-10-17 ENCOUNTER — Encounter: Payer: BC Managed Care – PPO | Admitting: Gastroenterology

## 2021-12-08 ENCOUNTER — Ambulatory Visit (HOSPITAL_BASED_OUTPATIENT_CLINIC_OR_DEPARTMENT_OTHER)
Admission: RE | Admit: 2021-12-08 | Discharge: 2021-12-08 | Disposition: A | Discharge status: Home / Self Care | Payer: BC Managed Care – PPO | Source: Ambulatory Visit | Attending: Family Medicine | Admitting: Family Medicine

## 2021-12-08 ENCOUNTER — Encounter: Payer: Self-pay | Admitting: Family Medicine

## 2021-12-08 ENCOUNTER — Other Ambulatory Visit: Payer: Self-pay | Admitting: Family Medicine

## 2021-12-08 ENCOUNTER — Ambulatory Visit: Payer: BC Managed Care – PPO | Admitting: Family Medicine

## 2021-12-08 ENCOUNTER — Encounter (HOSPITAL_BASED_OUTPATIENT_CLINIC_OR_DEPARTMENT_OTHER): Payer: Self-pay

## 2021-12-08 VITALS — BP 158/84 | HR 79 | Temp 98.1°F | Ht 64.0 in | Wt 160.0 lb

## 2021-12-08 DIAGNOSIS — R1031 Right lower quadrant pain: Secondary | ICD-10-CM | POA: Diagnosis not present

## 2021-12-08 DIAGNOSIS — R109 Unspecified abdominal pain: Secondary | ICD-10-CM | POA: Diagnosis not present

## 2021-12-08 DIAGNOSIS — R1013 Epigastric pain: Secondary | ICD-10-CM | POA: Diagnosis not present

## 2021-12-08 DIAGNOSIS — Z1211 Encounter for screening for malignant neoplasm of colon: Secondary | ICD-10-CM

## 2021-12-08 LAB — COMPREHENSIVE METABOLIC PANEL
ALT: 14 U/L (ref 0–35)
AST: 15 U/L (ref 0–37)
Albumin: 4.2 g/dL (ref 3.5–5.2)
Alkaline Phosphatase: 53 U/L (ref 39–117)
BUN: 12 mg/dL (ref 6–23)
CO2: 28 mEq/L (ref 19–32)
Calcium: 9.5 mg/dL (ref 8.4–10.5)
Chloride: 102 mEq/L (ref 96–112)
Creatinine, Ser: 0.75 mg/dL (ref 0.40–1.20)
GFR: 95.77 mL/min (ref >60.00)
Glucose, Bld: 87 mg/dL (ref 70–99)
Potassium: 3.8 mEq/L (ref 3.5–5.1)
Sodium: 137 mEq/L (ref 135–145)
Total Bilirubin: 0.3 mg/dL (ref 0.2–1.2)
Total Protein: 7.3 g/dL (ref 6.0–8.3)

## 2021-12-08 LAB — CBC WITH DIFFERENTIAL/PLATELET
Basophils Absolute: 0 10*3/uL (ref 0.0–0.1)
Basophils Relative: 0.5 % (ref 0.0–3.0)
Eosinophils Absolute: 0.3 10*3/uL (ref 0.0–0.7)
Eosinophils Relative: 3.8 % (ref 0.0–5.0)
HCT: 40.3 % (ref 36.0–46.0)
Hemoglobin: 13.3 g/dL (ref 12.0–15.0)
Lymphocytes Relative: 29.8 % (ref 12.0–46.0)
Lymphs Abs: 2.2 10*3/uL (ref 0.7–4.0)
MCHC: 33 g/dL (ref 30.0–36.0)
MCV: 87.9 fl (ref 78.0–100.0)
Monocytes Absolute: 0.6 10*3/uL (ref 0.1–1.0)
Monocytes Relative: 7.7 % (ref 3.0–12.0)
Neutro Abs: 4.3 10*3/uL (ref 1.4–7.7)
Neutrophils Relative %: 58.2 % (ref 43.0–77.0)
Platelets: 218 10*3/uL (ref 150.0–400.0)
RBC: 4.58 Mil/uL (ref 3.87–5.11)
RDW: 13.6 % (ref 11.5–15.5)
WBC: 7.3 10*3/uL (ref 4.0–10.5)

## 2021-12-08 LAB — H. PYLORI ANTIBODY, IGG: H Pylori IgG: NEGATIVE

## 2021-12-08 MED ORDER — IOHEXOL 300 MG/ML  SOLN
100.0000 mL | Freq: Once | INTRAMUSCULAR | Status: AC | PRN
  Administered 2021-12-08: 100 mL via INTRAVENOUS

## 2021-12-08 MED ORDER — PANTOPRAZOLE SODIUM 40 MG PO TBEC: 40.0000 mg | DELAYED_RELEASE_TABLET | Freq: Every day | ORAL | 3 refills | Status: DC

## 2021-12-08 NOTE — Patient Instructions (Signed)

## 2021-12-08 NOTE — Assessment & Plan Note (Signed)
protonix daily  Check labs  Avoid spicy, acidic and fatty foods Avoid caffeine and peppermint Consider GI referral

## 2021-12-08 NOTE — Progress Notes (Addendum)
Subjective:   By signing my name below, I, Roma Schanz, attest that this documentation has been prepared under the direction and in the presence of Roma Schanz, 12/08/2021.   Patient ID: Rachel Wiley, female    DOB: Jun 18, 1975, 46 y.o.   MRN: [id number]  No chief complaint on file.   HPI Patient is in today for an office visit.  She has had RLQ and midepigastric pain x 7-8 days   no nvd.  No fever she tried pepcid which helped with the midepigastric pain but not RLQ pain  Pain with movement   Health Maintenance Due  Topic Date Due   COVID-19 Vaccine (4 - Pfizer risk series) 01/30/2020   COLONOSCOPY (Pts 45-60yr Insurance coverage will need to be confirmed)  Never done   TETANUS/TDAP  06/18/2021   MAMMOGRAM  07/05/2021    Past Medical History:  Diagnosis Date   Allergy    mild   Anemia    Anxiety    Fibroid    GERD (gastroesophageal reflux disease)    Headache(784.0)    Lipoma of unspecified site    Other acute reactions to stress    Palpitations    SVD (spontaneous vaginal delivery) 06/18/2011   Tachycardia     Past Surgical History:  Procedure Laterality Date   NO PAST SURGERIES      Family History  Problem Relation Age of Onset   Breast cancer Mother 657  Hypertension Mother    Hypertension Other    Anesthesia problems Neg Hx    Hypotension Neg Hx    Malignant hyperthermia Neg Hx    Pseudochol deficiency Neg Hx    Colon cancer Neg Hx    Colon polyps Neg Hx    Crohn's disease Neg Hx    Esophageal cancer Neg Hx    Rectal cancer Neg Hx    Stomach cancer Neg Hx    Ulcerative colitis Neg Hx     Social History   Socioeconomic History   Marital status: Married    Spouse name: Not on file   Number of children: 2   Years of education: Not on file   Highest education level: Not on file  Occupational History    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  Tobacco Use   Smoking status: Never    Passive exposure: Past   Smokeless tobacco: Never   Vaping Use   Vaping Use: Never used  Substance and Sexual Activity   Alcohol use: No   Drug use: No   Sexual activity: Yes    Partners: Male    Birth control/protection: Condom  Other Topics Concern   Not on file  Social History Narrative   Never Smoked   Alcohol use-no   Drug use-no   Smoking Status:  never   Drug Use:  No   Exercise--- walking            Social Determinants of Health   Financial Resource Strain: Not on file  Food Insecurity: Not on file  Transportation Needs: Not on file  Physical Activity: Not on file  Stress: Not on file  Social Connections: Not on file  Intimate Partner Violence: Not on file    Outpatient Medications Prior to Visit  Medication Sig Dispense Refill   azelastine (ASTELIN) 0.1 % nasal spray Place 1 spray into both nostrils 2 (two) times daily. Use in each nostril as directed 30 mL 12   CALCIUM PO Take by mouth as needed.  famotidine (PEPCID) 20 MG tablet TAKE 1 TABLET BY MOUTH TWICE A DAY (Patient taking differently: as needed.) 60 tablet 5   fluticasone (FLONASE) 50 MCG/ACT nasal spray Place 2 sprays into both nostrils as needed for allergies or rhinitis. 16 g 5   Multiple Vitamin (MULTI VITAMIN DAILY PO) Take by mouth.     No facility-administered medications prior to visit.    Allergies  Allergen Reactions   Cetirizine & Related Palpitations    Per pt    Review of Systems  Constitutional:  Negative for chills, fever and malaise/fatigue.  HENT:  Negative for congestion.   Eyes:  Negative for blurred vision.  Respiratory:  Negative for shortness of breath.   Cardiovascular:  Negative for chest pain, palpitations and leg swelling.  Gastrointestinal:  Positive for abdominal pain and heartburn. Negative for blood in stool, constipation, diarrhea, melena, nausea and vomiting.  Genitourinary:  Negative for dysuria and frequency.  Musculoskeletal:  Negative for falls.  Skin:  Negative for rash.  Neurological:  Negative for  dizziness, loss of consciousness and headaches.  Endo/Heme/Allergies:  Negative for environmental allergies.  Psychiatric/Behavioral:  Negative for depression. The patient is not nervous/anxious.        Objective:    Physical Exam Constitutional:      General: She is not in acute distress.    Appearance: Normal appearance. She is not ill-appearing.  HENT:     Head: Normocephalic and atraumatic.     Right Ear: External ear normal.     Left Ear: External ear normal.  Eyes:     Extraocular Movements: Extraocular movements intact.     Pupils: Pupils are equal, round, and reactive to light.  Cardiovascular:     Rate and Rhythm: Normal rate and regular rhythm.     Heart sounds: Normal heart sounds. No murmur heard.    No gallop.  Pulmonary:     Effort: Pulmonary effort is normal. No respiratory distress.     Breath sounds: Normal breath sounds. No wheezing or rales.  Abdominal:     General: There is no distension.     Palpations: Abdomen is soft. There is no mass.     Tenderness: There is abdominal tenderness. There is guarding. There is no right CVA tenderness, left CVA tenderness or rebound.     Hernia: No hernia is present.  Skin:    General: Skin is warm and dry.  Neurological:     Mental Status: She is alert and oriented to person, place, and time.  Psychiatric:        Judgment: Judgment normal.     BP (!) 158/84   Pulse 79   Temp 98.1 F (36.7 C)   Ht '5\' 4"'$  (1.626 m)   Wt 160 lb (72.6 kg)   SpO2 100%   BMI 27.46 kg/m  Wt Readings from Last 3 Encounters:  12/08/21 160 lb (72.6 kg)  09/15/21 163 lb 6.4 oz (74.1 kg)  05/17/21 161 lb 6.4 oz (73.2 kg)       Assessment & Plan:   Problem List Items Addressed This Visit       Unprioritized   RLQ abdominal pain    Check cbc and cmp Ct abd pelvis and go to er if pain worsens ----- r/o AP        Relevant Orders   CBC with Differential/Platelet   H. pylori antibody, IgG   Comprehensive metabolic panel    POCT Urinalysis Dipstick (Automated)   Dyspepsia - Primary  protonix daily  Check labs  Avoid spicy, acidic and fatty foods Avoid caffeine and peppermint Consider GI referral      Relevant Medications   pantoprazole (PROTONIX) 40 MG tablet   Other Relevant Orders   CBC with Differential/Platelet   H. pylori antibody, IgG   Comprehensive metabolic panel   Other Visit Diagnoses     Right lower quadrant abdominal pain       Relevant Orders   CT Abdomen Pelvis W Contrast   POCT Urinalysis Dipstick (Automated)      Meds ordered this encounter  Medications   pantoprazole (PROTONIX) 40 MG tablet    Sig: Take 1 tablet (40 mg total) by mouth daily.    Dispense:  30 tablet    Refill:  3    I, Roma Schanz, personally preformed the services described in this documentation.  All medical record entries made by the scribe were at my direction and in my presence.  I have reviewed the chart and discharge instructions (if applicable) and agree that the record reflects my personal performance and is accurate and complete. 12/08/2021.   I,Verona Buck,acting as a Education administrator for [employer], DO.,have documented all relevant documentation on the behalf of [name], DO,as directed by  [name], DO while in the presence of [name], DO.    [name], DO

## 2021-12-08 NOTE — Assessment & Plan Note (Addendum)
Check cbc and cmp Ct abd pelvis and go to er if pain worsens ----- r/o AP

## 2022-01-15 ENCOUNTER — Encounter: Payer: Self-pay | Admitting: Physical Therapy

## 2022-01-22 ENCOUNTER — Other Ambulatory Visit: Payer: Self-pay | Admitting: Family Medicine

## 2022-01-22 ENCOUNTER — Telehealth: Payer: Self-pay | Admitting: Family Medicine

## 2022-01-22 DIAGNOSIS — Z0000 Encounter for general adult medical examination without abnormal findings: Secondary | ICD-10-CM

## 2022-01-22 NOTE — Telephone Encounter (Signed)
Patient scheduled annual physical for 05/04/22 but would like to get her labs done a week prior to the appt. Please advise when labs are placed

## 2022-01-22 NOTE — Telephone Encounter (Signed)
Pt needs lab appointment please 

## 2022-02-28 DIAGNOSIS — H9201 Otalgia, right ear: Secondary | ICD-10-CM | POA: Diagnosis not present

## 2022-02-28 DIAGNOSIS — R42 Dizziness and giddiness: Secondary | ICD-10-CM | POA: Diagnosis not present

## 2022-04-25 ENCOUNTER — Encounter: Payer: Self-pay | Admitting: Family Medicine

## 2022-04-26 ENCOUNTER — Other Ambulatory Visit: Payer: Self-pay | Admitting: Family Medicine

## 2022-04-26 DIAGNOSIS — D509 Iron deficiency anemia, unspecified: Secondary | ICD-10-CM

## 2022-04-26 DIAGNOSIS — Z0000 Encounter for general adult medical examination without abnormal findings: Secondary | ICD-10-CM

## 2022-04-26 DIAGNOSIS — R1031 Right lower quadrant pain: Secondary | ICD-10-CM

## 2022-04-27 ENCOUNTER — Other Ambulatory Visit (HOSPITAL_BASED_OUTPATIENT_CLINIC_OR_DEPARTMENT_OTHER): Payer: Self-pay | Admitting: Family Medicine

## 2022-04-27 ENCOUNTER — Ambulatory Visit: Payer: BC Managed Care – PPO | Admitting: Gastroenterology

## 2022-04-27 ENCOUNTER — Other Ambulatory Visit (INDEPENDENT_AMBULATORY_CARE_PROVIDER_SITE_OTHER): Payer: BC Managed Care – PPO

## 2022-04-27 DIAGNOSIS — Z0000 Encounter for general adult medical examination without abnormal findings: Secondary | ICD-10-CM

## 2022-04-27 DIAGNOSIS — Z1231 Encounter for screening mammogram for malignant neoplasm of breast: Secondary | ICD-10-CM

## 2022-04-27 DIAGNOSIS — D509 Iron deficiency anemia, unspecified: Secondary | ICD-10-CM

## 2022-04-27 LAB — LIPID PANEL
Cholesterol: 164 mg/dL (ref 0–200)
HDL: 35.7 mg/dL — ABNORMAL LOW (ref >39.00)
LDL Cholesterol: 100 mg/dL — ABNORMAL HIGH (ref 0–99)
NonHDL: 127.85
Total CHOL/HDL Ratio: 5
Triglycerides: 140 mg/dL (ref 0.0–149.0)
VLDL: 28 mg/dL (ref 0.0–40.0)

## 2022-04-27 LAB — VITAMIN D 25 HYDROXY (VIT D DEFICIENCY, FRACTURES): VITD: 19.03 ng/mL — ABNORMAL LOW (ref 30.00–100.00)

## 2022-04-27 LAB — CBC WITH DIFFERENTIAL/PLATELET
Basophils Absolute: 0 10*3/uL (ref 0.0–0.1)
Basophils Relative: 0.6 % (ref 0.0–3.0)
Eosinophils Absolute: 0.3 10*3/uL (ref 0.0–0.7)
Eosinophils Relative: 5 % (ref 0.0–5.0)
HCT: 39.7 % (ref 36.0–46.0)
Hemoglobin: 13.1 g/dL (ref 12.0–15.0)
Lymphocytes Relative: 31.1 % (ref 12.0–46.0)
Lymphs Abs: 2 10*3/uL (ref 0.7–4.0)
MCHC: 33.1 g/dL (ref 30.0–36.0)
MCV: 87.7 fl (ref 78.0–100.0)
Monocytes Absolute: 0.5 10*3/uL (ref 0.1–1.0)
Monocytes Relative: 8.2 % (ref 3.0–12.0)
Neutro Abs: 3.5 10*3/uL (ref 1.4–7.7)
Neutrophils Relative %: 55.1 % (ref 43.0–77.0)
Platelets: 206 10*3/uL (ref 150.0–400.0)
RBC: 4.53 Mil/uL (ref 3.87–5.11)
RDW: 14 % (ref 11.5–15.5)
WBC: 6.3 10*3/uL (ref 4.0–10.5)

## 2022-04-27 LAB — COMPREHENSIVE METABOLIC PANEL
ALT: 12 U/L (ref 0–35)
AST: 12 U/L (ref 0–37)
Albumin: 3.8 g/dL (ref 3.5–5.2)
Alkaline Phosphatase: 52 U/L (ref 39–117)
BUN: 11 mg/dL (ref 6–23)
CO2: 27 mEq/L (ref 19–32)
Calcium: 8.7 mg/dL (ref 8.4–10.5)
Chloride: 104 mEq/L (ref 96–112)
Creatinine, Ser: 0.78 mg/dL (ref 0.40–1.20)
GFR: 91.12 mL/min (ref >60.00)
Glucose, Bld: 90 mg/dL (ref 70–99)
Potassium: 4.6 mEq/L (ref 3.5–5.1)
Sodium: 137 mEq/L (ref 135–145)
Total Bilirubin: 0.5 mg/dL (ref 0.2–1.2)
Total Protein: 6.8 g/dL (ref 6.0–8.3)

## 2022-04-27 LAB — VITAMIN B12: Vitamin B-12: 288 pg/mL (ref 211–911)

## 2022-04-27 LAB — TSH: TSH: 1.41 u[IU]/mL (ref 0.35–5.50)

## 2022-04-28 LAB — IRON,TIBC AND FERRITIN PANEL
%SAT: 30 % (calc) (ref 16–45)
Ferritin: 12 ng/mL — ABNORMAL LOW (ref 16–232)
Iron: 112 ug/dL (ref 40–190)
TIBC: 377 mcg/dL (calc) (ref 250–450)

## 2022-05-02 ENCOUNTER — Other Ambulatory Visit: Payer: Self-pay | Admitting: *Deleted

## 2022-05-02 MED ORDER — VITAMIN D (ERGOCALCIFEROL) 1.25 MG (50000 UNIT) PO CAPS: 50000.0000 [IU] | ORAL_CAPSULE | ORAL | 0 refills | Status: DC

## 2022-05-03 ENCOUNTER — Inpatient Hospital Stay (HOSPITAL_BASED_OUTPATIENT_CLINIC_OR_DEPARTMENT_OTHER): Admission: RE | Admit: 2022-05-03 | Payer: BC Managed Care – PPO | Source: Ambulatory Visit

## 2022-05-04 ENCOUNTER — Encounter: Payer: Self-pay | Admitting: Family Medicine

## 2022-05-04 ENCOUNTER — Ambulatory Visit (INDEPENDENT_AMBULATORY_CARE_PROVIDER_SITE_OTHER): Payer: BC Managed Care – PPO | Admitting: Family Medicine

## 2022-05-04 VITALS — BP 110/80 | HR 87 | Temp 98.4°F | Resp 18 | Ht 64.0 in | Wt 165.4 lb

## 2022-05-04 DIAGNOSIS — Z0000 Encounter for general adult medical examination without abnormal findings: Secondary | ICD-10-CM

## 2022-05-04 DIAGNOSIS — R1013 Epigastric pain: Secondary | ICD-10-CM | POA: Diagnosis not present

## 2022-05-04 DIAGNOSIS — R002 Palpitations: Secondary | ICD-10-CM | POA: Diagnosis not present

## 2022-05-04 DIAGNOSIS — Z23 Encounter for immunization: Secondary | ICD-10-CM

## 2022-05-04 MED ORDER — FAMOTIDINE 20 MG PO TABS: 20.0000 mg | ORAL_TABLET | Freq: Two times a day (BID) | ORAL | 3 refills | Status: DC

## 2022-05-04 NOTE — Patient Instructions (Signed)
Preventive Care 40-47 Years Old, Female Preventive care refers to lifestyle choices and visits with your health care provider that can promote health and wellness. Preventive care visits are also called wellness exams. What can I expect for my preventive care visit? Counseling Your health care provider may ask you questions about your: Medical history, including: Past medical problems. Family medical history. Pregnancy history. Current health, including: Menstrual cycle. Method of birth control. Emotional well-being. Home life and relationship well-being. Sexual activity and sexual health. Lifestyle, including: Alcohol, nicotine or tobacco, and drug use. Access to firearms. Diet, exercise, and sleep habits. Work and work environment. Sunscreen use. Safety issues such as seatbelt and bike helmet use. Physical exam Your health care provider will check your: Height and weight. These may be used to calculate your BMI (body mass index). BMI is a measurement that tells if you are at a healthy weight. Waist circumference. This measures the distance around your waistline. This measurement also tells if you are at a healthy weight and may help predict your risk of certain diseases, such as type 2 diabetes and high blood pressure. Heart rate and blood pressure. Body temperature. Skin for abnormal spots. What immunizations do I need?  Vaccines are usually given at various ages, according to a schedule. Your health care provider will recommend vaccines for you based on your age, medical history, and lifestyle or other factors, such as travel or where you work. What tests do I need? Screening Your health care provider may recommend screening tests for certain conditions. This may include: Lipid and cholesterol levels. Diabetes screening. This is done by checking your blood sugar (glucose) after you have not eaten for a while (fasting). Pelvic exam and Pap test. Hepatitis B test. Hepatitis C  test. HIV (human immunodeficiency virus) test. STI (sexually transmitted infection) testing, if you are at risk. Lung cancer screening. Colorectal cancer screening. Mammogram. Talk with your health care provider about when you should start having regular mammograms. This may depend on whether you have a family history of breast cancer. BRCA-related cancer screening. This may be done if you have a family history of breast, ovarian, tubal, or peritoneal cancers. Bone density scan. This is done to screen for osteoporosis. Talk with your health care provider about your test results, treatment options, and if necessary, the need for more tests. Follow these instructions at home: Eating and drinking  Eat a diet that includes fresh fruits and vegetables, whole grains, lean protein, and low-fat dairy products. Take vitamin and mineral supplements as recommended by your health care provider. Do not drink alcohol if: Your health care provider tells you not to drink. You are pregnant, may be pregnant, or are planning to become pregnant. If you drink alcohol: Limit how much you have to 0-1 drink a day. Know how much alcohol is in your drink. In the [place] one drink equals one 12 oz bottle of beer (355 mL), one 5 oz glass of wine (148 mL), or one 1 oz glass of hard liquor (44 mL). Lifestyle Brush your teeth every morning and night with fluoride toothpaste. Floss one time each day. Exercise for at least 30 minutes 5 or more days each week. Do not use any products that contain nicotine or tobacco. These products include cigarettes, chewing tobacco, and vaping devices, such as e-cigarettes. If you need help quitting, ask your health care provider. Do not use drugs. If you are sexually active, practice safe sex. Use a condom or other form of protection to   prevent STIs. If you do not wish to become pregnant, use a form of birth control. If you plan to become pregnant, see your health care provider for a  prepregnancy visit. Take aspirin only as told by your health care provider. Make sure that you understand how much to take and what form to take. Work with your health care provider to find out whether it is safe and beneficial for you to take aspirin daily. Find healthy ways to manage stress, such as: Meditation, yoga, or listening to music. Journaling. Talking to a trusted person. Spending time with friends and family. Minimize exposure to UV radiation to reduce your risk of skin cancer. Safety Always wear your seat belt while driving or riding in a vehicle. Do not drive: If you have been drinking alcohol. Do not ride with someone who has been drinking. When you are tired or distracted. While texting. If you have been using any mind-altering substances or drugs. Wear a helmet and other protective equipment during sports activities. If you have firearms in your house, make sure you follow all gun safety procedures. Seek help if you have been physically or sexually abused. What's next? Visit your health care provider once a year for an annual wellness visit. Ask your health care provider how often you should have your eyes and teeth checked. Stay up to date on all vaccines. This information is not intended to replace advice given to you by your health care provider. Make sure you discuss any questions you have with your health care provider. Document Revised: 07/27/2020 Document Reviewed: 07/27/2020 Elsevier Patient Education  2023 Elsevier Inc.  

## 2022-05-04 NOTE — Assessment & Plan Note (Signed)
Ghm utd Check labs  See AVS  Health Maintenance Due  Topic Date Due   COLONOSCOPY (Pts 45-39yrs Insurance coverage will need to be confirmed)  Never done   MAMMOGRAM  07/05/2021

## 2022-05-04 NOTE — Progress Notes (Addendum)
Subjective:   By signing my name below, I, Rachel Wiley, attest that this documentation has been prepared under the direction and in the presence of [name], DO 05/04/22   Patient ID: Rachel Wiley, female    DOB: August 14, 1975, 47 y.o.   MRN: [id number]  Chief Complaint  Patient presents with   Annual Exam    HPI Patient is in today for comprehensive physical exam.   She presents with concerns about her latest blood work from last week. She has started Vit D3 and plans to start Vit D2 soon as well. She is also concerned about her lipid panel and notes for the last 7-8 months she has not been on a vegetarian diet. She has made dietary changes and is eating healthy overall.   She states she has been gaining weight and lately feels too fatigued to stay active. Since she began working from home she has not been as active. She used to walk about 1-1.5 miles for 35-45 minutes on her lunch break. She currently is not weight training and does not have a gym membership. She is interested in exercising more.  She monitors her blood pressure at home and reports a reading of 115/99. She notes it tends to be high when she eats certain foods. She has not used any antihypertensives to resolve but walking has helped in the past.   She has been following up with Dr. Talbert Nan from gynecology for fibroids. Her last visit was on 05/17/2021.   She also follows up with gastrology. She has been complaint with Pepcid for her GERD and notes it seems to help. She was prescribed Pantoprazole but did not start due to concerns after researching side effects.   Last colonoscopy: N/A. Plans to schedule soon.   Last mammogram: 07/05/2020. Results were normal. Next scheduled 05/08/2022.  Last pap: 04/28/2021. Results were normal.   Last bone density: N/A  She is UTD on latest Covid vaccine. She is due for Tdap and is receptive to receiving it.   She is UTD on routine dental and vision care.   Past Medical  History:  Diagnosis Date   Allergy    mild   Anemia    Anxiety    Fibroid    GERD (gastroesophageal reflux disease)    Headache(784.0)    Lipoma of unspecified site    Other acute reactions to stress    Palpitations    SVD (spontaneous vaginal delivery) 06/18/2011   Tachycardia     Past Surgical History:  Procedure Laterality Date   NO PAST SURGERIES      Family History  Problem Relation Age of Onset   Cancer Mother    Breast cancer Mother 46   Hypertension Mother    Hypertension Other    Anesthesia problems Neg Hx    Hypotension Neg Hx    Malignant hyperthermia Neg Hx    Pseudochol deficiency Neg Hx    Colon cancer Neg Hx    Colon polyps Neg Hx    Crohn's disease Neg Hx    Esophageal cancer Neg Hx    Rectal cancer Neg Hx    Stomach cancer Neg Hx    Ulcerative colitis Neg Hx     Social History   Socioeconomic History   Marital status: Married    Spouse name: Not on file   Number of children: 2   Years of education: Not on file   Highest education level: Not on file  Occupational History  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  Tobacco Use   Smoking status: Never    Passive exposure: Past   Smokeless tobacco: Never  Vaping Use   Vaping Use: Never used  Substance and Sexual Activity   Alcohol use: No   Drug use: No   Sexual activity: Yes    Partners: Male    Birth control/protection: Condom  Other Topics Concern   Not on file  Social History Narrative   Never Smoked   Alcohol use-no   Drug use-no   Smoking Status:  never   Drug Use:  No   Exercise--- walking            Social Determinants of Health   Financial Resource Strain: Not on file  Food Insecurity: Not on file  Transportation Needs: Not on file  Physical Activity: Not on file  Stress: Not on file  Social Connections: Not on file  Intimate Partner Violence: Not on file    Outpatient Medications Prior to Visit  Medication Sig Dispense Refill   azelastine (ASTELIN) 0.1 % nasal  spray Place 1 spray into both nostrils 2 (two) times daily. Use in each nostril as directed 30 mL 12   CALCIUM PO Take by mouth as needed.     fluticasone (FLONASE) 50 MCG/ACT nasal spray Place 2 sprays into both nostrils as needed for allergies or rhinitis. 16 g 5   Multiple Vitamin (MULTI VITAMIN DAILY PO) Take by mouth.     propranolol (INDERAL) 20 MG tablet 1 po as needed palpatations     Vitamin D, Ergocalciferol, (DRISDOL) 1.25 MG (50000 UNIT) CAPS capsule Take 1 capsule (50,000 Units total) by mouth every 7 (seven) days. 12 capsule 0   famotidine (PEPCID) 20 MG tablet TAKE 1 TABLET BY MOUTH TWICE A DAY (Patient taking differently: as needed.) 60 tablet 5   pantoprazole (PROTONIX) 40 MG tablet Take 1 tablet (40 mg total) by mouth daily. 30 tablet 3   No facility-administered medications prior to visit.    Allergies  Allergen Reactions   Cetirizine & Related Palpitations    Per pt    Review of Systems  Constitutional:  Positive for malaise/fatigue.  Gastrointestinal:  Positive for abdominal pain (epigastric).       Objective:    Physical Exam Constitutional:      General: She is not in acute distress.    Appearance: Normal appearance.  HENT:     Head: Normocephalic and atraumatic.     Right Ear: External ear normal.     Left Ear: External ear normal.  Eyes:     Extraocular Movements: Extraocular movements intact.     Pupils: Pupils are equal, round, and reactive to light.  Cardiovascular:     Rate and Rhythm: Normal rate and regular rhythm.     Pulses: Normal pulses.     Heart sounds: Normal heart sounds. No murmur heard.    No gallop.  Pulmonary:     Effort: Pulmonary effort is normal. No respiratory distress.     Breath sounds: Normal breath sounds. No wheezing.  Skin:    General: Skin is warm and dry.  Neurological:     Mental Status: She is alert and oriented to person, place, and time.  Psychiatric:        Judgment: Judgment normal.     BP 110/80 (BP  Location: Left Arm, Patient Position: Sitting, Cuff Size: Normal)   Pulse 87   Temp 98.4 F (36.9 C) (Oral)   Resp  18   Ht 5\' 4"  (1.626 m)   Wt 165 lb 6.4 oz (75 kg)   LMP 04/26/2022   SpO2 99%   BMI 28.39 kg/m  Wt Readings from Last 3 Encounters:  05/04/22 165 lb 6.4 oz (75 kg)  12/08/21 160 lb (72.6 kg)  09/15/21 163 lb 6.4 oz (74.1 kg)       Assessment & Plan:  Preventative health care Assessment & Plan: Ghm utd Check labs  See AVS  Health Maintenance Due  Topic Date Due   COLONOSCOPY (Pts 45-36yrs Insurance coverage will need to be confirmed)  Never done   MAMMOGRAM  07/05/2021      Epigastric pain -     Famotidine; Take 1 tablet (20 mg total) by mouth 2 (two) times daily.  Dispense: 180 tablet; Refill: 3  Need for tetanus booster -     Tdap vaccine greater than or equal to 7yo IM  Palpitations     I,Rachel Rivera,acting as a scribe for [name], DO.,have documented all relevant documentation on the behalf of [name], DO,as directed by  [name], DO while in the presence of [name], DO.   I, [name], DO, personally preformed the services described in this documentation.  All medical record entries made by the scribe were at my direction and in my presence.  I have reviewed the chart and discharge instructions (if applicable) and agree that the record reflects my personal performance and is accurate and complete. 05/04/22   [name], DO

## 2022-05-08 ENCOUNTER — Ambulatory Visit (HOSPITAL_BASED_OUTPATIENT_CLINIC_OR_DEPARTMENT_OTHER)
Admission: RE | Admit: 2022-05-08 | Discharge: 2022-05-08 | Disposition: A | Discharge status: Home / Self Care | Payer: BC Managed Care – PPO | Source: Ambulatory Visit | Attending: Family Medicine | Admitting: Family Medicine

## 2022-05-08 ENCOUNTER — Encounter (HOSPITAL_BASED_OUTPATIENT_CLINIC_OR_DEPARTMENT_OTHER): Payer: Self-pay

## 2022-05-08 DIAGNOSIS — Z1231 Encounter for screening mammogram for malignant neoplasm of breast: Secondary | ICD-10-CM | POA: Diagnosis not present

## 2022-07-06 ENCOUNTER — Ambulatory Visit: Payer: BC Managed Care – PPO | Admitting: Family Medicine

## 2022-07-06 VITALS — BP 112/60 | HR 80 | Temp 98.2°F | Resp 18 | Ht 64.0 in | Wt 163.8 lb

## 2022-07-06 DIAGNOSIS — N760 Acute vaginitis: Secondary | ICD-10-CM

## 2022-07-06 DIAGNOSIS — J029 Acute pharyngitis, unspecified: Secondary | ICD-10-CM

## 2022-07-06 LAB — POCT RAPID STREP A (OFFICE): Rapid Strep A Screen: NEGATIVE

## 2022-07-06 LAB — POCT INFLUENZA A/B
Influenza A, POC: NEGATIVE
Influenza B, POC: NEGATIVE

## 2022-07-06 LAB — POC COVID19 BINAXNOW: SARS Coronavirus 2 Ag: NEGATIVE

## 2022-07-06 MED ORDER — AMOXICILLIN 875 MG PO TABS: 875.0000 mg | ORAL_TABLET | Freq: Two times a day (BID) | ORAL | 0 refills | Status: AC

## 2022-07-06 MED ORDER — FLUCONAZOLE 150 MG PO TABS: 150.0000 mg | ORAL_TABLET | Freq: Every day | ORAL | 1 refills | Status: DC

## 2022-07-06 NOTE — Progress Notes (Signed)
Subjective:   By signing my name below, I, Carlena Bjornstad, attest that this documentation has been prepared under the direction and in the presence of Seabron Spates R, DO. 07/06/2022.   Patient ID: Rachel Wiley, female    DOB: 1975/08/18, 47 y.o.   MRN: [id number]  Chief Complaint  Patient presents with   Sore Throat    Sxs started Sunday, pt states right ear pressure and itchiness. Some pain with swallowing. Occ productive cough. No COVID test.     HPI Patient is in today for an office visit.  She complains of a sore throat with onset this past Sunday. Her throat constantly feels painful and itchy. For a time it had improved, but her symptoms are worsening again since yesterday. She also has a cough and dysphagia. Sometimes feels like something gets stuck in her throat. Additionally she complains of right ear pressure/pain.  On Tuesday, she was feeling feverish. She took tylenol once without much improvement. Currently she is feeling hot but denies feeling feverish.  We will test for strep, influenza, and Covid-19 today. All testing was negative.  Past Medical History:  Diagnosis Date   Allergy    mild   Anemia    Anxiety    Fibroid    GERD (gastroesophageal reflux disease)    Headache(784.0)    Lipoma of unspecified site    Other acute reactions to stress    Palpitations    SVD (spontaneous vaginal delivery) 06/18/2011   Tachycardia     Past Surgical History:  Procedure Laterality Date   NO PAST SURGERIES      Family History  Problem Relation Age of Onset   Cancer Mother    Breast cancer Mother 76   Hypertension Mother    Hypertension Other    Anesthesia problems Neg Hx    Hypotension Neg Hx    Malignant hyperthermia Neg Hx    Pseudochol deficiency Neg Hx    Colon cancer Neg Hx    Colon polyps Neg Hx    Crohn's disease Neg Hx    Esophageal cancer Neg Hx    Rectal cancer Neg Hx    Stomach cancer Neg Hx    Ulcerative colitis Neg Hx     Social History    Socioeconomic History   Marital status: Married    Spouse name: Not on file   Number of children: 2   Years of education: Not on file   Highest education level: Bachelor's degree (e.g., BA, AB, BS)  Occupational History    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  Tobacco Use   Smoking status: Never    Passive exposure: Past   Smokeless tobacco: Never  Vaping Use   Vaping Use: Never used  Substance and Sexual Activity   Alcohol use: No   Drug use: No   Sexual activity: Yes    Partners: Male    Birth control/protection: Condom  Other Topics Concern   Not on file  Social History Narrative   Never Smoked   Alcohol use-no   Drug use-no   Smoking Status:  never   Drug Use:  No   Exercise--- walking            Social Determinants of Health   Financial Resource Strain: Low Risk  (07/06/2022)   Overall Financial Resource Strain (CARDIA)    Difficulty of Paying Living Expenses: Not hard at all  Food Insecurity: No Food Insecurity (07/06/2022)   Hunger Vital Sign  Worried About Programme researcher, broadcasting/film/video in the Last Year: Never true    Ran Out of Food in the Last Year: Never true  Transportation Needs: No Transportation Needs (07/06/2022)   PRAPARE - Administrator, Civil Service (Medical): No    Lack of Transportation (Non-Medical): No  Physical Activity: Insufficiently Active (07/06/2022)   Exercise Vital Sign    Days of Exercise per Week: 3 days    Minutes of Exercise per Session: 20 min  Stress: No Stress Concern Present (07/06/2022)   [employer] of Occupational Health - Occupational Stress Questionnaire    Feeling of Stress : Not at all  Social Connections: Moderately Integrated (07/06/2022)   Social Connection and Isolation Panel [NHANES]    Frequency of Communication with Friends and Family: More than three times a week    Frequency of Social Gatherings with Friends and Family: Once a week    Attends Religious Services: More than 4 times per year    Active  Member of [employer] or Organizations: No    Attends Engineer, structural: Not on file    Marital Status: Married  Catering manager Violence: Not on file    Outpatient Medications Prior to Visit  Medication Sig Dispense Refill   azelastine (ASTELIN) 0.1 % nasal spray Place 1 spray into both nostrils 2 (two) times daily. Use in each nostril as directed 30 mL 12   CALCIUM PO Take by mouth as needed.     famotidine (PEPCID) 20 MG tablet Take 1 tablet (20 mg total) by mouth 2 (two) times daily. 180 tablet 3   fluticasone (FLONASE) 50 MCG/ACT nasal spray Place 2 sprays into both nostrils as needed for allergies or rhinitis. 16 g 5   Multiple Vitamin (MULTI VITAMIN DAILY PO) Take by mouth.     propranolol (INDERAL) 20 MG tablet 1 po as needed palpatations     Vitamin D, Ergocalciferol, (DRISDOL) 1.25 MG (50000 UNIT) CAPS capsule Take 1 capsule (50,000 Units total) by mouth every 7 (seven) days. 12 capsule 0   No facility-administered medications prior to visit.    Allergies  Allergen Reactions   Cetirizine & Related Palpitations    Per pt    Review of Systems  Constitutional:  Negative for fever (Felt feverish on Tuesday) and malaise/fatigue.  HENT:  Positive for sore throat. Negative for congestion.        + Right ear pressure  Eyes:  Negative for blurred vision.  Respiratory:  Positive for cough. Negative for shortness of breath.   Cardiovascular:  Negative for chest pain, palpitations and leg swelling.  Gastrointestinal:  Negative for abdominal pain, blood in stool and nausea.       + Dysphagia  Genitourinary:  Negative for dysuria and frequency.  Musculoskeletal:  Negative for falls.  Skin:  Positive for itching (Right ear, throat). Negative for rash.  Neurological:  Negative for dizziness, loss of consciousness and headaches.  Endo/Heme/Allergies:  Negative for environmental allergies.  Psychiatric/Behavioral:  Negative for depression. The patient is not nervous/anxious.         Objective:    Physical Exam Vitals and nursing note reviewed.  Constitutional:      Appearance: Normal appearance.  HENT:     Head: Normocephalic and atraumatic.     Right Ear: Ear canal and external ear normal. A middle ear effusion is present.     Left Ear: Tympanic membrane, ear canal and external ear normal.  No middle ear effusion.  Ears:     Comments: Right TM appears a little dull.    Mouth/Throat:     Mouth: Mucous membranes are moist.     Pharynx: Oropharyngeal exudate and posterior oropharyngeal erythema present.  Eyes:     Extraocular Movements: Extraocular movements intact.     Pupils: Pupils are equal, round, and reactive to light.  Cardiovascular:     Rate and Rhythm: Normal rate and regular rhythm.     Heart sounds: Normal heart sounds. No murmur heard.    No gallop.  Pulmonary:     Effort: Pulmonary effort is normal. No respiratory distress.     Breath sounds: Normal breath sounds. No wheezing or rales.  Lymphadenopathy:     Cervical: No cervical adenopathy.  Skin:    General: Skin is warm and dry.  Neurological:     General: No focal deficit present.     Mental Status: She is alert and oriented to person, place, and time.  Psychiatric:        Mood and Affect: Mood normal.        Behavior: Behavior normal.     BP 112/60 (BP Location: Left Arm, Patient Position: Sitting, Cuff Size: Normal)   Pulse 80   Temp 98.2 F (36.8 C) (Oral)   Resp 18   Ht 5\' 4"  (1.626 m)   Wt 163 lb 12.8 oz (74.3 kg)   SpO2 99%   BMI 28.12 kg/m  Wt Readings from Last 3 Encounters:  07/06/22 163 lb 12.8 oz (74.3 kg)  05/04/22 165 lb 6.4 oz (75 kg)  12/08/21 160 lb (72.6 kg)  Strep, flu, covid --- all negative   Diabetic Foot Exam - Simple   No data filed    Lab Results  Component Value Date   WBC 6.3 04/27/2022   HGB 13.1 04/27/2022   HCT 39.7 04/27/2022   PLT 206.0 04/27/2022   GLUCOSE 90 04/27/2022   CHOL 164 04/27/2022   TRIG 140.0 04/27/2022    HDL 35.70 (L) 04/27/2022   LDLCALC 100 (H) 04/27/2022   ALT 12 04/27/2022   AST 12 04/27/2022   NA 137 04/27/2022   K 4.6 04/27/2022   CL 104 04/27/2022   CREATININE 0.78 04/27/2022   BUN 11 04/27/2022   CO2 27 04/27/2022   TSH 1.41 04/27/2022   HGBA1C 5.3 03/03/2015    Lab Results  Component Value Date   TSH 1.41 04/27/2022   Lab Results  Component Value Date   WBC 6.3 04/27/2022   HGB 13.1 04/27/2022   HCT 39.7 04/27/2022   MCV 87.7 04/27/2022   PLT 206.0 04/27/2022   Lab Results  Component Value Date   NA 137 04/27/2022   K 4.6 04/27/2022   CO2 27 04/27/2022   GLUCOSE 90 04/27/2022   BUN 11 04/27/2022   CREATININE 0.78 04/27/2022   BILITOT 0.5 04/27/2022   ALKPHOS 52 04/27/2022   AST 12 04/27/2022   ALT 12 04/27/2022   PROT 6.8 04/27/2022   ALBUMIN 3.8 04/27/2022   CALCIUM 8.7 04/27/2022   GFR 91.12 04/27/2022   Lab Results  Component Value Date   CHOL 164 04/27/2022   Lab Results  Component Value Date   HDL 35.70 (L) 04/27/2022   Lab Results  Component Value Date   LDLCALC 100 (H) 04/27/2022   Lab Results  Component Value Date   TRIG 140.0 04/27/2022   Lab Results  Component Value Date   CHOLHDL 5 04/27/2022   Lab Results  Component Value Date   HGBA1C 5.3 03/03/2015       Assessment & Plan:   Problem List Items Addressed This Visit       Unprioritized   Pharyngitis - Primary    Amoxicillin 875 bid x 10 days  Con't antihistamine       Relevant Medications   amoxicillin (AMOXIL) 875 MG tablet   Other Relevant Orders   POCT Influenza A/B (Completed)   POCT rapid strep A (Completed)   POC COVID-19 (Completed)   Other Visit Diagnoses     Acute vaginitis       Relevant Medications   fluconazole (DIFLUCAN) 150 MG tablet        Meds ordered this encounter  Medications   amoxicillin (AMOXIL) 875 MG tablet    Sig: Take 1 tablet (875 mg total) by mouth 2 (two) times daily for 10 days.    Dispense:  20 tablet    Refill:  0    fluconazole (DIFLUCAN) 150 MG tablet    Sig: Take 1 tablet (150 mg total) by mouth daily. May repeat in 3 days if needed.    Dispense:  2 tablet    Refill:  1    I, Donato Schultz, DO, personally preformed the services described in this documentation.  All medical record entries made by the scribe were at my direction and in my presence.  I have reviewed the chart and discharge instructions (if applicable) and agree that the record reflects my personal performance and is accurate and complete. 07/06/2022.  I,Mathew Stumpf,acting as a Neurosurgeon for [employer], DO.,have documented all relevant documentation on the behalf of Donato Schultz, DO,as directed by  Donato Schultz, DO while in the presence of Donato Schultz, DO.   Donato Schultz, DO

## 2022-07-06 NOTE — Patient Instructions (Signed)

## 2022-07-06 NOTE — Assessment & Plan Note (Signed)
Amoxicillin 875 bid x 10 days  Con't antihistamine

## 2022-07-11 ENCOUNTER — Ambulatory Visit: Payer: BC Managed Care – PPO | Admitting: Gastroenterology

## 2022-07-11 ENCOUNTER — Encounter: Payer: Self-pay | Admitting: Gastroenterology

## 2022-07-11 VITALS — BP 112/74 | HR 82 | Ht 64.0 in | Wt 166.8 lb

## 2022-07-11 DIAGNOSIS — Z1211 Encounter for screening for malignant neoplasm of colon: Secondary | ICD-10-CM

## 2022-07-11 DIAGNOSIS — R1013 Epigastric pain: Secondary | ICD-10-CM | POA: Diagnosis not present

## 2022-07-11 DIAGNOSIS — R110 Nausea: Secondary | ICD-10-CM | POA: Diagnosis not present

## 2022-07-11 DIAGNOSIS — K760 Fatty (change of) liver, not elsewhere classified: Secondary | ICD-10-CM | POA: Diagnosis not present

## 2022-07-11 MED ORDER — NA SULFATE-K SULFATE-MG SULF 17.5-3.13-1.6 GM/177ML PO SOLN: ORAL | 0 refills | Status: DC

## 2022-07-11 MED ORDER — ONDANSETRON HCL 4 MG PO TABS: 4.0000 mg | ORAL_TABLET | Freq: Three times a day (TID) | ORAL | 0 refills | Status: DC | PRN

## 2022-07-11 NOTE — Progress Notes (Signed)
Rachel Wiley    [id number]    03/03/1975  Primary Care Physician:Lowne Almeta Monas Grayling Congress, DO  Referring Physician: Zola Button, Grayling Congress, DO [address] [place],  [place] [address]   Chief complaint: No chief complaint on file.   HPI: Rachel Wiley is a 47 y.o. female presenting to clinic today for consult for dyspepsia and to discuss  screening colonoscopy.   Today, we reviewed her recent fatty liver scan and discussed ways to reverse it including diet and lifestyle changes. She also reports suddenly gaining 10 pounds and wonders if that's a result of her fatty liver and not walking as much as prior. She's been taking a women's multivitamin daily.   She recalls one episode of pain/burning in her RLQ area that occurred last year, she though it was appendicitis but CT the abdomen pelvis was negative for acute appendicitis. She states that she has problems with experiencing occasional nausea.  She has intermittent dyspepsia symptoms epigastric discomfort  She denies any history of kidney stones or urinary tract infections.  She had cortical thinning in the kidney and findings suggestive of fatty liver  she denies diarrhea, constipation, blood in stool, black stool, vomiting, abdominal pain, bloating, unintentional weight loss, reflux, dysphagia.  GI Hx:  CT Abdomen Pelvis w contrast 12-08-21 There is no evidence of intestinal obstruction or pneumoperitoneum. Appendix is unremarkable. There is no hydronephrosis.  Fatty liver. There are foci of cortical thinning in the kidneys, more so in the right kidney suggesting scarring. There are multiple uterine fibroids.   Current Outpatient Medications:    amoxicillin (AMOXIL) 875 MG tablet, Take 1 tablet (875 mg total) by mouth 2 (two) times daily for 10 days., Disp: 20 tablet, Rfl: 0   azelastine (ASTELIN) 0.1 % nasal spray, Place 1 spray into both nostrils 2 (two) times daily. Use in each nostril as directed, Disp: 30  mL, Rfl: 12   CALCIUM PO, Take by mouth as needed., Disp: , Rfl:    famotidine (PEPCID) 20 MG tablet, Take 1 tablet (20 mg total) by mouth 2 (two) times daily., Disp: 180 tablet, Rfl: 3   fluconazole (DIFLUCAN) 150 MG tablet, Take 1 tablet (150 mg total) by mouth daily. May repeat in 3 days if needed., Disp: 2 tablet, Rfl: 1   fluticasone (FLONASE) 50 MCG/ACT nasal spray, Place 2 sprays into both nostrils as needed for allergies or rhinitis., Disp: 16 g, Rfl: 5   Multiple Vitamin (MULTI VITAMIN DAILY PO), Take by mouth., Disp: , Rfl:    propranolol (INDERAL) 20 MG tablet, 1 po as needed palpatations, Disp: , Rfl:    Vitamin D, Ergocalciferol, (DRISDOL) 1.25 MG (50000 UNIT) CAPS capsule, Take 1 capsule (50,000 Units total) by mouth every 7 (seven) days., Disp: 12 capsule, Rfl: 0   Allergies as of 07/11/2022 - Review Complete 07/06/2022  Allergen Reaction Noted   Cetirizine & related Palpitations 09/07/2015    Past Medical History:  Diagnosis Date   Allergy    mild   Anemia    Anxiety    Fibroid    GERD (gastroesophageal reflux disease)    Headache(784.0)    Lipoma of unspecified site    Other acute reactions to stress    Palpitations    SVD (spontaneous vaginal delivery) 06/18/2011   Tachycardia     Past Surgical History:  Procedure Laterality Date   NO PAST SURGERIES      Family  History  Problem Relation Age of Onset   Cancer Mother    Breast cancer Mother 14   Hypertension Mother    Hypertension Other    Anesthesia problems Neg Hx    Hypotension Neg Hx    Malignant hyperthermia Neg Hx    Pseudochol deficiency Neg Hx    Colon cancer Neg Hx    Colon polyps Neg Hx    Crohn's disease Neg Hx    Esophageal cancer Neg Hx    Rectal cancer Neg Hx    Stomach cancer Neg Hx    Ulcerative colitis Neg Hx     Social History   Socioeconomic History   Marital status: Married    Spouse name: Not on file   Number of children: 2   Years of education: Not on file   Highest  education level: Bachelor's degree (e.g., BA, AB, BS)  Occupational History    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  Tobacco Use   Smoking status: Never    Passive exposure: Past   Smokeless tobacco: Never  Vaping Use   Vaping Use: Never used  Substance and Sexual Activity   Alcohol use: No   Drug use: No   Sexual activity: Yes    Partners: Male    Birth control/protection: Condom  Other Topics Concern   Not on file  Social History Narrative   Never Smoked   Alcohol use-no   Drug use-no   Smoking Status:  never   Drug Use:  No   Exercise--- walking            Social Determinants of Health   Financial Resource Strain: Low Risk  (07/06/2022)   Overall Financial Resource Strain (CARDIA)    Difficulty of Paying Living Expenses: Not hard at all  Food Insecurity: No Food Insecurity (07/06/2022)   Hunger Vital Sign    Worried About Running Out of Food in the Last Year: Never true    Ran Out of Food in the Last Year: Never true  Transportation Needs: No Transportation Needs (07/06/2022)   PRAPARE - Administrator, Civil Service (Medical): No    Lack of Transportation (Non-Medical): No  Physical Activity: Insufficiently Active (07/06/2022)   Exercise Vital Sign    Days of Exercise per Week: 3 days    Minutes of Exercise per Session: 20 min  Stress: No Stress Concern Present (07/06/2022)   [employer] of Occupational Health - Occupational Stress Questionnaire    Feeling of Stress : Not at all  Social Connections: Moderately Integrated (07/06/2022)   Social Connection and Isolation Panel [NHANES]    Frequency of Communication with Friends and Family: More than three times a week    Frequency of Social Gatherings with Friends and Family: Once a week    Attends Religious Services: More than 4 times per year    Active Member of [employer] or Organizations: No    Attends Engineer, structural: Not on file    Marital Status: Married  Catering manager Violence: Not  on file      Review of systems: Review of Systems  Constitutional:  Positive for unexpected weight change (10 p gain).  HENT:  Negative for trouble swallowing.   Gastrointestinal:  Positive for nausea. Negative for abdominal distention, abdominal pain, anal bleeding, blood in stool, constipation, diarrhea, rectal pain and vomiting.      Physical Exam: Vitals:   07/11/22 0850  BP: 112/74  Pulse: 82   Body mass index  is 28.63 kg/m.  General: well-appearing   CV: s1s2 Lungs: clear Abd: soft NTND Eyes: sclera anicteric, no redness Skin; warm and dry, no rash or jaundice noted Neuro: awake, alert and oriented x 3. Normal gross motor function and fluent speech   Data Reviewed:  Reviewed labs, radiology imaging, old records and pertinent past GI work up   Assessment and Plan/Recommendations: 47 year old very pleasant female here with complaints of intermittent epigastric discomfort, dyspepsia symptoms We will plan to proceed with EGD to evaluate for possible H. pylori associated gastritis or peptic ulcer disease. Due for colorectal cancer screening, will schedule colonoscopy along with EGD The risks and benefits as well as alternatives of endoscopic procedure(s) have been discussed and reviewed. All questions answered. The patient agrees to proceed.  Fatty liver: Advised patient to continue with dietary changes, avoid high carb and high-fiber diet.  Include more fruits, vegetables and whole grains in diet.  Daily exercise/activity 20 to 30 minutes/day Start taking over-the-counter vitamin E 500IU daily    The patient was provided an opportunity to ask questions and all were answered. The patient agreed with the plan and demonstrated an understanding of the instructions.  Iona Beard , MD  CC: Donato Schultz, *   [place] M Kadhim,acting as a scribe for Marsa Aris, MD.,have documented all relevant documentation on the behalf of Marsa Aris, MD,as  directed by  Marsa Aris, MD while in the presence of Marsa Aris, MD.   I, Marsa Aris, MD, have reviewed all documentation for this visit. The documentation on 07/11/22 for the exam, diagnosis, procedures, and orders are all accurate and complete.

## 2022-07-11 NOTE — Patient Instructions (Addendum)
You have been scheduled for an endoscopy and colonoscopy. Please follow the written instructions given to you at your visit today. Please pick up your prep supplies at the pharmacy within the next 1-3 days. If you use inhalers (even only as needed), please bring them with you on the day of your procedure.   AVOID sugar and high carbohydrate diet  Take Vitamin E 500 IU Over the counter   We have sent the following medications to your pharmacy for you to pick up at your convenience:   Zofran   Due to recent changes in healthcare laws, you may see the results of your imaging and laboratory studies on MyChart before your provider has had a chance to review them.  We understand that in some cases there may be results that are confusing or concerning to you. Not all laboratory results come back in the same time frame and the provider may be waiting for multiple results in order to interpret others.  Please give [place] 48 hours in order for your provider to thoroughly review all the results before contacting the office for clarification of your results.   _______________________________________________________  If your blood pressure at your visit was 140/90 or greater, please contact your primary care physician to follow up on this.  _______________________________________________________  If you are age 38 or older, your body mass index should be between 23-30. Your Body mass index is 28.63 kg/m. If this is out of the aforementioned range listed, please consider follow up with your Primary Care Provider.  If you are age 60 or younger, your body mass index should be between 19-25. Your Body mass index is 28.63 kg/m. If this is out of the aformentioned range listed, please consider follow up with your Primary Care Provider.   ________________________________________________________  The [facility] providers would like to encourage you to use [facility] to communicate with providers for non-urgent  requests or questions.  Due to long hold times on the telephone, sending your provider a message by [facility] may be a faster and more efficient way to get a response.  Please allow 48 business hours for a response.  Please remember that this is for non-urgent requests.  _______________________________________________________    I appreciate the  opportunity to care for you  Thank You   Marsa Aris , MD

## 2022-07-20 ENCOUNTER — Encounter: Payer: Self-pay | Admitting: Gastroenterology

## 2022-09-21 DIAGNOSIS — H93291 Other abnormal auditory perceptions, right ear: Secondary | ICD-10-CM | POA: Diagnosis not present

## 2022-09-21 DIAGNOSIS — R42 Dizziness and giddiness: Secondary | ICD-10-CM | POA: Diagnosis not present

## 2022-10-02 DIAGNOSIS — R42 Dizziness and giddiness: Secondary | ICD-10-CM | POA: Diagnosis not present

## 2022-10-10 DIAGNOSIS — R42 Dizziness and giddiness: Secondary | ICD-10-CM | POA: Diagnosis not present

## 2022-10-10 DIAGNOSIS — H9201 Otalgia, right ear: Secondary | ICD-10-CM | POA: Diagnosis not present

## 2022-11-09 ENCOUNTER — Encounter: Payer: BC Managed Care – PPO | Admitting: Gastroenterology

## 2022-12-10 ENCOUNTER — Other Ambulatory Visit: Payer: Self-pay | Admitting: Family Medicine

## 2022-12-10 DIAGNOSIS — R0982 Postnasal drip: Secondary | ICD-10-CM

## 2023-01-01 ENCOUNTER — Encounter (INDEPENDENT_AMBULATORY_CARE_PROVIDER_SITE_OTHER): Payer: Self-pay

## 2023-01-01 ENCOUNTER — Ambulatory Visit (INDEPENDENT_AMBULATORY_CARE_PROVIDER_SITE_OTHER): Payer: BC Managed Care – PPO

## 2023-01-01 VITALS — Ht 64.0 in | Wt 160.0 lb

## 2023-01-01 DIAGNOSIS — H8101 Meniere's disease, right ear: Secondary | ICD-10-CM

## 2023-01-01 DIAGNOSIS — R42 Dizziness and giddiness: Secondary | ICD-10-CM

## 2023-01-01 MED ORDER — MECLIZINE HCL 25 MG PO CHEW: 1.0000 | CHEWABLE_TABLET | Freq: Four times a day (QID) | ORAL | 3 refills | Status: AC | PRN

## 2023-01-03 DIAGNOSIS — R42 Dizziness and giddiness: Secondary | ICD-10-CM | POA: Insufficient documentation

## 2023-01-03 NOTE — Progress Notes (Signed)
Patient ID: Rachel Wiley, female   DOB: 1975-07-22, 47 y.o.   MRN: [id number]  Follow-up: Recurrent dizziness, right ear pressure  HPI: The patient is a 47 year old female who returns today for her follow-up evaluation.  The patient was previously seen for recurrent dizziness and right ear pressure.  She has also noted intermittent pulsatile right ear tinnitus.  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  She subsequently underwent vestibular neurodiagnostic testing.  The results showed abnormal electrocochleography in the right ear, suggesting an elevated labyrinthine pressure or endolymphatic hydrops in the right ear.  The patient was treated with a 1500 mg low-salt diet.  The patient returns today reporting 1 episode of mild dizziness 3 months ago.  She denies any recent change in her hearing.  Currently she denies any otalgia or otorrhea.  Exam: General: Communicates without difficulty, well nourished, no acute distress. Head: Normocephalic, no evidence injury, no tenderness, facial buttresses intact without stepoff. Eyes: PERRL, EOMI. No scleral icterus, conjunctivae clear. Neuro: CN II exam reveals vision grossly intact.  No nystagmus at any point of gaze. Ears: Auricles well formed without lesions.  Ear canals are intact without mass or lesion.  No erythema or edema is appreciated.  The TMs are intact without fluid. Nose: External evaluation reveals normal support and skin without lesions.  Dorsum is intact.  Anterior rhinoscopy reveals healthy pink mucosa over anterior aspect of inferior turbinates and intact septum.  No purulence noted. Oral:  Oral cavity and oropharynx are intact, symmetric, without erythema or edema.  Mucosa is moist without lesions. Neck: Full range of motion without pain.  There is no significant lymphadenopathy.  No masses palpable.  Thyroid bed within normal limits to palpation.  Parotid glands and submandibular glands equal bilaterally without mass.  Trachea  is midline. Neuro:  CN 2-12 grossly intact. Gait normal. Vestibular: No nystagmus at any point of gaze. Vestibular: There is no nystagmus with pneumatic pressure on either tympanic membrane or Valsalva.  The cerebellar examination is unremarkable.   Assessment: 1.  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  She is currently asymptomatic. 2.  The patient's ear canals, tympanic membranes, and middle ear spaces are normal.  Plan: 1.  The physical exam findings are reviewed with the patient. 2.  The pathophysiology of Mnire's disease and dizziness are again discussed with the patient.  Questions are invited and answered. 3.  Continue with a 1500 mg low-salt diet. 4.  The patient will return for reevaluation in 6 months.

## 2023-03-07 ENCOUNTER — Other Ambulatory Visit: Payer: Self-pay | Admitting: Family Medicine

## 2023-03-07 DIAGNOSIS — R0982 Postnasal drip: Secondary | ICD-10-CM

## 2023-03-12 LAB — LAB REPORT - SCANNED: PTH: 45

## 2023-03-21 ENCOUNTER — Other Ambulatory Visit: Payer: Self-pay | Admitting: Family Medicine

## 2023-03-21 DIAGNOSIS — R0982 Postnasal drip: Secondary | ICD-10-CM

## 2023-04-06 ENCOUNTER — Other Ambulatory Visit: Payer: Self-pay | Admitting: Family Medicine

## 2023-04-06 DIAGNOSIS — R0982 Postnasal drip: Secondary | ICD-10-CM

## 2023-04-29 ENCOUNTER — Encounter: Payer: Self-pay | Admitting: Family Medicine

## 2023-04-30 ENCOUNTER — Other Ambulatory Visit: Payer: Self-pay | Admitting: Family Medicine

## 2023-04-30 ENCOUNTER — Telehealth: Payer: Self-pay | Admitting: *Deleted

## 2023-04-30 DIAGNOSIS — Z0000 Encounter for general adult medical examination without abnormal findings: Secondary | ICD-10-CM

## 2023-04-30 NOTE — Telephone Encounter (Signed)
 Patient has lab appointment on 05/06/23 but we have no orders.   Need lab orders.

## 2023-05-01 NOTE — Addendum Note (Signed)
 Addended by: Roxanne Gates on: 05/01/2023 08:38 AM   Modules accepted: Orders

## 2023-05-06 ENCOUNTER — Other Ambulatory Visit: Payer: BC Managed Care – PPO

## 2023-05-07 ENCOUNTER — Other Ambulatory Visit (INDEPENDENT_AMBULATORY_CARE_PROVIDER_SITE_OTHER)

## 2023-05-07 DIAGNOSIS — Z0000 Encounter for general adult medical examination without abnormal findings: Secondary | ICD-10-CM | POA: Diagnosis not present

## 2023-05-07 LAB — COMPREHENSIVE METABOLIC PANEL
ALT: 13 U/L (ref 0–35)
AST: 12 U/L (ref 0–37)
Albumin: 4.2 g/dL (ref 3.5–5.2)
Alkaline Phosphatase: 56 U/L (ref 39–117)
BUN: 11 mg/dL (ref 6–23)
CO2: 27 meq/L (ref 19–32)
Calcium: 9.2 mg/dL (ref 8.4–10.5)
Chloride: 104 meq/L (ref 96–112)
Creatinine, Ser: 0.87 mg/dL (ref 0.40–1.20)
GFR: 79.36 mL/min (ref >60.00)
Glucose, Bld: 95 mg/dL (ref 70–99)
Potassium: 4.5 meq/L (ref 3.5–5.1)
Sodium: 137 meq/L (ref 135–145)
Total Bilirubin: 0.6 mg/dL (ref 0.2–1.2)
Total Protein: 7.2 g/dL (ref 6.0–8.3)

## 2023-05-07 LAB — LIPID PANEL
Cholesterol: 166 mg/dL (ref 0–200)
HDL: 38.6 mg/dL — ABNORMAL LOW (ref >39.00)
LDL Cholesterol: 98 mg/dL (ref 0–99)
NonHDL: 127.47
Total CHOL/HDL Ratio: 4
Triglycerides: 147 mg/dL (ref 0.0–149.0)
VLDL: 29.4 mg/dL (ref 0.0–40.0)

## 2023-05-07 LAB — CBC WITH DIFFERENTIAL/PLATELET
Basophils Absolute: 0 10*3/uL (ref 0.0–0.1)
Basophils Relative: 0.4 % (ref 0.0–3.0)
Eosinophils Absolute: 0.4 10*3/uL (ref 0.0–0.7)
Eosinophils Relative: 4.3 % (ref 0.0–5.0)
HCT: 41.3 % (ref 36.0–46.0)
Hemoglobin: 13.7 g/dL (ref 12.0–15.0)
Lymphocytes Relative: 28.5 % (ref 12.0–46.0)
Lymphs Abs: 2.4 10*3/uL (ref 0.7–4.0)
MCHC: 33.2 g/dL (ref 30.0–36.0)
MCV: 87.1 fl (ref 78.0–100.0)
Monocytes Absolute: 0.6 10*3/uL (ref 0.1–1.0)
Monocytes Relative: 7.3 % (ref 3.0–12.0)
Neutro Abs: 5.1 10*3/uL (ref 1.4–7.7)
Neutrophils Relative %: 59.5 % (ref 43.0–77.0)
Platelets: 172 10*3/uL (ref 150.0–400.0)
RBC: 4.74 Mil/uL (ref 3.87–5.11)
RDW: 14.5 % (ref 11.5–15.5)
WBC: 8.5 10*3/uL (ref 4.0–10.5)

## 2023-05-07 LAB — VITAMIN B12: Vitamin B-12: 364 pg/mL (ref 211–911)

## 2023-05-07 LAB — VITAMIN D 25 HYDROXY (VIT D DEFICIENCY, FRACTURES): VITD: 27.54 ng/mL — ABNORMAL LOW (ref 30.00–100.00)

## 2023-05-07 LAB — TSH: TSH: 1.8 u[IU]/mL (ref 0.35–5.50)

## 2023-05-09 ENCOUNTER — Encounter: Payer: BC Managed Care – PPO | Admitting: Family Medicine

## 2023-05-13 ENCOUNTER — Encounter: Payer: Self-pay | Admitting: Family Medicine

## 2023-05-13 ENCOUNTER — Other Ambulatory Visit (HOSPITAL_BASED_OUTPATIENT_CLINIC_OR_DEPARTMENT_OTHER): Payer: Self-pay | Admitting: Family Medicine

## 2023-05-13 ENCOUNTER — Ambulatory Visit (INDEPENDENT_AMBULATORY_CARE_PROVIDER_SITE_OTHER): Payer: BC Managed Care – PPO | Admitting: Family Medicine

## 2023-05-13 VITALS — BP 110/80 | HR 76 | Temp 98.3°F | Resp 14 | Ht 64.0 in | Wt 160.0 lb

## 2023-05-13 DIAGNOSIS — Z0000 Encounter for general adult medical examination without abnormal findings: Secondary | ICD-10-CM | POA: Diagnosis not present

## 2023-05-13 DIAGNOSIS — Z1231 Encounter for screening mammogram for malignant neoplasm of breast: Secondary | ICD-10-CM

## 2023-05-13 DIAGNOSIS — E559 Vitamin D deficiency, unspecified: Secondary | ICD-10-CM

## 2023-05-13 DIAGNOSIS — R0982 Postnasal drip: Secondary | ICD-10-CM | POA: Diagnosis not present

## 2023-05-13 MED ORDER — AZELASTINE HCL 137 MCG/SPRAY NA SOLN: 1.0000 | Freq: Two times a day (BID) | NASAL | 2 refills | Status: AC | PRN

## 2023-05-13 MED ORDER — VITAMIN D (ERGOCALCIFEROL) 1.25 MG (50000 UNIT) PO CAPS: 50000.0000 [IU] | ORAL_CAPSULE | ORAL | 0 refills | Status: DC

## 2023-05-13 NOTE — Progress Notes (Signed)
 Established Patient Office Visit  Subjective   Patient ID: Rachel Wiley, female    DOB: February 02, 1976  Age: 48 y.o. MRN: [id number]  Chief Complaint  Patient presents with   Annual Exam    HPI Discussed the use of AI scribe software for clinical note transcription with the patient, who gave verbal consent to proceed.  History of Present Illness   Coley Habermehl is a 48 year old female with Meniere's disease who presents for follow-up on her condition and vitamin D deficiency.  She has a history of Meniere's disease diagnosed in her right ear in September 2023. There is no specific medication for her condition, and she manages it with a low sodium diet, aiming for 800 mg or less per day. Consuming meals with high salt content, such as five to six meals outside, triggers spinning episodes. She has been using low sodium salt, which has helped reduce head numbness significantly. Vestibular testing at her ENT's office indicated issues related to sodium levels. She experienced a spinning attack in 2023, which prompted further evaluation. Initially, her symptoms had improved, but the recurrence led to additional testing. She has lost eight pounds since January 2025, which she attributes to dietary changes, including reduced salt intake.  She is concerned about her vitamin D levels, which are low. She is not currently taking a prescription for it but uses over-the-counter vitamin B3. She feels tired, which may be related to the deficiency.  She mentions her nails have become whitish, particularly two nails, which she finds unusual. She does not frequently use nail polish and performs her nail care at home.  Regarding her menstrual history, she experiences regular periods lasting two to three days, with some discharge before the onset. She occasionally experiences cramps and back pain during ovulation but does not take medication for these symptoms. She recalls a past episode of severe right-sided pain during  her period, which resolved in 30 minutes and was evaluated with a CT scan, showing no significant findings.  She has no significant family history or changes in her medical history. She is up to date with her dental and eye check-ups but missed her eye appointment last year. She plans to schedule an endoscopy and colonoscopy with Dr. Caryl Comes.      Patient Active Problem List   Diagnosis Date Noted   Dizziness 01/03/2023   RLQ abdominal pain 12/08/2021   Dyspepsia 12/08/2021   PND (post-nasal drip) 04/28/2021   Cervical strain 04/28/2021   Colon cancer screening 04/28/2021   Preventative health care 04/28/2021   Abnormal bleeding in menstrual cycle 04/28/2021   Exposure to the flu 11/30/2020   Pharyngitis 11/30/2020   Otitis of right ear 09/20/2020   Infected tooth 09/20/2020   Bunion, right foot 09/01/2019   Bilateral carpal tunnel syndrome 09/01/2019   Nail abnormalities 09/01/2019   Right knee pain 11/11/2016   Vaginitis and vulvovaginitis 03/03/2015   Plantar fasciitis 05/15/2012   Pre-ulcerative corn or callous 05/15/2012   External hemorrhoid, bleeding 11/28/2011   Anal fissure 11/28/2011   SVD (spontaneous vaginal delivery) 06/18/2011   Tachycardia 03/15/2011   URI (upper respiratory infection) 02/07/2011   Lipoma 04/28/2010   OTHER ACUTE REACTIONS TO STRESS 04/28/2010   Headache 04/28/2010   PALPITATIONS, RECURRENT 04/28/2010   Past Medical History:  Diagnosis Date   Allergy    mild   Anemia    Anxiety    Fibroid    GERD (gastroesophageal reflux disease)    Headache(784.0)  Lipoma of unspecified site    Other acute reactions to stress    Palpitations    SVD (spontaneous vaginal delivery) 06/18/2011   Tachycardia    Past Surgical History:  Procedure Laterality Date   NO PAST SURGERIES     Social History   Tobacco Use   Smoking status: Never    Passive exposure: Past   Smokeless tobacco: Never  Vaping Use   Vaping status: Never Used  Substance  Use Topics   Alcohol use: No   Drug use: No   Social History   Socioeconomic History   Marital status: Married    Spouse name: Not on file   Number of children: 2   Years of education: Not on file   Highest education level: Bachelor's degree (e.g., BA, AB, BS)  Occupational History    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  Tobacco Use   Smoking status: Never    Passive exposure: Past   Smokeless tobacco: Never  Vaping Use   Vaping status: Never Used  Substance and Sexual Activity   Alcohol use: No   Drug use: No   Sexual activity: Yes    Partners: Male    Birth control/protection: Condom  Other Topics Concern   Not on file  Social History Narrative   Never Smoked   Alcohol use-no   Drug use-no   Smoking Status:  never   Drug Use:  No   Exercise--- walking            Social Drivers of Health   Financial Resource Strain: Low Risk  (05/10/2023)   Overall Financial Resource Strain (CARDIA)    Difficulty of Paying Living Expenses: Not hard at all  Food Insecurity: No Food Insecurity (05/10/2023)   Hunger Vital Sign    Worried About Running Out of Food in the Last Year: Never true    Ran Out of Food in the Last Year: Never true  Transportation Needs: No Transportation Needs (05/10/2023)   PRAPARE - Administrator, Civil Service (Medical): No    Lack of Transportation (Non-Medical): No  Physical Activity: Insufficiently Active (05/10/2023)   Exercise Vital Sign    Days of Exercise per Week: 4 days    Minutes of Exercise per Session: 30 min  Stress: No Stress Concern Present (05/10/2023)   [employer] of Occupational Health - Occupational Stress Questionnaire    Feeling of Stress : Not at all  Social Connections: Moderately Integrated (05/10/2023)   Social Connection and Isolation Panel [NHANES]    Frequency of Communication with Friends and Family: More than three times a week    Frequency of Social Gatherings with Friends and Family: Once a week     Attends Religious Services: More than 4 times per year    Active Member of [employer] or Organizations: No    Attends Engineer, structural: Not on file    Marital Status: Married  Catering manager Violence: Not on file   Family Status  Relation Name Status   Mother  Alive   Father  Deceased at age 16       pneumonia   MGM  Deceased   MGF  Deceased   PGM  Deceased   PGF  Deceased   Other  (Not Specified)   Neg Hx  (Not Specified)  No partnership data on file   Family History  Problem Relation Age of Onset   Cancer Mother    Breast cancer Mother 23  Hypertension Mother    Hypertension Other    Anesthesia problems Neg Hx    Hypotension Neg Hx    Malignant hyperthermia Neg Hx    Pseudochol deficiency Neg Hx    Colon cancer Neg Hx    Colon polyps Neg Hx    Crohn's disease Neg Hx    Esophageal cancer Neg Hx    Rectal cancer Neg Hx    Stomach cancer Neg Hx    Ulcerative colitis Neg Hx    Allergies  Allergen Reactions   Cetirizine & Related Palpitations    Per pt      ROS    Objective:     BP 110/80 (BP Location: Left Arm, Patient Position: Sitting, Cuff Size: Normal)   Pulse 76   Temp 98.3 F (36.8 C) (Oral)   Resp 14   Ht 5\' 4"  (1.626 m)   Wt 160 lb (72.6 kg)   LMP 04/16/2023   SpO2 99%   BMI 27.46 kg/m  BP Readings from Last 3 Encounters:  05/13/23 110/80  07/11/22 112/74  07/06/22 112/60   Wt Readings from Last 3 Encounters:  05/13/23 160 lb (72.6 kg)  01/01/23 160 lb (72.6 kg)  07/11/22 166 lb 12.8 oz (75.7 kg)   SpO2 Readings from Last 3 Encounters:  05/13/23 99%  07/06/22 99%  05/04/22 99%      Physical Exam   No results found for any visits on 05/13/23.  Last CBC Lab Results  Component Value Date   WBC 8.5 05/07/2023   HGB 13.7 05/07/2023   HCT 41.3 05/07/2023   MCV 87.1 05/07/2023   MCH 26.3 (L) 04/20/2016   RDW 14.5 05/07/2023   PLT 172.0 05/07/2023   Last metabolic panel Lab Results  Component Value Date    GLUCOSE 95 05/07/2023   NA 137 05/07/2023   K 4.5 05/07/2023   CL 104 05/07/2023   CO2 27 05/07/2023   BUN 11 05/07/2023   CREATININE 0.87 05/07/2023   GFR 79.36 05/07/2023   CALCIUM 9.2 05/07/2023   PROT 7.2 05/07/2023   ALBUMIN 4.2 05/07/2023   BILITOT 0.6 05/07/2023   ALKPHOS 56 05/07/2023   AST 12 05/07/2023   ALT 13 05/07/2023   Last lipids Lab Results  Component Value Date   CHOL 166 05/07/2023   HDL 38.60 (L) 05/07/2023   LDLCALC 98 05/07/2023   TRIG 147.0 05/07/2023   CHOLHDL 4 05/07/2023   Last hemoglobin A1c Lab Results  Component Value Date   HGBA1C 5.3 03/03/2015   Last thyroid functions Lab Results  Component Value Date   TSH 1.80 05/07/2023   T4TOTAL 8.5 09/23/2014   Last vitamin D Lab Results  Component Value Date   VD25OH 27.54 (L) 05/07/2023   Last vitamin B12 and Folate Lab Results  Component Value Date   VITAMINB12 364 05/07/2023      The 10-year ASCVD risk score (Arnett DK, et al., 2019) is: 0.9%    Assessment & Plan:   Problem List Items Addressed This Visit       Unprioritized   PND (post-nasal drip)   Relevant Medications   Azelastine HCl 137 MCG/SPRAY SOLN   Preventative health care - Primary   Ghm utd Labs reviewed with pt  See AVS Health Maintenance  Topic Date Due   Colonoscopy  Never done   COVID-19 Vaccine (4 - 2024-25 season) 10/14/2022   MAMMOGRAM  05/08/2023   Cervical Cancer Screening (HPV/Pap Cotest)  04/28/2024   DTaP/Tdap/Td (5 -  Td or Tdap) 05/03/2032   INFLUENZA VACCINE  Completed   Hepatitis C Screening  Completed   HIV Screening  Completed   HPV VACCINES  Aged Out         Other Visit Diagnoses       Vitamin D deficiency       Relevant Medications   Vitamin D, Ergocalciferol, (DRISDOL) 1.25 MG (50000 UNIT) CAPS capsule     Assessment and Plan    Meniere's disease   Meniere's disease in the right ear was diagnosed in September 2023. Symptoms include vertigo, which is managed with a low  sodium diet. Dietary changes have led to symptomatic improvement, though occasional vertigo occurs with high sodium intake. An eight-pound weight loss since January is possibly due to the low sodium diet, which may also aid in weight loss by reducing fluid retention. Continue the low sodium diet (800 mg or less) and return if vertigo recurs.  Vitamin D deficiency   She has a vitamin D deficiency with no current prescription and is taking over-the-counter vitamin B3. Vitamin D supplementation is planned to address the deficiency, with a prescription to be taken once a week for three months, followed by over-the-counter vitamin D. Rechecking vitamin D levels in six months is optional unless fatigue persists. Prescribe vitamin D supplementation once a week for three months and continue over-the-counter vitamin B3. Recheck vitamin D levels in six months if fatigue persists.  Nail fungus   Whitish discoloration of two nails suggests a fungal infection. Over-the-counter antifungal treatment is recommended initially. Prescription treatments are available, but oral antifungal medications are avoided due to potential hepatotoxicity. Use over-the-counter antifungal treatment for nails and consider further treatment if symptoms persist.  General Health Maintenance   Immunizations are up to date. Regular dental and ophthalmologic visits are maintained, although an eye appointment was missed last year. Plans to attend this year. A mammogram was completed last year. Plans to schedule a colonoscopy and endoscopy with Dr. Caryl Comes. Schedule colonoscopy and endoscopy with Dr. Caryl Comes and attend regular ophthalmologic appointments.        Return if symptoms worsen or fail to improve.    Donato Schultz, DO

## 2023-05-13 NOTE — Assessment & Plan Note (Signed)
 Ghm utd Labs reviewed with pt  See AVS Health Maintenance  Topic Date Due   Colonoscopy  Never done   COVID-19 Vaccine (4 - 2024-25 season) 10/14/2022   MAMMOGRAM  05/08/2023   Cervical Cancer Screening (HPV/Pap Cotest)  04/28/2024   DTaP/Tdap/Td (5 - Td or Tdap) 05/03/2032   INFLUENZA VACCINE  Completed   Hepatitis C Screening  Completed   HIV Screening  Completed   HPV VACCINES  Aged Out

## 2023-05-13 NOTE — Patient Instructions (Signed)
 Preventive Care 16-48 Years Old, Female  Preventive care refers to lifestyle choices and visits with your health care provider that can promote health and wellness. Preventive care visits are also called wellness exams.  What can I expect for my preventive care visit?  Counseling  Your health care provider may ask you questions about your:  Medical history, including:  Past medical problems.  Family medical history.  Pregnancy history.  Current health, including:  Menstrual cycle.  Method of birth control.  Emotional well-being.  Home life and relationship well-being.  Sexual activity and sexual health.  Lifestyle, including:  Alcohol, nicotine or tobacco, and drug use.  Access to firearms.  Diet, exercise, and sleep habits.  Work and work Astronomer.  Sunscreen use.  Safety issues such as seatbelt and bike helmet use.  Physical exam  Your health care provider will check your:  Height and weight. These may be used to calculate your BMI (body mass index). BMI is a measurement that tells if you are at a healthy weight.  Waist circumference. This measures the distance around your waistline. This measurement also tells if you are at a healthy weight and may help predict your risk of certain diseases, such as type 2 diabetes and high blood pressure.  Heart rate and blood pressure.  Body temperature.  Skin for abnormal spots.  What immunizations do I need?    Vaccines are usually given at various ages, according to a schedule. Your health care provider will recommend vaccines for you based on your age, medical history, and lifestyle or other factors, such as travel or where you work.  What tests do I need?  Screening  Your health care provider may recommend screening tests for certain conditions. This may include:  Lipid and cholesterol levels.  Diabetes screening. This is done by checking your blood sugar (glucose) after you have not eaten for a while (fasting).  Pelvic exam and Pap test.  Hepatitis B test.  Hepatitis C  test.  HIV (human immunodeficiency virus) test.  STI (sexually transmitted infection) testing, if you are at risk.  Lung cancer screening.  Colorectal cancer screening.  Mammogram. Talk with your health care provider about when you should start having regular mammograms. This may depend on whether you have a family history of breast cancer.  BRCA-related cancer screening. This may be done if you have a family history of breast, ovarian, tubal, or peritoneal cancers.  Bone density scan. This is done to screen for osteoporosis.  Talk with your health care provider about your test results, treatment options, and if necessary, the need for more tests.  Follow these instructions at home:  Eating and drinking    Eat a diet that includes fresh fruits and vegetables, whole grains, lean protein, and low-fat dairy products.  Take vitamin and mineral supplements as recommended by your health care provider.  Do not drink alcohol if:  Your health care provider tells you not to drink.  You are pregnant, may be pregnant, or are planning to become pregnant.  If you drink alcohol:  Limit how much you have to 0-1 drink a day.  Know how much alcohol is in your drink. In the [place] one drink equals one 12 oz bottle of beer (355 mL), one 5 oz glass of wine (148 mL), or one 1 oz glass of hard liquor (44 mL).  Lifestyle  Brush your teeth every morning and night with fluoride toothpaste. Floss one time each day.  Exercise for at least  30 minutes 5 or more days each week.  Do not use any products that contain nicotine or tobacco. These products include cigarettes, chewing tobacco, and vaping devices, such as e-cigarettes. If you need help quitting, ask your health care provider.  Do not use drugs.  If you are sexually active, practice safe sex. Use a condom or other form of protection to prevent STIs.  If you do not wish to become pregnant, use a form of birth control. If you plan to become pregnant, see your health care provider for a  prepregnancy visit.  Take aspirin only as told by your health care provider. Make sure that you understand how much to take and what form to take. Work with your health care provider to find out whether it is safe and beneficial for you to take aspirin daily.  Find healthy ways to manage stress, such as:  Meditation, yoga, or listening to music.  Journaling.  Talking to a trusted person.  Spending time with friends and family.  Minimize exposure to UV radiation to reduce your risk of skin cancer.  Safety  Always wear your seat belt while driving or riding in a vehicle.  Do not drive:  If you have been drinking alcohol. Do not ride with someone who has been drinking.  When you are tired or distracted.  While texting.  If you have been using any mind-altering substances or drugs.  Wear a helmet and other protective equipment during sports activities.  If you have firearms in your house, make sure you follow all gun safety procedures.  Seek help if you have been physically or sexually abused.  What's next?  Visit your health care provider once a year for an annual wellness visit.  Ask your health care provider how often you should have your eyes and teeth checked.  Stay up to date on all vaccines.  This information is not intended to replace advice given to you by your health care provider. Make sure you discuss any questions you have with your health care provider.  Document Revised: 07/27/2020 Document Reviewed: 07/27/2020  Elsevier Patient Education  2024 [employer].

## 2023-05-15 ENCOUNTER — Encounter: Payer: Self-pay | Admitting: Family Medicine

## 2023-05-17 ENCOUNTER — Encounter: Payer: Self-pay | Admitting: Family Medicine

## 2023-05-28 ENCOUNTER — Encounter: Payer: Self-pay | Admitting: Family Medicine

## 2023-05-28 ENCOUNTER — Ambulatory Visit: Admitting: Family Medicine

## 2023-05-28 VITALS — BP 100/80 | HR 80 | Temp 98.6°F | Resp 18 | Ht 64.0 in | Wt 160.2 lb

## 2023-05-28 DIAGNOSIS — J020 Streptococcal pharyngitis: Secondary | ICD-10-CM | POA: Insufficient documentation

## 2023-05-28 DIAGNOSIS — J029 Acute pharyngitis, unspecified: Secondary | ICD-10-CM | POA: Diagnosis not present

## 2023-05-28 LAB — POCT RAPID STREP A (OFFICE): Rapid Strep A Screen: POSITIVE — AB

## 2023-05-28 MED ORDER — FLUCONAZOLE 150 MG PO TABS: 150.0000 mg | ORAL_TABLET | Freq: Every day | ORAL | 0 refills | Status: DC

## 2023-05-28 MED ORDER — AMOXICILLIN-POT CLAVULANATE 875-125 MG PO TABS: 1.0000 | ORAL_TABLET | Freq: Two times a day (BID) | ORAL | 0 refills | Status: DC

## 2023-05-28 NOTE — Patient Instructions (Signed)

## 2023-05-28 NOTE — Progress Notes (Signed)
 Established Patient Office Visit  Subjective   Patient ID: Rachel Wiley, female    DOB: Sep 30, 1975  Age: 48 y.o. MRN: [id number]  Chief Complaint  Patient presents with   Sore Throat    Sxs started last Wed, pt states having sore throat, some pain with swallowing, prod cough, pt using tylenol and nasal sprays    HPI Discussed the use of AI scribe software for clinical note transcription with the patient, who gave verbal consent to proceed.  History of Present Illness Rachel Wiley is a 48 year old female who presents with a sore throat and ear congestion.  Her symptoms began last Wednesday with a sore throat and ear congestion. No fever is present, but she has a cough with mucus production. The sore throat was particularly painful yesterday, especially when swallowing, but has since improved. The ear congestion is described as a 'full' sensation, similar to being underwater, primarily affecting one ear.  She has been taking Tylenol twice for pain relief and uses Fluticasone nasal spray twice a day, which provides some relief. She is allergic to antihistamines like Zyrtec and Xyzal and has experienced increased heart rate with Citrucel.  She has a history of developing yeast infections when taking amoxicillin.   Patient Active Problem List   Diagnosis Date Noted   Strep throat 05/28/2023   Dizziness 01/03/2023   RLQ abdominal pain 12/08/2021   Dyspepsia 12/08/2021   PND (post-nasal drip) 04/28/2021   Cervical strain 04/28/2021   Colon cancer screening 04/28/2021   Preventative health care 04/28/2021   Abnormal bleeding in menstrual cycle 04/28/2021   Exposure to the flu 11/30/2020   Pharyngitis 11/30/2020   Otitis of right ear 09/20/2020   Infected tooth 09/20/2020   Bunion, right foot 09/01/2019   Bilateral carpal tunnel syndrome 09/01/2019   Nail abnormalities 09/01/2019   Right knee pain 11/11/2016   Vaginitis and vulvovaginitis 03/03/2015   Plantar fasciitis 05/15/2012    Pre-ulcerative corn or callous 05/15/2012   External hemorrhoid, bleeding 11/28/2011   Anal fissure 11/28/2011   SVD (spontaneous vaginal delivery) 06/18/2011   Tachycardia 03/15/2011   URI (upper respiratory infection) 02/07/2011   Lipoma 04/28/2010   OTHER ACUTE REACTIONS TO STRESS 04/28/2010   Headache 04/28/2010   PALPITATIONS, RECURRENT 04/28/2010   Past Medical History:  Diagnosis Date   Allergy    mild   Anemia    Anxiety    Fibroid    GERD (gastroesophageal reflux disease)    Headache(784.0)    Lipoma of unspecified site    Other acute reactions to stress    Palpitations    SVD (spontaneous vaginal delivery) 06/18/2011   Tachycardia    Past Surgical History:  Procedure Laterality Date   NO PAST SURGERIES     Social History   Tobacco Use   Smoking status: Never    Passive exposure: Past   Smokeless tobacco: Never  Vaping Use   Vaping status: Never Used  Substance Use Topics   Alcohol use: No   Drug use: No   Social History   Socioeconomic History   Marital status: Married    Spouse name: Not on file   Number of children: 2   Years of education: Not on file   Highest education level: Bachelor's degree (e.g., BA, AB, BS)  Occupational History    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  Tobacco Use   Smoking status: Never    Passive exposure: Past   Smokeless tobacco: Never  Vaping Use  Vaping status: Never Used  Substance and Sexual Activity   Alcohol use: No   Drug use: No   Sexual activity: Yes    Partners: Male    Birth control/protection: Condom  Other Topics Concern   Not on file  Social History Narrative   Never Smoked   Alcohol use-no   Drug use-no   Smoking Status:  never   Drug Use:  No   Exercise--- walking            Social Drivers of Health   Financial Resource Strain: Low Risk  (05/10/2023)   Overall Financial Resource Strain (CARDIA)    Difficulty of Paying Living Expenses: Not hard at all  Food Insecurity: No Food  Insecurity (05/10/2023)   Hunger Vital Sign    Worried About Running Out of Food in the Last Year: Never true    Ran Out of Food in the Last Year: Never true  Transportation Needs: No Transportation Needs (05/10/2023)   PRAPARE - Administrator, Civil Service (Medical): No    Lack of Transportation (Non-Medical): No  Physical Activity: Insufficiently Active (05/10/2023)   Exercise Vital Sign    Days of Exercise per Week: 4 days    Minutes of Exercise per Session: 30 min  Stress: No Stress Concern Present (05/10/2023)   [employer] of Occupational Health - Occupational Stress Questionnaire    Feeling of Stress : Not at all  Social Connections: Moderately Integrated (05/10/2023)   Social Connection and Isolation Panel [NHANES]    Frequency of Communication with Friends and Family: More than three times a week    Frequency of Social Gatherings with Friends and Family: Once a week    Attends Religious Services: More than 4 times per year    Active Member of [employer] or Organizations: No    Attends Engineer, structural: Not on file    Marital Status: Married  Catering manager Violence: Not on file   Family Status  Relation Name Status   Mother  Alive   Father  Deceased at age 53       pneumonia   MGM  Deceased   MGF  Deceased   PGM  Deceased   PGF  Deceased   Other  (Not Specified)   Neg Hx  (Not Specified)  No partnership data on file   Family History  Problem Relation Age of Onset   Cancer Mother    Breast cancer Mother 38   Hypertension Mother    Hypertension Other    Anesthesia problems Neg Hx    Hypotension Neg Hx    Malignant hyperthermia Neg Hx    Pseudochol deficiency Neg Hx    Colon cancer Neg Hx    Colon polyps Neg Hx    Crohn's disease Neg Hx    Esophageal cancer Neg Hx    Rectal cancer Neg Hx    Stomach cancer Neg Hx    Ulcerative colitis Neg Hx    Allergies  Allergen Reactions   Cetirizine & Related Palpitations    Per pt       Review of Systems  Constitutional:  Negative for fever and malaise/fatigue.  HENT:  Positive for congestion, sinus pain and sore throat.   Eyes:  Negative for blurred vision.  Respiratory:  Negative for cough and shortness of breath.   Cardiovascular:  Negative for chest pain, palpitations and leg swelling.  Gastrointestinal:  Negative for vomiting.  Musculoskeletal:  Negative for back pain.  Skin:  Negative for rash.  Neurological:  Negative for loss of consciousness and headaches.      Objective:     BP 100/80 (BP Location: Left Arm, Patient Position: Sitting, Cuff Size: Normal)   Pulse 80   Temp 98.6 F (37 C) (Oral)   Resp 18   Ht 5\' 4"  (1.626 m)   Wt 160 lb 3.2 oz (72.7 kg)   LMP 04/16/2023   SpO2 98%   BMI 27.50 kg/m  BP Readings from Last 3 Encounters:  05/28/23 100/80  05/13/23 110/80  07/11/22 112/74   Wt Readings from Last 3 Encounters:  05/28/23 160 lb 3.2 oz (72.7 kg)  05/13/23 160 lb (72.6 kg)  01/01/23 160 lb (72.6 kg)   SpO2 Readings from Last 3 Encounters:  05/28/23 98%  05/13/23 99%  07/06/22 99%      Physical Exam Vitals and nursing note reviewed.  Constitutional:      General: She is not in acute distress.    Appearance: Normal appearance. She is well-developed.  HENT:     Head: Normocephalic and atraumatic.     Nose: Congestion present.     Mouth/Throat:     Pharynx: Posterior oropharyngeal erythema present.  Eyes:     General: No scleral icterus.       Right eye: No discharge.        Left eye: No discharge.  Cardiovascular:     Rate and Rhythm: Normal rate and regular rhythm.     Heart sounds: No murmur heard. Pulmonary:     Effort: Pulmonary effort is normal. No respiratory distress.     Breath sounds: Normal breath sounds.  Musculoskeletal:        General: Normal range of motion.     Cervical back: Normal range of motion and neck supple.     Right lower leg: No edema.     Left lower leg: No edema.  Skin:    General: Skin  is warm and dry.  Neurological:     General: No focal deficit present.     Mental Status: She is alert and oriented to person, place, and time.  Psychiatric:        Mood and Affect: Mood normal.        Behavior: Behavior normal.        Thought Content: Thought content normal.        Judgment: Judgment normal.      Results for orders placed or performed in visit on 05/28/23  POCT rapid strep A  Result Value Ref Range   Rapid Strep A Screen Positive (A) Negative    Last CBC Lab Results  Component Value Date   WBC 8.5 05/07/2023   HGB 13.7 05/07/2023   HCT 41.3 05/07/2023   MCV 87.1 05/07/2023   MCH 26.3 (L) 04/20/2016   RDW 14.5 05/07/2023   PLT 172.0 05/07/2023   Last metabolic panel Lab Results  Component Value Date   GLUCOSE 95 05/07/2023   NA 137 05/07/2023   K 4.5 05/07/2023   CL 104 05/07/2023   CO2 27 05/07/2023   BUN 11 05/07/2023   CREATININE 0.87 05/07/2023   GFR 79.36 05/07/2023   CALCIUM 9.2 05/07/2023   PROT 7.2 05/07/2023   ALBUMIN 4.2 05/07/2023   BILITOT 0.6 05/07/2023   ALKPHOS 56 05/07/2023   AST 12 05/07/2023   ALT 13 05/07/2023   Last lipids Lab Results  Component Value Date   CHOL 166 05/07/2023   HDL  38.60 (L) 05/07/2023   LDLCALC 98 05/07/2023   TRIG 147.0 05/07/2023   CHOLHDL 4 05/07/2023   Last hemoglobin A1c Lab Results  Component Value Date   HGBA1C 5.3 03/03/2015   Last thyroid functions Lab Results  Component Value Date   TSH 1.80 05/07/2023   T4TOTAL 8.5 09/23/2014   Last vitamin D Lab Results  Component Value Date   VD25OH 27.54 (L) 05/07/2023   Last vitamin B12 and Folate Lab Results  Component Value Date   VITAMINB12 364 05/07/2023      The 10-year ASCVD risk score (Arnett DK, et al., 2019) is: 0.7%    Assessment & Plan:   Problem List Items Addressed This Visit       Unprioritized   Pharyngitis - Primary   Relevant Medications   amoxicillin-clavulanate (AUGMENTIN) 875-125 MG tablet   Other  Relevant Orders   POCT rapid strep A (Completed)   Strep throat   Relevant Medications   fluconazole (DIFLUCAN) 150 MG tablet  Assessment and Plan Assessment & Plan Pharyngitis   She presents with a sore throat since last Wednesday, accompanied by ear congestion and a productive cough, but no fever. The sore throat has improved since yesterday, though it was previously painful on swallowing. Pharyngitis is suspected, with consideration of a streptococcal infection due to recent local cases. She is using Fluticasone nasal spray and warm compresses for symptom relief. Antibiotics may reduce symptoms within 48 hours; without them, symptoms may persist an additional day. Swab the throat for streptococcal infection. Continue Fluticasone nasal spray and warm compresses for throat discomfort.  Eustachian Tube Dysfunction   She reports unilateral ear congestion with a sensation of fullness. There is no infection, but fluid is present. Symptoms are attributed to Eustachian tube dysfunction, likely related to upper respiratory symptoms. The nasal spray is expected to alleviate ear congestion. Continue Fluticasone nasal spray for ear congestion.  Allergy to Antihistamines   She is allergic to antihistamines such as Zyrtec and Xyzal, experiencing increased heart rate. This limits treatment options for allergy-related symptoms. Avoid antihistamines.  Yeast Infections with Amoxicillin   She develops yeast infections with amoxicillin, which is an important consideration when prescribing antibiotics for potential streptococcal infection. Consider alternative antibiotics to avoid yeast infections.    Return if symptoms worsen or fail to improve.    Cordaryl Decelles R Lowne Chase, DO

## 2023-06-04 ENCOUNTER — Ambulatory Visit (HOSPITAL_BASED_OUTPATIENT_CLINIC_OR_DEPARTMENT_OTHER)
Admission: RE | Admit: 2023-06-04 | Discharge: 2023-06-04 | Disposition: A | Discharge status: Home / Self Care | Source: Ambulatory Visit | Attending: Family Medicine | Admitting: Family Medicine

## 2023-06-04 ENCOUNTER — Encounter (HOSPITAL_BASED_OUTPATIENT_CLINIC_OR_DEPARTMENT_OTHER): Payer: Self-pay

## 2023-06-04 DIAGNOSIS — Z1231 Encounter for screening mammogram for malignant neoplasm of breast: Secondary | ICD-10-CM | POA: Insufficient documentation

## 2023-06-21 ENCOUNTER — Other Ambulatory Visit: Payer: Self-pay | Admitting: Family Medicine

## 2023-06-21 MED ORDER — FLUCONAZOLE 150 MG PO TABS: 150.0000 mg | ORAL_TABLET | Freq: Every day | ORAL | 0 refills | Status: DC

## 2023-07-02 ENCOUNTER — Ambulatory Visit (INDEPENDENT_AMBULATORY_CARE_PROVIDER_SITE_OTHER): Payer: BC Managed Care – PPO | Admitting: Otolaryngology

## 2023-07-02 ENCOUNTER — Encounter (INDEPENDENT_AMBULATORY_CARE_PROVIDER_SITE_OTHER): Payer: Self-pay | Admitting: Otolaryngology

## 2023-07-02 VITALS — BP 124/80 | HR 81

## 2023-07-02 DIAGNOSIS — R42 Dizziness and giddiness: Secondary | ICD-10-CM

## 2023-07-02 DIAGNOSIS — H8101 Meniere's disease, right ear: Secondary | ICD-10-CM | POA: Diagnosis not present

## 2023-07-03 NOTE — Progress Notes (Signed)
 Patient ID: Rachel Wiley, female   DOB: 1975-12-30, 49 y.o.   MRN: [id number]  Follow-up: Recurrent dizziness, right ear Mnire's disease, right aural pressure  HPI: The patient is a 48 year old female who returns today for her follow-up evaluation.  The patient has a history of recurrent dizziness and right ear pressure.  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  The patient was placed on a 1500 mg low-salt diet.  The patient returns today reporting 3 episodes of spinning vertigo.  The vertigo was a result of eating high salt diet while she was traveling in [place].  Currently she denies any otalgia, otorrhea, or change in her hearing.  Exam: General: Communicates without difficulty, well nourished, no acute distress. Head: Normocephalic, no evidence injury, no tenderness, facial buttresses intact without stepoff. Eyes: PERRL, EOMI. No scleral icterus, conjunctivae clear. Neuro: CN II exam reveals vision grossly intact.  No nystagmus at any point of gaze. Ears: Auricles well formed without lesions.  Ear canals are intact without mass or lesion.  No erythema or edema is appreciated.  The TMs are intact without fluid. Nose: External evaluation reveals normal support and skin without lesions.  Dorsum is intact.  Anterior rhinoscopy reveals healthy pink mucosa over anterior aspect of inferior turbinates and intact septum.  No purulence noted. Oral:  Oral cavity and oropharynx are intact, symmetric, without erythema or edema.  Mucosa is moist without lesions. Neck: Full range of motion without pain.  There is no significant lymphadenopathy.  No masses palpable.  Thyroid  bed within normal limits to palpation.  Parotid glands and submandibular glands equal bilaterally without mass.  Trachea is midline. Neuro:  CN 2-12  grossly intact. Gait normal. Vestibular: No nystagmus at any point of gaze. Vestibular: There is no nystagmus with pneumatic pressure on either tympanic membrane or Valsalva.  The cerebellar examination is unremarkable.    Assessment: 1.  Recurrent dizziness, secondary to exacerbations of her right ear Mnire's disease. 2.  Her ear canals, tympanic membranes, and middle ear spaces are normal.  Plan: 1.  The physical exam findings are reviewed with the patient. 2.  The pathophysiology of Mnire's disease and dizziness are again discussed with the patient.  Questions are invited and answered. 3.  Continue with a 1500 mg low-salt diet. 4.  Other treatment options for Mnire's disease are also discussed. 5.  The patient will return for reevaluation in 6 months.

## 2023-07-29 ENCOUNTER — Encounter: Payer: Self-pay | Admitting: Family Medicine

## 2023-08-12 ENCOUNTER — Encounter: Payer: Self-pay | Admitting: Family Medicine

## 2023-08-12 ENCOUNTER — Ambulatory Visit: Admitting: Family Medicine

## 2023-08-12 VITALS — BP 133/90 | HR 85 | Temp 97.9°F | Resp 12 | Ht 64.0 in | Wt 158.6 lb

## 2023-08-12 DIAGNOSIS — R591 Generalized enlarged lymph nodes: Secondary | ICD-10-CM

## 2023-08-12 DIAGNOSIS — W57XXXA Bitten or stung by nonvenomous insect and other nonvenomous arthropods, initial encounter: Secondary | ICD-10-CM

## 2023-08-12 DIAGNOSIS — S1096XA Insect bite of unspecified part of neck, initial encounter: Secondary | ICD-10-CM | POA: Diagnosis not present

## 2023-08-12 DIAGNOSIS — R1013 Epigastric pain: Secondary | ICD-10-CM | POA: Diagnosis not present

## 2023-08-12 LAB — CBC WITH DIFFERENTIAL/PLATELET
Basophils Absolute: 0 10*3/uL (ref 0.0–0.1)
Basophils Relative: 0.4 % (ref 0.0–3.0)
Eosinophils Absolute: 0.2 10*3/uL (ref 0.0–0.7)
Eosinophils Relative: 2.7 % (ref 0.0–5.0)
HCT: 40.4 % (ref 36.0–46.0)
Hemoglobin: 13.3 g/dL (ref 12.0–15.0)
Lymphocytes Relative: 28.9 % (ref 12.0–46.0)
Lymphs Abs: 2 10*3/uL (ref 0.7–4.0)
MCHC: 33 g/dL (ref 30.0–36.0)
MCV: 86.5 fl (ref 78.0–100.0)
Monocytes Absolute: 0.4 10*3/uL (ref 0.1–1.0)
Monocytes Relative: 5.6 % (ref 3.0–12.0)
Neutro Abs: 4.4 10*3/uL (ref 1.4–7.7)
Neutrophils Relative %: 62.4 % (ref 43.0–77.0)
Platelets: 191 10*3/uL (ref 150.0–400.0)
RBC: 4.67 Mil/uL (ref 3.87–5.11)
RDW: 15.2 % (ref 11.5–15.5)
WBC: 7 10*3/uL (ref 4.0–10.5)

## 2023-08-12 LAB — COMPREHENSIVE METABOLIC PANEL WITH GFR
ALT: 15 U/L (ref 0–35)
AST: 15 U/L (ref 0–37)
Albumin: 4.4 g/dL (ref 3.5–5.2)
Alkaline Phosphatase: 56 U/L (ref 39–117)
BUN: 10 mg/dL (ref 6–23)
CO2: 28 meq/L (ref 19–32)
Calcium: 9.5 mg/dL (ref 8.4–10.5)
Chloride: 103 meq/L (ref 96–112)
Creatinine, Ser: 0.83 mg/dL (ref 0.40–1.20)
GFR: 83.81 mL/min (ref >60.00)
Glucose, Bld: 97 mg/dL (ref 70–99)
Potassium: 4.4 meq/L (ref 3.5–5.1)
Sodium: 139 meq/L (ref 135–145)
Total Bilirubin: 0.4 mg/dL (ref 0.2–1.2)
Total Protein: 7.4 g/dL (ref 6.0–8.3)

## 2023-08-12 MED ORDER — DOXYCYCLINE HYCLATE 100 MG PO TABS: 100.0000 mg | ORAL_TABLET | Freq: Two times a day (BID) | ORAL | 0 refills | Status: DC

## 2023-08-12 MED ORDER — FAMOTIDINE 20 MG PO TABS: 20.0000 mg | ORAL_TABLET | Freq: Two times a day (BID) | ORAL | 3 refills | Status: AC

## 2023-08-12 NOTE — Progress Notes (Signed)
 Established Patient Office Visit  Subjective   Patient ID: Rachel Wiley, female    DOB: 01-25-1976  Age: 48 y.o. MRN: [id number]  Chief Complaint  Patient presents with   something hard on right side of neck    HPI Discussed the use of AI scribe software for clinical note transcription with the patient, who gave verbal consent to proceed.  History of Present Illness Rachel Wiley is a 48 year old female who presents with a palpable mass in the neck.  Approximately ten days ago, she discovered a large, palpable mass in her neck while showering. She is uncertain about the duration of its presence prior to noticing it. The mass is not associated with any pain.  She denies any recent illness but reports occasional fatigue, which she attributes to a recent dietary change involving sodium intake. She has since resumed her normal salt consumption.  She is experiencing significant stress due to the presence of the mass, particularly because her husband is currently in [place], leaving her to manage the household alone with her younger son. She is concerned about the possibility of the mass being related to a tick bite and potential infections such as Lyme disease or [facility] spotted fever.  Socially, she is living with her younger son while her older son is attending [employer].   Patient Active Problem List   Diagnosis Date Noted   Strep throat 05/28/2023   Dizziness 01/03/2023   RLQ abdominal pain 12/08/2021   Dyspepsia 12/08/2021   PND (post-nasal drip) 04/28/2021   Cervical strain 04/28/2021   Colon cancer screening 04/28/2021   Preventative health care 04/28/2021   Abnormal bleeding in menstrual cycle 04/28/2021   Exposure to the flu 11/30/2020   Pharyngitis 11/30/2020   Otitis of right ear 09/20/2020   Infected tooth 09/20/2020   Bunion, right foot 09/01/2019   Bilateral carpal tunnel syndrome 09/01/2019   Nail abnormalities 09/01/2019   Right knee pain 11/11/2016    Vaginitis and vulvovaginitis 03/03/2015   Plantar fasciitis 05/15/2012   Pre-ulcerative corn or callous 05/15/2012   External hemorrhoid, bleeding 11/28/2011   Anal fissure 11/28/2011   SVD (spontaneous vaginal delivery) 06/18/2011   Tachycardia 03/15/2011   URI (upper respiratory infection) 02/07/2011   Lipoma 04/28/2010   OTHER ACUTE REACTIONS TO STRESS 04/28/2010   Headache 04/28/2010   PALPITATIONS, RECURRENT 04/28/2010   Past Medical History:  Diagnosis Date   Allergy    mild   Anemia    Anxiety    Fibroid    GERD (gastroesophageal reflux disease)    Headache(784.0)    Lipoma of unspecified site    Other acute reactions to stress    Palpitations    SVD (spontaneous vaginal delivery) 06/18/2011   Tachycardia    Past Surgical History:  Procedure Laterality Date   NO PAST SURGERIES     Social History   Tobacco Use   Smoking status: Never    Passive exposure: Past   Smokeless tobacco: Never  Vaping Use   Vaping status: Never Used  Substance Use Topics   Alcohol use: No   Drug use: No   Social History   Socioeconomic History   Marital status: Married    Spouse name: Not on file   Number of children: 2   Years of education: Not on file   Highest education level: Bachelor's degree (e.g., BA, AB, BS)  Occupational History    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  Tobacco Use   Smoking  status: Never    Passive exposure: Past   Smokeless tobacco: Never  Vaping Use   Vaping status: Never Used  Substance and Sexual Activity   Alcohol use: No   Drug use: No   Sexual activity: Yes    Partners: Male    Birth control/protection: Condom  Other Topics Concern   Not on file  Social History Narrative   Never Smoked   Alcohol use-no   Drug use-no   Smoking Status:  never   Drug Use:  No   Exercise--- walking            Social Drivers of Health   Financial Resource Strain: Low Risk  (05/10/2023)   Overall Financial Resource Strain (CARDIA)    Difficulty  of Paying Living Expenses: Not hard at all  Food Insecurity: No Food Insecurity (05/10/2023)   Hunger Vital Sign    Worried About Running Out of Food in the Last Year: Never true    Ran Out of Food in the Last Year: Never true  Transportation Needs: No Transportation Needs (05/10/2023)   PRAPARE - Administrator, Civil Service (Medical): No    Lack of Transportation (Non-Medical): No  Physical Activity: Insufficiently Active (05/10/2023)   Exercise Vital Sign    Days of Exercise per Week: 4 days    Minutes of Exercise per Session: 30 min  Stress: No Stress Concern Present (05/10/2023)   [employer] of Occupational Health - Occupational Stress Questionnaire    Feeling of Stress : Not at all  Social Connections: Moderately Integrated (05/10/2023)   Social Connection and Isolation Panel    Frequency of Communication with Friends and Family: More than three times a week    Frequency of Social Gatherings with Friends and Family: Once a week    Attends Religious Services: More than 4 times per year    Active Member of [employer] or Organizations: No    Attends Engineer, structural: Not on file    Marital Status: Married  Catering manager Violence: Not on file   Family Status  Relation Name Status   Mother  Alive   Father  Deceased at age 4       pneumonia   MGM  Deceased   MGF  Deceased   PGM  Deceased   PGF  Deceased   Other  (Not Specified)   Neg Hx  (Not Specified)  No partnership data on file   Family History  Problem Relation Age of Onset   Cancer Mother    Breast cancer Mother 85   Hypertension Mother    Hypertension Other    Anesthesia problems Neg Hx    Hypotension Neg Hx    Malignant hyperthermia Neg Hx    Pseudochol deficiency Neg Hx    Colon cancer Neg Hx    Colon polyps Neg Hx    Crohn's disease Neg Hx    Esophageal cancer Neg Hx    Rectal cancer Neg Hx    Stomach cancer Neg Hx    Ulcerative colitis Neg Hx    Allergies  Allergen  Reactions   Cetirizine  & Related Palpitations    Per pt      Review of Systems  Constitutional:  Negative for fever and malaise/fatigue.  HENT:  Negative for congestion.   Eyes:  Negative for blurred vision.  Respiratory:  Negative for cough and shortness of breath.   Cardiovascular:  Negative for chest pain, palpitations and leg swelling.  Gastrointestinal:  Negative for vomiting.  Musculoskeletal:  Negative for back pain.  Skin:  Negative for rash.  Neurological:  Negative for loss of consciousness and headaches.      Objective:     BP (!) 133/90 (BP Location: Left Arm, Patient Position: Sitting, Cuff Size: Normal)   Pulse 85   Temp 97.9 F (36.6 C) (Oral)   Resp 12   Ht 5' 4 (1.626 m)   Wt 158 lb 9.6 oz (71.9 kg)   LMP 08/03/2023   SpO2 100%   BMI 27.22 kg/m  BP Readings from Last 3 Encounters:  08/12/23 (!) 133/90  07/02/23 124/80  05/28/23 100/80   Wt Readings from Last 3 Encounters:  08/12/23 158 lb 9.6 oz (71.9 kg)  05/28/23 160 lb 3.2 oz (72.7 kg)  05/13/23 160 lb (72.6 kg)   SpO2 Readings from Last 3 Encounters:  08/12/23 100%  07/02/23 97%  05/28/23 98%      Physical Exam Vitals and nursing note reviewed.  Constitutional:      General: She is not in acute distress.    Appearance: Normal appearance. She is well-developed.  HENT:     Head: Normocephalic and atraumatic.   Eyes:     General: No scleral icterus.       Right eye: No discharge.        Left eye: No discharge.   Neck:    Cardiovascular:     Rate and Rhythm: Normal rate and regular rhythm.     Heart sounds: No murmur heard. Pulmonary:     Effort: Pulmonary effort is normal. No respiratory distress.     Breath sounds: Normal breath sounds.   Musculoskeletal:        General: Normal range of motion.     Cervical back: Normal range of motion and neck supple.     Right lower leg: No edema.     Left lower leg: No edema.  Lymphadenopathy:     Cervical: Cervical adenopathy  present.     Right cervical: Posterior cervical adenopathy present.     Left cervical: No superficial, deep or posterior cervical adenopathy.   Skin:    General: Skin is warm and dry.   Neurological:     Mental Status: She is alert and oriented to person, place, and time.   Psychiatric:        Mood and Affect: Mood normal.        Behavior: Behavior normal.        Thought Content: Thought content normal.        Judgment: Judgment normal.      No results found for any visits on 08/12/23.  Last CBC Lab Results  Component Value Date   WBC 8.5 05/07/2023   HGB 13.7 05/07/2023   HCT 41.3 05/07/2023   MCV 87.1 05/07/2023   MCH 26.3 (L) 04/20/2016   RDW 14.5 05/07/2023   PLT 172.0 05/07/2023   Last metabolic panel Lab Results  Component Value Date   GLUCOSE 95 05/07/2023   NA 137 05/07/2023   K 4.5 05/07/2023   CL 104 05/07/2023   CO2 27 05/07/2023   BUN 11 05/07/2023   CREATININE 0.87 05/07/2023   GFR 79.36 05/07/2023   CALCIUM 9.2 05/07/2023   PROT 7.2 05/07/2023   ALBUMIN 4.2 05/07/2023   BILITOT 0.6 05/07/2023   ALKPHOS 56 05/07/2023   AST 12 05/07/2023   ALT 13 05/07/2023   Last lipids Lab Results  Component Value Date   CHOL  166 05/07/2023   HDL 38.60 (L) 05/07/2023   LDLCALC 98 05/07/2023   TRIG 147.0 05/07/2023   CHOLHDL 4 05/07/2023   Last hemoglobin A1c Lab Results  Component Value Date   HGBA1C 5.3 03/03/2015   Last thyroid  functions Lab Results  Component Value Date   TSH 1.80 05/07/2023   T4TOTAL 8.5 09/23/2014   Last vitamin D  Lab Results  Component Value Date   VD25OH 27.54 (L) 05/07/2023   Last vitamin B12 and Folate Lab Results  Component Value Date   VITAMINB12 364 05/07/2023      The 10-year ASCVD risk score (Arnett DK, et al., 2019) is: 1.3%    Assessment & Plan:   Problem List Items Addressed This Visit   None Visit Diagnoses       Tick bite of neck, initial encounter    -  Primary   Relevant Medications    doxycycline (VIBRA-TABS) 100 MG tablet   Other Relevant Orders   Lyme Disease Serology w/Reflex   [facility] spotted fvr abs pnl(IgG+IgM)   CBC with Differential/Platelet   Comprehensive metabolic panel with GFR     Lymphadenopathy       Relevant Orders   CBC with Differential/Platelet   CT SOFT TISSUE NECK W CONTRAST   Comprehensive metabolic panel with GFR     Epigastric pain       Relevant Medications   famotidine  (PEPCID ) 20 MG tablet     Assessment and Plan Assessment & Plan Enlarged lymph node   She presents with an enlarged, non-painful lymph node on the neck, noticed approximately ten days ago. No recent illness reported, but occasional fatigue is present. Differential diagnosis includes a reactive lymph node possibly due to a tick bite, given the history of itching in the area. Plan to rule out infections such as Lyme disease and [facility] spotted fever. Informed consent includes understanding that if the lymph node is reactive, it should reduce with antibiotics. If not, further imaging like an ultrasound or CT scan will be considered, pending insurance approval. Order CBC and blood tests for Lyme disease and [facility] spotted fever. Prescribe antibiotics for 10 days. Advise weekly monitoring of lymph node size. Order CT scan of the neck, pending insurance approval. Instruct to contact the office if the lymph node does not improve.  Stress   She is experiencing stress due to personal circumstances, including her husband's absence and managing responsibilities alone. Stress is exacerbated by concern over the enlarged lymph node. Provide reassurance and advise stress management.  Request for famotidine    She requested and received a prescription for famotidine . Prescribe famotidine .    No follow-ups on file.    Kaetlin Bullen R Lowne Chase, DO

## 2023-08-13 LAB — LYME DISEASE SEROLOGY W/REFLEX: Lyme Total Antibody EIA: NEGATIVE

## 2023-08-16 LAB — ROCKY MTN SPOTTED FVR ABS PNL(IGG+IGM)
RMSF IgG: NOT DETECTED
RMSF IgM: NOT DETECTED

## 2023-08-19 ENCOUNTER — Ambulatory Visit: Payer: Self-pay | Admitting: Family Medicine

## 2023-08-21 ENCOUNTER — Encounter (HOSPITAL_BASED_OUTPATIENT_CLINIC_OR_DEPARTMENT_OTHER): Payer: Self-pay

## 2023-08-21 ENCOUNTER — Ambulatory Visit (HOSPITAL_BASED_OUTPATIENT_CLINIC_OR_DEPARTMENT_OTHER)
Admission: RE | Admit: 2023-08-21 | Discharge: 2023-08-21 | Disposition: A | Discharge status: Home / Self Care | Source: Ambulatory Visit | Attending: Family Medicine | Admitting: Family Medicine

## 2023-08-21 DIAGNOSIS — R591 Generalized enlarged lymph nodes: Secondary | ICD-10-CM | POA: Diagnosis not present

## 2023-08-21 MED ORDER — IOHEXOL 300 MG/ML  SOLN
100.0000 mL | Freq: Once | INTRAMUSCULAR | Status: AC | PRN
  Administered 2023-08-21: 75 mL via INTRAVENOUS

## 2023-09-09 ENCOUNTER — Ambulatory Visit: Admitting: Family Medicine

## 2023-09-09 VITALS — BP 111/82 | HR 78 | Temp 98.4°F | Resp 16 | Ht 64.0 in | Wt 161.0 lb

## 2023-09-09 DIAGNOSIS — J0140 Acute pansinusitis, unspecified: Secondary | ICD-10-CM

## 2023-09-09 MED ORDER — AMOXICILLIN-POT CLAVULANATE 875-125 MG PO TABS: 1.0000 | ORAL_TABLET | Freq: Two times a day (BID) | ORAL | 0 refills | Status: DC

## 2023-09-09 NOTE — Progress Notes (Signed)
 +  Subjective:    Patient ID: Rachel Wiley, female    DOB: 1975-03-11, 48 y.o.   MRN: [id number]  Chief Complaint  Patient presents with   Sore Throat    Patient complains of sore throat for 8 days   Cough    Patient complains of cough for 2 days     HPI Patient is in today for c/o sore throat and cough x >8 days. Discussed the use of AI scribe software for clinical note transcription with the patient, who gave verbal consent to proceed.  History of Present Illness Rachel Wiley is a 48 year old female who presents with upper respiratory symptoms and cough.  Her symptoms began after her children fell ill last week. Her oldest son returned from a trip with a fever, and her younger son had a sore throat. She initially experienced a sore throat, which improved after two to three days.  She then felt a sensation of a bug in her ear while sleeping, followed by nasal congestion and significant sinus pressure on one side. No headache or chest pain.  Two days ago, she developed a cough and throat itching. She is coughing up green sputum but has no fever. The cough is not disturbing her sleep.  She has been using cherry-flavored cough drops and has Flonase  at home, which she used for three days but then forgot to continue. She has not taken any over-the-counter medications other than the cough drops. She has been using Vicks and taking steam before bed to alleviate symptoms. She has not started any new medications recently.  No headache, chest pain, or fever. Reports sinus pressure and coughing up green sputum. The cough is not keeping her awake at night.    Past Medical History:  Diagnosis Date   Allergy    mild   Anemia    Anxiety    Fibroid    GERD (gastroesophageal reflux disease)    Headache(784.0)    Lipoma of unspecified site    Other acute reactions to stress    Palpitations    SVD (spontaneous vaginal delivery) 06/18/2011   Tachycardia     Past Surgical History:  Procedure  Laterality Date   NO PAST SURGERIES      Family History  Problem Relation Age of Onset   Cancer Mother    Breast cancer Mother 57   Hypertension Mother    Hypertension Other    Anesthesia problems Neg Hx    Hypotension Neg Hx    Malignant hyperthermia Neg Hx    Pseudochol deficiency Neg Hx    Colon cancer Neg Hx    Colon polyps Neg Hx    Crohn's disease Neg Hx    Esophageal cancer Neg Hx    Rectal cancer Neg Hx    Stomach cancer Neg Hx    Ulcerative colitis Neg Hx     Social History   Socioeconomic History   Marital status: Married    Spouse name: Not on file   Number of children: 2   Years of education: Not on file   Highest education level: Bachelor's degree (e.g., BA, AB, BS)  Occupational History    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  Tobacco Use   Smoking status: Never    Passive exposure: Past   Smokeless tobacco: Never  Vaping Use   Vaping status: Never Used  Substance and Sexual Activity   Alcohol use: No   Drug use: No   Sexual activity: Yes  Partners: Male    Birth control/protection: Condom  Other Topics Concern   Not on file  Social History Narrative   Never Smoked   Alcohol use-no   Drug use-no   Smoking Status:  never   Drug Use:  No   Exercise--- walking            Social Drivers of Health   Financial Resource Strain: Low Risk  (09/09/2023)   Overall Financial Resource Strain (CARDIA)    Difficulty of Paying Living Expenses: Not hard at all  Food Insecurity: No Food Insecurity (09/09/2023)   Hunger Vital Sign    Worried About Running Out of Food in the Last Year: Never true    Ran Out of Food in the Last Year: Never true  Transportation Needs: No Transportation Needs (09/09/2023)   PRAPARE - Administrator, Civil Service (Medical): No    Lack of Transportation (Non-Medical): No  Physical Activity: Insufficiently Active (09/09/2023)   Exercise Vital Sign    Days of Exercise per Week: 4 days    Minutes of Exercise per  Session: 20 min  Stress: No Stress Concern Present (09/09/2023)   [employer] of Occupational Health - Occupational Stress Questionnaire    Feeling of Stress: Not at all  Social Connections: Moderately Integrated (09/09/2023)   Social Connection and Isolation Panel    Frequency of Communication with Friends and Family: More than three times a week    Frequency of Social Gatherings with Friends and Family: Twice a week    Attends Religious Services: More than 4 times per year    Active Member of [employer] or Organizations: No    Attends Engineer, structural: Not on file    Marital Status: Married  Catering manager Violence: Not on file    Outpatient Medications Prior to Visit  Medication Sig Dispense Refill   Azelastine  HCl 137 MCG/SPRAY SOLN Place 1 spray into both nostrils 2 (two) times daily as needed. 90 mL 2   famotidine  (PEPCID ) 20 MG tablet Take 1 tablet (20 mg total) by mouth 2 (two) times daily. 180 tablet 3   fluticasone  (FLONASE ) 50 MCG/ACT nasal spray PLACE 2 SPRAYS INTO BOTH NOSTRILS AS NEEDED FOR ALLERGIES OR RHINITIS. 16 mL 5   Multiple Vitamin (MULTI VITAMIN DAILY PO) Take by mouth.     Na Sulfate-K Sulfate-Mg Sulf 17.5-3.13-1.6 GM/177ML SOLN As directed by GI office 354 mL 0   propranolol  (INDERAL ) 20 MG tablet 1 po as needed palpatations     doxycycline  (VIBRA -TABS) 100 MG tablet Take 1 tablet (100 mg total) by mouth 2 (two) times daily. 20 tablet 0   Vitamin D , Ergocalciferol , (DRISDOL ) 1.25 MG (50000 UNIT) CAPS capsule Take 1 capsule (50,000 Units total) by mouth every 7 (seven) days. 12 capsule 0   No facility-administered medications prior to visit.    Allergies  Allergen Reactions   Cetirizine  & Related Palpitations    Per pt    Review of Systems  Constitutional:  Negative for chills, fever and malaise/fatigue.  HENT:  Positive for congestion, sinus pain and sore throat. Negative for hearing loss.   Eyes:  Negative for blurred vision and discharge.   Respiratory:  Positive for cough and sputum production. Negative for shortness of breath and wheezing.   Cardiovascular:  Negative for chest pain, palpitations and leg swelling.  Gastrointestinal:  Negative for abdominal pain, blood in stool, constipation, diarrhea, heartburn, nausea and vomiting.  Genitourinary:  Negative for dysuria, frequency, hematuria  and urgency.  Musculoskeletal:  Negative for back pain, falls and myalgias.  Skin:  Negative for rash.  Neurological:  Negative for dizziness, sensory change, loss of consciousness, weakness and headaches.  Endo/Heme/Allergies:  Negative for environmental allergies. Does not bruise/bleed easily.  Psychiatric/Behavioral:  Negative for depression and suicidal ideas. The patient is not nervous/anxious and does not have insomnia.        Objective:    Physical Exam Vitals and nursing note reviewed.  Constitutional:      General: She is not in acute distress.    Appearance: Normal appearance. She is well-developed.  HENT:     Head: Normocephalic and atraumatic.     Right Ear: Tenderness present. No middle ear effusion. Tympanic membrane is not erythematous.     Left Ear: Tympanic membrane and ear canal normal.     Mouth/Throat:     Pharynx: Posterior oropharyngeal erythema and postnasal drip present.  Eyes:     General: No scleral icterus.       Right eye: No discharge.        Left eye: No discharge.  Cardiovascular:     Rate and Rhythm: Normal rate and regular rhythm.     Heart sounds: No murmur heard. Pulmonary:     Effort: Pulmonary effort is normal. No respiratory distress.     Breath sounds: Normal breath sounds. No stridor. No wheezing, rhonchi or rales.  Musculoskeletal:        General: Normal range of motion.     Cervical back: Normal range of motion and neck supple.     Right lower leg: No edema.     Left lower leg: No edema.  Skin:    General: Skin is warm and dry.  Neurological:     Mental Status: She is alert and  oriented to person, place, and time.  Psychiatric:        Mood and Affect: Mood normal.        Behavior: Behavior normal.        Thought Content: Thought content normal.        Judgment: Judgment normal.     BP 111/82 (BP Location: Left Arm, Patient Position: Sitting)   Pulse 78   Temp 98.4 F (36.9 C) (Oral)   Resp 16   Ht 5' 4 (1.626 m)   Wt 161 lb (73 kg)   LMP 08/01/2023   SpO2 100%   BMI 27.64 kg/m  Wt Readings from Last 3 Encounters:  09/09/23 161 lb (73 kg)  08/12/23 158 lb 9.6 oz (71.9 kg)  05/28/23 160 lb 3.2 oz (72.7 kg)    Diabetic Foot Exam - Simple   No data filed    Lab Results  Component Value Date   WBC 7.0 08/12/2023   HGB 13.3 08/12/2023   HCT 40.4 08/12/2023   PLT 191.0 08/12/2023   GLUCOSE 97 08/12/2023   CHOL 166 05/07/2023   TRIG 147.0 05/07/2023   HDL 38.60 (L) 05/07/2023   LDLCALC 98 05/07/2023   ALT 15 08/12/2023   AST 15 08/12/2023   NA 139 08/12/2023   K 4.4 08/12/2023   CL 103 08/12/2023   CREATININE 0.83 08/12/2023   BUN 10 08/12/2023   CO2 28 08/12/2023   TSH 1.80 05/07/2023   HGBA1C 5.3 03/03/2015    Lab Results  Component Value Date   TSH 1.80 05/07/2023   Lab Results  Component Value Date   WBC 7.0 08/12/2023   HGB 13.3 08/12/2023  HCT 40.4 08/12/2023   MCV 86.5 08/12/2023   PLT 191.0 08/12/2023   Lab Results  Component Value Date   NA 139 08/12/2023   K 4.4 08/12/2023   CO2 28 08/12/2023   GLUCOSE 97 08/12/2023   BUN 10 08/12/2023   CREATININE 0.83 08/12/2023   BILITOT 0.4 08/12/2023   ALKPHOS 56 08/12/2023   AST 15 08/12/2023   ALT 15 08/12/2023   PROT 7.4 08/12/2023   ALBUMIN 4.4 08/12/2023   CALCIUM 9.5 08/12/2023   GFR 83.81 08/12/2023   Lab Results  Component Value Date   CHOL 166 05/07/2023   Lab Results  Component Value Date   HDL 38.60 (L) 05/07/2023   Lab Results  Component Value Date   LDLCALC 98 05/07/2023   Lab Results  Component Value Date   TRIG 147.0 05/07/2023   Lab  Results  Component Value Date   CHOLHDL 4 05/07/2023   Lab Results  Component Value Date   HGBA1C 5.3 03/03/2015       Assessment & Plan:  Acute non-recurrent pansinusitis -     Amoxicillin -Pot Clavulanate; Take 1 tablet by mouth 2 (two) times daily.  Dispense: 20 tablet; Refill: 0  Assessment and Plan Assessment & Plan Upper Respiratory Tract Infection   Symptoms align with an upper respiratory tract infection, likely viral, following her children's illness. Initial sore throat has improved, but she now experiences a productive cough with green sputum and unilateral sinus pressure. No fever or significant chest involvement. Ear canal irritation is present, but the eardrum remains intact. Inconsistent use of Flonase  and cherry-flavored cough drops noted. Start Augmentin  for 10 days, with the option to discontinue after 7 days if symptoms improve. Recommend Mucinex or Mucinex DM to thin mucus and facilitate expectoration. Advise using Flonase  or saline spray for sinus pressure relief, gargling with salt water, and continuing cough drops. Instruct to call the office if additional symptoms develop or if there is no improvement.    Katalena Malveaux R Lowne Chase, DO

## 2023-10-15 ENCOUNTER — Telehealth: Payer: Self-pay

## 2023-10-15 ENCOUNTER — Ambulatory Visit (HOSPITAL_BASED_OUTPATIENT_CLINIC_OR_DEPARTMENT_OTHER)
Admission: RE | Admit: 2023-10-15 | Discharge: 2023-10-15 | Disposition: A | Discharge status: Home / Self Care | Source: Ambulatory Visit | Attending: Family Medicine | Admitting: Family Medicine

## 2023-10-15 ENCOUNTER — Encounter: Payer: Self-pay | Admitting: Family Medicine

## 2023-10-15 ENCOUNTER — Ambulatory Visit: Admitting: Family Medicine

## 2023-10-15 ENCOUNTER — Ambulatory Visit: Payer: Self-pay | Admitting: Family Medicine

## 2023-10-15 VITALS — BP 130/88 | HR 84 | Temp 98.0°F | Resp 18 | Ht 64.0 in | Wt 161.0 lb

## 2023-10-15 DIAGNOSIS — R7989 Other specified abnormal findings of blood chemistry: Secondary | ICD-10-CM | POA: Insufficient documentation

## 2023-10-15 DIAGNOSIS — R5383 Other fatigue: Secondary | ICD-10-CM | POA: Insufficient documentation

## 2023-10-15 DIAGNOSIS — E559 Vitamin D deficiency, unspecified: Secondary | ICD-10-CM | POA: Diagnosis not present

## 2023-10-15 DIAGNOSIS — R0789 Other chest pain: Secondary | ICD-10-CM | POA: Insufficient documentation

## 2023-10-15 DIAGNOSIS — R079 Chest pain, unspecified: Secondary | ICD-10-CM | POA: Insufficient documentation

## 2023-10-15 LAB — TROPONIN I (HIGH SENSITIVITY): High Sens Troponin I: 3 ng/L (ref 2–17)

## 2023-10-15 LAB — COMPREHENSIVE METABOLIC PANEL WITH GFR
ALT: 13 U/L (ref 0–35)
AST: 12 U/L (ref 0–37)
Albumin: 4.3 g/dL (ref 3.5–5.2)
Alkaline Phosphatase: 53 U/L (ref 39–117)
BUN: 11 mg/dL (ref 6–23)
CO2: 25 meq/L (ref 19–32)
Calcium: 8.8 mg/dL (ref 8.4–10.5)
Chloride: 102 meq/L (ref 96–112)
Creatinine, Ser: 0.79 mg/dL (ref 0.40–1.20)
GFR: 88.82 mL/min (ref >60.00)
Glucose, Bld: 93 mg/dL (ref 70–99)
Potassium: 4.1 meq/L (ref 3.5–5.1)
Sodium: 137 meq/L (ref 135–145)
Total Bilirubin: 0.5 mg/dL (ref 0.2–1.2)
Total Protein: 7.6 g/dL (ref 6.0–8.3)

## 2023-10-15 LAB — CBC WITH DIFFERENTIAL/PLATELET
Basophils Absolute: 0 K/uL (ref 0.0–0.1)
Basophils Relative: 0.3 % (ref 0.0–3.0)
Eosinophils Absolute: 0.2 K/uL (ref 0.0–0.7)
Eosinophils Relative: 2 % (ref 0.0–5.0)
HCT: 39.1 % (ref 36.0–46.0)
Hemoglobin: 12.8 g/dL (ref 12.0–15.0)
Lymphocytes Relative: 31.2 % (ref 12.0–46.0)
Lymphs Abs: 2.5 K/uL (ref 0.7–4.0)
MCHC: 32.8 g/dL (ref 30.0–36.0)
MCV: 84.9 fl (ref 78.0–100.0)
Monocytes Absolute: 0.5 K/uL (ref 0.1–1.0)
Monocytes Relative: 6.3 % (ref 3.0–12.0)
Neutro Abs: 4.9 K/uL (ref 1.4–7.7)
Neutrophils Relative %: 60.2 % (ref 43.0–77.0)
Platelets: 184 K/uL (ref 150.0–400.0)
RBC: 4.6 Mil/uL (ref 3.87–5.11)
RDW: 14.6 % (ref 11.5–15.5)
WBC: 8.1 K/uL (ref 4.0–10.5)

## 2023-10-15 LAB — D-DIMER, QUANTITATIVE: D-Dimer, Quant: 0.7 ug{FEU}/mL — ABNORMAL HIGH (ref <0.50)

## 2023-10-15 LAB — TSH: TSH: 1.47 u[IU]/mL (ref 0.35–5.50)

## 2023-10-15 LAB — VITAMIN B12: Vitamin B-12: 373 pg/mL (ref 211–911)

## 2023-10-15 LAB — VITAMIN D 25 HYDROXY (VIT D DEFICIENCY, FRACTURES): VITD: 34.37 ng/mL (ref 30.00–100.00)

## 2023-10-15 MED ORDER — IOHEXOL 350 MG/ML SOLN
75.0000 mL | Freq: Once | INTRAVENOUS | Status: AC | PRN
  Administered 2023-10-15: 75 mL via INTRAVENOUS

## 2023-10-15 NOTE — Assessment & Plan Note (Signed)
 EKG nothing acute Check labs  If Chest pain returns-- go to ER

## 2023-10-15 NOTE — Telephone Encounter (Signed)
 Spoke with patient. Pt advised on labs and discussed imaging. Pt verbalized understanding.

## 2023-10-15 NOTE — Telephone Encounter (Signed)
 Copied from CRM #[id number]. Topic: Clinical - Lab/Test Results >> Oct 15, 2023  4:19 PM Dedra B wrote: Reason for CRM: Pt calling to discuss one of her lab results that came back elevated. Pls call pt.

## 2023-10-15 NOTE — Progress Notes (Signed)
 [facility] Chest report called to Dr. Antonio Meth @ 20:07 who verbally acknowledged the results.

## 2023-10-15 NOTE — Progress Notes (Signed)
 Subjective:    Patient ID: Rachel Wiley, female    DOB: 06-28-1975, 48 y.o.   MRN: [id number]  Chief Complaint  Patient presents with   Fatigue    X2 days, pt states feeling fatigued and restlessness. Pt states she almost went to the ED. Pt states taking liquid IV and stated feeling better. Pt reports some swelling in ankles    HPI Patient is in today for fatigue.  Discussed the use of AI scribe software for clinical note transcription with the patient, who gave verbal consent to proceed.  History of Present Illness Rachel Wiley is a 48 year old female who presents with chest pressure and fatigue.  She experienced right-sided chest pressure intermittently for two to three days last week, which she usually associates with gas. Her blood pressure was elevated to 147/104 mmHg one night but normalized after eating. The chest pressure has since resolved. No burping or heartburn was noted.  On Sunday, she felt tired and restless, initially attributing it to not eating due to a festival. Despite eating, the symptoms persisted, and she considered going to the emergency room. After taking a liquid IV and resting, she felt better and was able to go for a walk in the evening. The following day, she still felt tired but improved after another liquid IV. Today, she feels much better and less tired.  She noticed a 'fluid pocket' on both sides of her leg last night, but it is not significantly large. She is unsure if it is a recent development or has been present for some time.  She has experienced palpitations but has not taken the medication previously prescribed for it. Her past medical history includes Meniere's disease, for which she avoids low sodium salt and uses regular salt in reduced quantities.  t  Past Medical History:  Diagnosis Date   Allergy    mild   Anemia    Anxiety    Fibroid    GERD (gastroesophageal reflux disease)    Headache(784.0)    Lipoma of unspecified site    Other  acute reactions to stress    Palpitations    SVD (spontaneous vaginal delivery) 06/18/2011   Tachycardia     Past Surgical History:  Procedure Laterality Date   NO PAST SURGERIES      Family History  Problem Relation Age of Onset   Cancer Mother    Breast cancer Mother 39   Hypertension Mother    Hypertension Other    Anesthesia problems Neg Hx    Hypotension Neg Hx    Malignant hyperthermia Neg Hx    Pseudochol deficiency Neg Hx    Colon cancer Neg Hx    Colon polyps Neg Hx    Crohn's disease Neg Hx    Esophageal cancer Neg Hx    Rectal cancer Neg Hx    Stomach cancer Neg Hx    Ulcerative colitis Neg Hx     Social History   Socioeconomic History   Marital status: Married    Spouse name: Not on file   Number of children: 2   Years of education: Not on file   Highest education level: Bachelor's degree (e.g., BA, AB, BS)  Occupational History    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  Tobacco Use   Smoking status: Never    Passive exposure: Past   Smokeless tobacco: Never  Vaping Use   Vaping status: Never Used  Substance and Sexual Activity   Alcohol use: No  Drug use: No   Sexual activity: Yes    Partners: Male    Birth control/protection: Condom  Other Topics Concern   Not on file  Social History Narrative   Never Smoked   Alcohol use-no   Drug use-no   Smoking Status:  never   Drug Use:  No   Exercise--- walking            Social Drivers of Health   Financial Resource Strain: Low Risk  (09/09/2023)   Overall Financial Resource Strain (CARDIA)    Difficulty of Paying Living Expenses: Not hard at all  Food Insecurity: No Food Insecurity (09/09/2023)   Hunger Vital Sign    Worried About Running Out of Food in the Last Year: Never true    Ran Out of Food in the Last Year: Never true  Transportation Needs: No Transportation Needs (09/09/2023)   PRAPARE - Administrator, Civil Service (Medical): No    Lack of Transportation (Non-Medical):  No  Physical Activity: Insufficiently Active (09/09/2023)   Exercise Vital Sign    Days of Exercise per Week: 4 days    Minutes of Exercise per Session: 20 min  Stress: No Stress Concern Present (09/09/2023)   [employer] of Occupational Health - Occupational Stress Questionnaire    Feeling of Stress: Not at all  Social Connections: Moderately Integrated (09/09/2023)   Social Connection and Isolation Panel    Frequency of Communication with Friends and Family: More than three times a week    Frequency of Social Gatherings with Friends and Family: Twice a week    Attends Religious Services: More than 4 times per year    Active Member of [employer] or Organizations: No    Attends Engineer, structural: Not on file    Marital Status: Married  Catering manager Violence: Not on file    Outpatient Medications Prior to Visit  Medication Sig Dispense Refill   amoxicillin -clavulanate (AUGMENTIN ) 875-125 MG tablet Take 1 tablet by mouth 2 (two) times daily. 20 tablet 0   Azelastine  HCl 137 MCG/SPRAY SOLN Place 1 spray into both nostrils 2 (two) times daily as needed. 90 mL 2   famotidine  (PEPCID ) 20 MG tablet Take 1 tablet (20 mg total) by mouth 2 (two) times daily. 180 tablet 3   fluticasone  (FLONASE ) 50 MCG/ACT nasal spray PLACE 2 SPRAYS INTO BOTH NOSTRILS AS NEEDED FOR ALLERGIES OR RHINITIS. 16 mL 5   Multiple Vitamin (MULTI VITAMIN DAILY PO) Take by mouth.     Na Sulfate-K Sulfate-Mg Sulf 17.5-3.13-1.6 GM/177ML SOLN As directed by GI office 354 mL 0   propranolol  (INDERAL ) 20 MG tablet 1 po as needed palpatations     No facility-administered medications prior to visit.    Allergies  Allergen Reactions   Cetirizine  & Related Palpitations    Per pt    Review of Systems  Constitutional:  Positive for malaise/fatigue. Negative for fever.  HENT:  Negative for congestion.   Eyes:  Negative for blurred vision.  Respiratory:  Negative for shortness of breath.   Cardiovascular:   Positive for chest pain. Negative for palpitations and leg swelling.  Gastrointestinal:  Negative for abdominal pain, blood in stool and nausea.  Genitourinary:  Negative for dysuria and frequency.  Musculoskeletal:  Negative for falls.  Skin:  Negative for rash.  Neurological:  Negative for dizziness, loss of consciousness and headaches.  Endo/Heme/Allergies:  Negative for environmental allergies.  Psychiatric/Behavioral:  Negative for depression. The patient is not  nervous/anxious.        Objective:    Physical Exam Vitals and nursing note reviewed.  Constitutional:      General: She is not in acute distress.    Appearance: Normal appearance. She is well-developed.  HENT:     Head: Normocephalic and atraumatic.  Eyes:     General: No scleral icterus.       Right eye: No discharge.        Left eye: No discharge.  Cardiovascular:     Rate and Rhythm: Normal rate and regular rhythm.     Heart sounds: No murmur heard. Pulmonary:     Effort: Pulmonary effort is normal. No respiratory distress.     Breath sounds: Normal breath sounds.  Musculoskeletal:        General: Normal range of motion.     Cervical back: Normal range of motion and neck supple.     Right lower leg: No edema.     Left lower leg: No edema.  Skin:    General: Skin is warm and dry.  Neurological:     Mental Status: She is alert and oriented to person, place, and time.  Psychiatric:        Mood and Affect: Mood normal.        Behavior: Behavior normal.        Thought Content: Thought content normal.        Judgment: Judgment normal.   EKG-- sinus ,   BP 130/88 (BP Location: Left Arm, Patient Position: Sitting, Cuff Size: Normal)   Pulse 84   Temp 98 F (36.7 C) (Oral)   Resp 18   Ht 5' 4 (1.626 m)   Wt 161 lb (73 kg)   SpO2 99%   BMI 27.64 kg/m  Wt Readings from Last 3 Encounters:  10/15/23 161 lb (73 kg)  09/09/23 161 lb (73 kg)  08/12/23 158 lb 9.6 oz (71.9 kg)    Diabetic Foot Exam -  Simple   No data filed    Lab Results  Component Value Date   WBC 7.0 08/12/2023   HGB 13.3 08/12/2023   HCT 40.4 08/12/2023   PLT 191.0 08/12/2023   GLUCOSE 97 08/12/2023   CHOL 166 05/07/2023   TRIG 147.0 05/07/2023   HDL 38.60 (L) 05/07/2023   LDLCALC 98 05/07/2023   ALT 15 08/12/2023   AST 15 08/12/2023   NA 139 08/12/2023   K 4.4 08/12/2023   CL 103 08/12/2023   CREATININE 0.83 08/12/2023   BUN 10 08/12/2023   CO2 28 08/12/2023   TSH 1.80 05/07/2023   HGBA1C 5.3 03/03/2015    Lab Results  Component Value Date   TSH 1.80 05/07/2023   Lab Results  Component Value Date   WBC 7.0 08/12/2023   HGB 13.3 08/12/2023   HCT 40.4 08/12/2023   MCV 86.5 08/12/2023   PLT 191.0 08/12/2023   Lab Results  Component Value Date   NA 139 08/12/2023   K 4.4 08/12/2023   CO2 28 08/12/2023   GLUCOSE 97 08/12/2023   BUN 10 08/12/2023   CREATININE 0.83 08/12/2023   BILITOT 0.4 08/12/2023   ALKPHOS 56 08/12/2023   AST 15 08/12/2023   ALT 15 08/12/2023   PROT 7.4 08/12/2023   ALBUMIN 4.4 08/12/2023   CALCIUM 9.5 08/12/2023   GFR 83.81 08/12/2023   Lab Results  Component Value Date   CHOL 166 05/07/2023   Lab Results  Component Value Date  HDL 38.60 (L) 05/07/2023   Lab Results  Component Value Date   LDLCALC 98 05/07/2023   Lab Results  Component Value Date   TRIG 147.0 05/07/2023   Lab Results  Component Value Date   CHOLHDL 4 05/07/2023   Lab Results  Component Value Date   HGBA1C 5.3 03/03/2015       Assessment & Plan:  Fatigue, unspecified type Assessment & Plan: Check labs  Pt was better with liquid IV   Orders: -     CBC with Differential/Platelet -     Comprehensive metabolic panel with GFR -     TSH -     Vitamin B12  Vitamin D  deficiency -     VITAMIN D  25 Hydroxy (Vit-D Deficiency, Fractures)  Chest pain, unspecified type Assessment & Plan: EKG nothing acute Check labs  If Chest pain returns-- go to ER   Orders: -      Troponin I (High Sensitivity) -     D-dimer, quantitative -     EKG 12-Lead  Assessment and Plan Assessment & Plan Fatigue and chest pain evaluation   She experienced intermittent chest pain and fatigue over the past week, with the chest pain described as pressure on the right side, similar to gas pain. Her blood pressure was elevated at 147/104 mmHg but normalized after rest and eating. Symptoms improved with rest and hydration. The differential includes dehydration and fatigue from recent activities. She currently has no chest pain or restlessness. Order EKG and blood work to evaluate underlying causes of fatigue and chest pain.  Palpitations   She reports palpitations and has medication prescribed previously but has not taken it. Palpitations may be related to fatigue and dehydration. Order EKG to assess cardiac rhythm.  Vitamin D  deficiency   She has a vitamin D  deficiency with previous blood work done in July. Vitamin D  levels need re-evaluation. Order blood work to assess vitamin D  levels.  Meniere's disease   Meniere's disease is managed with salt    Tiah Heckel R Lowne Chase, DO

## 2023-10-15 NOTE — Assessment & Plan Note (Signed)
 Check labs  Pt was better with liquid IV

## 2023-10-16 ENCOUNTER — Ambulatory Visit: Payer: Self-pay | Admitting: Family Medicine

## 2023-10-16 ENCOUNTER — Encounter: Payer: Self-pay | Admitting: Family Medicine

## 2023-10-16 DIAGNOSIS — R079 Chest pain, unspecified: Secondary | ICD-10-CM

## 2023-10-16 DIAGNOSIS — R002 Palpitations: Secondary | ICD-10-CM

## 2023-10-18 DIAGNOSIS — R079 Chest pain, unspecified: Secondary | ICD-10-CM | POA: Diagnosis not present

## 2023-10-30 ENCOUNTER — Ambulatory Visit (INDEPENDENT_AMBULATORY_CARE_PROVIDER_SITE_OTHER): Admitting: Gastroenterology

## 2023-10-30 ENCOUNTER — Encounter: Payer: Self-pay | Admitting: Gastroenterology

## 2023-10-30 VITALS — BP 120/84 | HR 97 | Ht 64.25 in | Wt 159.4 lb

## 2023-10-30 DIAGNOSIS — R0789 Other chest pain: Secondary | ICD-10-CM | POA: Diagnosis not present

## 2023-10-30 DIAGNOSIS — K219 Gastro-esophageal reflux disease without esophagitis: Secondary | ICD-10-CM | POA: Insufficient documentation

## 2023-10-30 DIAGNOSIS — Z1211 Encounter for screening for malignant neoplasm of colon: Secondary | ICD-10-CM | POA: Diagnosis not present

## 2023-10-30 NOTE — Progress Notes (Signed)
 10/30/2023 Rachel Wiley [id number] 01/30/1976   HISTORY OF PRESENT ILLNESS: This is a 48 year old female who is a patient of Dr. Trenna.  She was seen by her last year and scheduled for an EGD and colonoscopy, but had to cancel and has not been able to reschedule until now.  She would like to go ahead and proceed with rescheduling those.  She has never had a colonoscopy in the past.  Her bowels move regularly without issues.  She was having some epigastric discomfort and dyspepsia type symptoms so EGD was planned as well.  She says that she feels well currently, but a few weeks back she was having some discomfort in her chest.  Extensive evaluation was performed.  D-dimer was slightly elevated but CT chest looked good.  She followed up with cardiology and they have no concerns.  She started taking famotidine  20 mg twice daily.  Currently feeling okay.  Past Medical History:  Diagnosis Date   Allergy    mild   Anemia    Anxiety    Fibroid    GERD (gastroesophageal reflux disease)    Headache(784.0)    Lipoma of unspecified site    Other acute reactions to stress    Palpitations    SVD (spontaneous vaginal delivery) 06/18/2011   Tachycardia    Past Surgical History:  Procedure Laterality Date   NO PAST SURGERIES      reports that she has never smoked. She has been exposed to tobacco smoke. She has never used smokeless tobacco. She reports that she does not drink alcohol and does not use drugs. family history includes Breast cancer (age of onset: 44) in her mother; Cancer in her mother; Hypertension in her mother and another family member. Allergies  Allergen Reactions   Cetirizine  & Related Palpitations    Per pt      Outpatient Encounter Medications as of 10/30/2023  Medication Sig   Azelastine  HCl 137 MCG/SPRAY SOLN Place 1 spray into both nostrils 2 (two) times daily as needed.   famotidine  (PEPCID ) 20 MG tablet Take 1 tablet (20 mg total) by mouth 2 (two) times daily.    fluticasone  (FLONASE ) 50 MCG/ACT nasal spray PLACE 2 SPRAYS INTO BOTH NOSTRILS AS NEEDED FOR ALLERGIES OR RHINITIS.   Multiple Vitamin (MULTI VITAMIN DAILY PO) Take by mouth.   propranolol  (INDERAL ) 20 MG tablet 1 po as needed palpatations (Patient taking differently: as needed. 1 po as needed palpatations)   [DISCONTINUED] amoxicillin -clavulanate (AUGMENTIN ) 875-125 MG tablet Take 1 tablet by mouth 2 (two) times daily.   [DISCONTINUED] Na Sulfate-K Sulfate-Mg Sulf 17.5-3.13-1.6 GM/177ML SOLN As directed by GI office   No facility-administered encounter medications on file as of 10/30/2023.    REVIEW OF SYSTEMS  : All other systems reviewed and negative except where noted in the History of Present Illness.   PHYSICAL EXAM: BP 120/84 (BP Location: Left Arm, Patient Position: Sitting, Cuff Size: Normal)   Pulse 97   Ht 5' 4.25 (1.632 m) Comment: height measured without shoes  Wt 159 lb 6 oz (72.3 kg)   LMP 10/22/2023   BMI 27.14 kg/m  General: Well developed female in no acute distress Head: Normocephalic and atraumatic Eyes:  Sclerae anicteric, conjunctiva pink. Ears: Normal auditory acuity Lungs: Clear throughout to auscultation; no W/R/R. Heart: Regular rate and rhythm; no M/R/G. Abdomen: Soft, non-distended.  BS present.  Non-tender. Rectal:  Will be done at the time of colonoscopy. Musculoskeletal: Symmetrical with no gross deformities  Skin: No lesions on visible extremities Extremities: No edema  Neurological: Alert oriented x 4, grossly non-focal Psychological:  Alert and cooperative. Normal mood and affect  ASSESSMENT AND PLAN: *CRC screening: Never had colonoscopy in the past.  Will schedule with Dr. Shila. *Atypical chest pain:  Deemed not cardiac related.  ? GERD.  Just started famotindine 20 mg BID within the past couple of weeks.  Will plan for EGD as well.  **The risks, benefits, and alternatives to EGD and colonoscopy were discussed with the patient and she  consents to proceed.  CC:  Antonio Cyndee Jamee JONELLE, *

## 2023-10-30 NOTE — Patient Instructions (Signed)
 You have been scheduled for an endoscopy and colonoscopy. Please follow the written instructions given to you at your visit today.  If you use inhalers (even only as needed), please bring them with you on the day of your procedure.  DO NOT TAKE 7 DAYS PRIOR TO TEST- Trulicity (dulaglutide) Ozempic, Wegovy (semaglutide) Mounjaro (tirzepatide) Bydureon Bcise (exanatide extended release)  DO NOT TAKE 1 DAY PRIOR TO YOUR TEST Rybelsus (semaglutide) Adlyxin (lixisenatide) Victoza (liraglutide) Byetta (exanatide) ________________________________________________________________________  _______________________________________________________  If your blood pressure at your visit was 140/90 or greater, please contact your primary care physician to follow up on this.  _______________________________________________________  If you are age 43 or older, your body mass index should be between 23-30. Your Body mass index is 27.14 kg/m. If this is out of the aforementioned range listed, please consider follow up with your Primary Care Provider.  If you are age 66 or younger, your body mass index should be between 19-25. Your Body mass index is 27.14 kg/m. If this is out of the aformentioned range listed, please consider follow up with your Primary Care Provider.   ________________________________________________________  The [facility] providers would like to encourage you to use MYCHART to communicate with providers for non-urgent requests or questions.  Due to long hold times on the telephone, sending your provider a message by [facility] may be a faster and more efficient way to get a response.  Please allow 48 business hours for a response.  Please remember that this is for non-urgent requests.  _______________________________________________________  [facility] is using a team-based approach to care.  Your team is made up of your doctor and two to three APPS. Our APPS (Nurse  Practitioners and Physician Assistants) work with your physician to ensure care continuity for you. They are fully qualified to address your health concerns and develop a treatment plan. They communicate directly with your gastroenterologist to care for you. Seeing the Advanced Practice Practitioners on your physician's team can help you by facilitating care more promptly, often allowing for earlier appointments, access to diagnostic testing, procedures, and other specialty referrals.

## 2023-12-13 ENCOUNTER — Encounter: Payer: Self-pay | Admitting: Gastroenterology

## 2023-12-13 ENCOUNTER — Ambulatory Visit (AMBULATORY_SURGERY_CENTER): Admitting: Gastroenterology

## 2023-12-13 VITALS — BP 129/82 | HR 78 | Temp 97.8°F | Resp 14 | Ht 64.25 in | Wt 159.0 lb

## 2023-12-13 DIAGNOSIS — R1013 Epigastric pain: Secondary | ICD-10-CM

## 2023-12-13 DIAGNOSIS — Z1211 Encounter for screening for malignant neoplasm of colon: Secondary | ICD-10-CM

## 2023-12-13 DIAGNOSIS — D123 Benign neoplasm of transverse colon: Secondary | ICD-10-CM

## 2023-12-13 DIAGNOSIS — K219 Gastro-esophageal reflux disease without esophagitis: Secondary | ICD-10-CM | POA: Diagnosis not present

## 2023-12-13 DIAGNOSIS — K635 Polyp of colon: Secondary | ICD-10-CM | POA: Diagnosis not present

## 2023-12-13 DIAGNOSIS — K644 Residual hemorrhoidal skin tags: Secondary | ICD-10-CM

## 2023-12-13 DIAGNOSIS — K648 Other hemorrhoids: Secondary | ICD-10-CM

## 2023-12-13 MED ORDER — SODIUM CHLORIDE 0.9 % IV SOLN: 500.0000 mL | Freq: Once | INTRAVENOUS | Status: DC

## 2023-12-13 NOTE — Op Note (Signed)
 [facility] Patient Name: Rachel Wiley Procedure Date: 12/13/2023 11:07 AM MRN: [id number] Endoscopist: Gustav ALONSO Mcgee , MD, [id number] Age: 48 Referring MD:  Date of Birth: 1975/11/29 Gender: Female Account #: [id number] Procedure:                Colonoscopy Indications:              Screening for colorectal malignant neoplasm Medicines:                Monitored Anesthesia Care Procedure:                Pre-Anesthesia Assessment:                           - Prior to the procedure, a History and Physical                            was performed, and patient medications and                            allergies were reviewed. The patient's tolerance of                            previous anesthesia was also reviewed. The risks                            and benefits of the procedure and the sedation                            options and risks were discussed with the patient.                            All questions were answered, and informed consent                            was obtained. Prior Anticoagulants: The patient has                            taken no anticoagulant or antiplatelet agents. ASA                            Grade Assessment: II - A patient with mild systemic                            disease. After reviewing the risks and benefits,                            the patient was deemed in satisfactory condition to                            undergo the procedure.                           After obtaining informed consent, the colonoscope  was passed under direct vision. Throughout the                            procedure, the patient's blood pressure, pulse, and                            oxygen saturations were monitored continuously. The                            Olympus Scope [id number] was introduced through the                            anus and advanced to the the cecum, identified by                             appendiceal orifice and ileocecal valve. The                            colonoscopy was performed without difficulty. The                            patient tolerated the procedure well. The quality                            of the bowel preparation was good. The ileocecal                            valve, appendiceal orifice, and rectum were                            photographed. Scope In: 11:36:17 AM Scope Out: 11:50:39 AM Scope Withdrawal Time: 0 hours 11 minutes 56 seconds  Total Procedure Duration: 0 hours 14 minutes 22 seconds  Findings:                 The perianal and digital rectal examinations were                            normal.                           A 1 mm polyp was found in the transverse colon. The                            polyp was sessile. The polyp was removed with a                            cold biopsy forceps. Resection and retrieval were                            complete.                           Non-bleeding external and internal hemorrhoids were  found during retroflexion. The hemorrhoids were                            small. Complications:            No immediate complications. Estimated Blood Loss:     Estimated blood loss was minimal. Impression:               - One 1 mm polyp in the transverse colon, removed                            with a cold biopsy forceps. Resected and retrieved.                           - Non-bleeding external and internal hemorrhoids. Recommendation:           - Patient has a contact number available for                            emergencies. The signs and symptoms of potential                            delayed complications were discussed with the                            patient. Return to normal activities tomorrow.                            Written discharge instructions were provided to the                            patient.                           - Resume previous diet.                            - Continue present medications.                           - Await pathology results.                           - Repeat colonoscopy in 7-10 years for surveillance. Kharee Lesesne V. Annalisia Ingber, MD 12/13/2023 11:57:57 AM This report has been signed electronically.

## 2023-12-13 NOTE — Progress Notes (Signed)
To PACU, VSS. Report to [email or web address] 

## 2023-12-13 NOTE — Progress Notes (Signed)
 Called to room to assist during endoscopic procedure.  Patient ID and intended procedure confirmed with present staff. Received instructions for my participation in the procedure from the performing physician.

## 2023-12-13 NOTE — Patient Instructions (Addendum)
 YOU HAD AN ENDOSCOPIC PROCEDURE TODAY AT THE [facility]:   Refer to the procedure report that was given to you for any specific questions about what was found during the examination.  If the procedure report does not answer your questions, please call your gastroenterologist to clarify.  If you requested that your care partner not be given the details of your procedure findings, then the procedure report has been included in a sealed envelope for you to review at your convenience later.  YOU SHOULD EXPECT: Some feelings of bloating in the abdomen. Passage of more gas than usual.  Walking can help get rid of the air that was put into your GI tract during the procedure and reduce the bloating. If you had a lower endoscopy (such as a colonoscopy or flexible sigmoidoscopy) you may notice spotting of blood in your stool or on the toilet paper. If you underwent a bowel prep for your procedure, you may not have a normal bowel movement for a few days.  Please Note:  You might notice some irritation and congestion in your nose or some drainage.  This is from the oxygen used during your procedure.  There is no need for concern and it should clear up in a day or so.  SYMPTOMS TO REPORT IMMEDIATELY:  Following lower endoscopy (colonoscopy or flexible sigmoidoscopy):  Excessive amounts of blood in the stool  Significant tenderness or worsening of abdominal pains  Swelling of the abdomen that is new, acute  Fever of 100F or higher  Following upper endoscopy (EGD)  Vomiting of blood or coffee ground material  New chest pain or pain under the shoulder blades  Painful or persistently difficult swallowing  New shortness of breath  Fever of 100F or higher  Black, tarry-looking stools  Resume previous diet Continue present medications Await pathology results Continue Pepcid  - 20mg  twice daily as needed Handouts on polyps  and handouts given Follow an antireflux regimen   For urgent or  emergent issues, a gastroenterologist can be reached at any hour by calling [phone]. Do not use MyChart messaging for urgent concerns.    DIET:  We do recommend a small meal at first, but then you may proceed to your regular diet.  Drink plenty of fluids but you should avoid alcoholic beverages for 24 hours.  ACTIVITY:  You should plan to take it easy for the rest of today and you should NOT DRIVE or use heavy machinery until tomorrow (because of the sedation medicines used during the test).    FOLLOW UP: Our staff will call the number listed on your records the next business day following your procedure.  We will call around 7:15- 8:00 am to check on you and address any questions or concerns that you may have regarding the information given to you following your procedure. If we do not reach you, we will leave a message.     If any biopsies were taken you will be contacted by phone or by letter within the next 1-3 weeks.  Please call us  at [phone] if you have not heard about the biopsies in 3 weeks.    SIGNATURES/CONFIDENTIALITY: You and/or your care partner have signed paperwork which will be entered into your electronic medical record.  These signatures attest to the fact that that the information above on your After Visit Summary has been reviewed and is understood.  Full responsibility of the confidentiality of this discharge information lies with you and/or your care-partner.

## 2023-12-13 NOTE — Op Note (Signed)
 [facility] Patient Name: Rachel Wiley Procedure Date: 12/13/2023 11:18 AM MRN: [id number] Endoscopist: Gustav ALONSO Mcgee , MD, [id number] Age: 48 Referring MD:  Date of Birth: [birth year] Gender: Female Account #: [id number] Procedure:                Upper GI endoscopy Indications:              Dyspepsia, Suspected gastro-esophageal reflux                            disease Medicines:                Monitored Anesthesia Care Procedure:                Pre-Anesthesia Assessment:                           - Prior to the procedure, a History and Physical                            was performed, and patient medications and                            allergies were reviewed. The patient's tolerance of                            previous anesthesia was also reviewed. The risks                            and benefits of the procedure and the sedation                            options and risks were discussed with the patient.                            All questions were answered, and informed consent                            was obtained. Prior Anticoagulants: The patient has                            taken no anticoagulant or antiplatelet agents. ASA                            Grade Assessment: II - A patient with mild systemic                            disease. After reviewing the risks and benefits,                            the patient was deemed in satisfactory condition to                            undergo the procedure.  After obtaining informed consent, the endoscope was                            passed under direct vision. Throughout the                            procedure, the patient's blood pressure, pulse, and                            oxygen saturations were monitored continuously. The                            Olympus Scope [id number] was introduced through the                            mouth, and advanced to the second part of  duodenum.                            The upper GI endoscopy was accomplished without                            difficulty. The patient tolerated the procedure                            well. Scope In: Scope Out: Findings:                 The Z-line was regular and was found 38 cm from the                            incisors.                           The gastroesophageal flap valve was visualized                            endoscopically and classified as Hill Grade II                            (fold present, opens with respiration).                           No gross lesions were noted in the entire esophagus.                           The stomach was normal.                           The cardia and gastric fundus were normal on                            retroflexion.                           The examined duodenum was normal. Complications:  No immediate complications. Estimated Blood Loss:     Estimated blood loss was minimal. Impression:               - Z-line regular, 38 cm from the incisors.                           - Gastroesophageal flap valve classified as Hill                            Grade II (fold present, opens with respiration).                           - No gross lesions in the entire esophagus.                           - Normal stomach.                           - Normal examined duodenum.                           - No specimens collected. Recommendation:           - Resume previous diet.                           - Continue present medications.                           - Follow an antireflux regimen.                           - Continue Pepcid  20mg  twice daily as needed Kasin Tonkinson V. Nakeyia Menden, MD 12/13/2023 12:01:35 PM This report has been signed electronically.

## 2023-12-13 NOTE — Progress Notes (Signed)
 Pt states she is on a sodium restriction d/t Meniere's disease.  She is asking about normal saline being given.  I explained that I would start her IV and keep rate at KVO while in the admitting area, fluid would be wide open during procedure and I would inform RR nurse to slow IV to [facility] rate once patient gets to recovery as long as VSS and no other issues.  Pt is agreeable to this plan

## 2023-12-13 NOTE — Progress Notes (Signed)
 [facility] History and Physical   Primary Care Physician:  Antonio Cyndee Jamee JONELLE, DO   Reason for Procedure:  GERD, dyspepsia, colon cancer screening  Plan:    EGD and colonoscopy with possible interventions as needed     HPI: Rachel Wiley is a very pleasant 48 y.o. female here for EGD and colonoscopy for GERD, dyspepsia and colon cancer screening. Denies any nausea, vomiting, abdominal pain, melena or bright red blood per rectum  The risks and benefits as well as alternatives of endoscopic procedure(s) have been discussed and reviewed.  The patient was provided an opportunity to ask questions and all were answered. The patient agreed with the plan and demonstrated an understanding of the instructions.   Past Medical History:  Diagnosis Date   Allergy    mild   Anemia    Anxiety    Fibroid    GERD (gastroesophageal reflux disease)    Headache(784.0)    Lipoma of unspecified site    Meniere disease, right    Other acute reactions to stress    Palpitations    SVD (spontaneous vaginal delivery) 06/18/2011   Tachycardia     Past Surgical History:  Procedure Laterality Date   NO PAST SURGERIES      Prior to Admission medications   Medication Sig Start Date End Date Taking? Authorizing Provider  famotidine  (PEPCID ) 20 MG tablet Take 1 tablet (20 mg total) by mouth 2 (two) times daily. 08/12/23  Yes Antonio Cyndee Jamee JONELLE, DO  Multiple Vitamin (MULTI VITAMIN DAILY PO) Take by mouth.   Yes [provider]  Azelastine  HCl 137 MCG/SPRAY SOLN Place 1 spray into both nostrils 2 (two) times daily as needed. 05/13/23   Lowne Chase, Yvonne R, DO  fluticasone  (FLONASE ) 50 MCG/ACT nasal spray PLACE 2 SPRAYS INTO BOTH NOSTRILS AS NEEDED FOR ALLERGIES OR RHINITIS. 04/08/23   Antonio Cyndee, Yvonne R, DO  propranolol  (INDERAL ) 20 MG tablet 1 po as needed palpatations Patient taking differently: as needed. 1 po as needed palpatations 05/04/22   Antonio Cyndee, Yvonne R, DO     Current Outpatient Medications  Medication Sig Dispense Refill   famotidine  (PEPCID ) 20 MG tablet Take 1 tablet (20 mg total) by mouth 2 (two) times daily. 180 tablet 3   Multiple Vitamin (MULTI VITAMIN DAILY PO) Take by mouth.     Azelastine  HCl 137 MCG/SPRAY SOLN Place 1 spray into both nostrils 2 (two) times daily as needed. 90 mL 2   fluticasone  (FLONASE ) 50 MCG/ACT nasal spray PLACE 2 SPRAYS INTO BOTH NOSTRILS AS NEEDED FOR ALLERGIES OR RHINITIS. 16 mL 5   propranolol  (INDERAL ) 20 MG tablet 1 po as needed palpatations (Patient taking differently: as needed. 1 po as needed palpatations)     Current Facility-Administered Medications  Medication Dose Route Frequency Provider Last Rate Last Admin   0.9 %  sodium chloride infusion  500 mL Intravenous Once Gusta Marksberry V, MD        Allergies as of 12/13/2023 - Review Complete 12/13/2023  Allergen Reaction Noted   Cetirizine  & related Palpitations 09/07/2015    Family History  Problem Relation Age of Onset   Cancer Mother    Breast cancer Mother 81   Hypertension Mother    Hypertension Other    Anesthesia problems Neg Hx    Hypotension Neg Hx    Malignant hyperthermia Neg Hx    Pseudochol deficiency Neg Hx    Colon cancer Neg Hx    Colon polyps  Neg Hx    Crohn's disease Neg Hx    Esophageal cancer Neg Hx    Rectal cancer Neg Hx    Stomach cancer Neg Hx    Ulcerative colitis Neg Hx     Social History   Socioeconomic History   Marital status: Married    Spouse name: Not on file   Number of children: 2   Years of education: Not on file   Highest education level: Bachelor's degree (e.g., BA, AB, BS)  Occupational History    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  Tobacco Use   Smoking status: Never    Passive exposure: Past   Smokeless tobacco: Never  Vaping Use   Vaping status: Never Used  Substance and Sexual Activity   Alcohol use: No   Drug use: No   Sexual activity: Yes    Partners: Male    Birth  control/protection: Condom  Other Topics Concern   Not on file  Social History Narrative   Never Smoked   Alcohol use-no   Drug use-no   Smoking Status:  never   Drug Use:  No   Exercise--- walking            Social Drivers of Health   Financial Resource Strain: Low Risk  (09/09/2023)   Overall Financial Resource Strain (CARDIA)    Difficulty of Paying Living Expenses: Not hard at all  Food Insecurity: No Food Insecurity (09/09/2023)   Hunger Vital Sign    Worried About Running Out of Food in the Last Year: Never true    Ran Out of Food in the Last Year: Never true  Transportation Needs: No Transportation Needs (09/09/2023)   PRAPARE - Administrator, Civil Service (Medical): No    Lack of Transportation (Non-Medical): No  Physical Activity: Insufficiently Active (09/09/2023)   Exercise Vital Sign    Days of Exercise per Week: 4 days    Minutes of Exercise per Session: 20 min  Stress: No Stress Concern Present (09/09/2023)   [employer] of Occupational Health - Occupational Stress Questionnaire    Feeling of Stress: Not at all  Social Connections: Moderately Integrated (09/09/2023)   Social Connection and Isolation Panel    Frequency of Communication with Friends and Family: More than three times a week    Frequency of Social Gatherings with Friends and Family: Twice a week    Attends Religious Services: More than 4 times per year    Active Member of [employer] or Organizations: No    Attends Engineer, Structural: Not on file    Marital Status: Married  Catering Manager Violence: Not on file    Review of Systems:  All other review of systems negative except as mentioned in the HPI.  Physical Exam: Vital signs in last 24 hours: BP (!) 130/97   Temp 97.8 F (36.6 C)   Ht 5' 4.25 (1.632 m)   Wt 159 lb (72.1 kg)   LMP 12/13/2023   SpO2 99%   BMI 27.08 kg/m  General:   Alert, NAD Lungs:  Clear .   Heart:  Regular rate and rhythm Abdomen:   Soft, nontender and nondistended. Neuro/Psych:  Alert and cooperative. Normal mood and affect. A and O x 3  Reviewed labs, radiology imaging, old records and pertinent past GI work up  Patient is appropriate for planned procedure(s) and anesthesia in an ambulatory setting   K. Veena Nial Hawe , MD [phone]

## 2023-12-16 ENCOUNTER — Telehealth: Payer: Self-pay

## 2023-12-16 NOTE — Telephone Encounter (Signed)
  Follow up Call-     12/13/2023   10:26 AM  Call back number  Post procedure Call Back phone  # [phone]  Permission to leave phone message Yes     Patient questions:  Do you have a fever, pain , or abdominal swelling? No. Pain Score  0 *  Have you tolerated food without any problems? Yes.    Have you been able to return to your normal activities? Yes.    Do you have any questions about your discharge instructions: Diet   No. Medications  No. Follow up visit  No.  Do you have questions or concerns about your Care? No.  Actions: * If pain score is 4 or above: No action needed, pain <4.

## 2023-12-17 LAB — SURGICAL PATHOLOGY

## 2023-12-25 ENCOUNTER — Encounter: Payer: Self-pay | Admitting: Gastroenterology

## 2023-12-25 ENCOUNTER — Ambulatory Visit: Payer: Self-pay | Admitting: Gastroenterology

## 2024-01-06 ENCOUNTER — Encounter (INDEPENDENT_AMBULATORY_CARE_PROVIDER_SITE_OTHER): Payer: Self-pay | Admitting: Otolaryngology

## 2024-01-06 ENCOUNTER — Ambulatory Visit (INDEPENDENT_AMBULATORY_CARE_PROVIDER_SITE_OTHER): Admitting: Otolaryngology

## 2024-01-06 VITALS — BP 138/89 | HR 90 | Temp 97.9°F | Ht 64.0 in | Wt 161.0 lb

## 2024-01-06 DIAGNOSIS — H938X1 Other specified disorders of right ear: Secondary | ICD-10-CM | POA: Diagnosis not present

## 2024-01-06 DIAGNOSIS — R42 Dizziness and giddiness: Secondary | ICD-10-CM

## 2024-01-06 DIAGNOSIS — H8101 Meniere's disease, right ear: Secondary | ICD-10-CM | POA: Diagnosis not present

## 2024-01-06 DIAGNOSIS — H9313 Tinnitus, bilateral: Secondary | ICD-10-CM | POA: Insufficient documentation

## 2024-01-06 NOTE — Progress Notes (Signed)
 Patient ID: Rachel Wiley, female   DOB: May 24, 1975, 48 y.o.   MRN: [id number]  Follow-up: Recurrent dizziness, right ear Mnire's disease, right aural pressure   HPI: The patient is a 48 year old female who returns today for her follow-up evaluation.  The patient was previously seen for her recurrent dizziness and right ear pressure.  She was diagnosed with right ear Mnire's disease.  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  The patient was treated with a 1500 mg low-salt diet.  The patient returns today reporting 1 episode of severe vertigo in June 2025.  The spinning vertigo lasted for several minutes.  It was accompanied by nausea and vomiting.  She denies any recent change in her hearing.  She has occasional bilateral tinnitus.  Currently she denies any otalgia or otorrhea.   Exam: General: Communicates without difficulty, well nourished, no acute distress. Head: Normocephalic, no evidence injury, no tenderness, facial buttresses intact without stepoff. Eyes: PERRL, EOMI. No scleral icterus, conjunctivae clear. Neuro: CN II exam reveals vision grossly intact.  No nystagmus at any point of gaze. Ears: Auricles well formed without lesions.  Ear canals are intact without mass or lesion.  No erythema or edema is appreciated.  The TMs are intact without fluid. Nose: External evaluation reveals normal support and skin without lesions.  Dorsum is intact.  Anterior rhinoscopy reveals healthy pink mucosa over anterior aspect of inferior turbinates and intact septum.  No purulence noted. Oral:  Oral cavity and oropharynx are intact, symmetric, without erythema or edema.  Mucosa is moist without lesions. Neck: Full range of motion without pain.  There is no significant lymphadenopathy.  No masses palpable.  Thyroid  bed within normal limits to palpation.  Parotid glands and  submandibular glands equal bilaterally without mass.  Trachea is midline. Neuro:  CN 2-12 grossly intact. Gait normal. Vestibular: No nystagmus at any point of gaze. Vestibular: There is no nystagmus with pneumatic pressure on either tympanic membrane or Valsalva.  The cerebellar examination is unremarkable.    Assessment: 1.  The patient had 1 episode of severe vertigo over the past 6 months, secondary to exacerbation of her right ear Mnire's disease. 2.  Her ear canals, tympanic membranes, and middle ear spaces are normal. 3.  Persistent right aural pressure.   Plan: 1.  The physical exam findings are reviewed with the patient. 2.  The pathophysiology of Mnire's disease and dizziness are again discussed with the patient.  Questions are invited and answered. 3.  Continue with a 1500 mg low-salt diet. 4.  Other treatment options for Mnire's disease are also discussed.  Specifically the option of endolymphatic sac decompression surgery is discussed. 5.  The patient is interested in continuing the medical management.  The patient will return for reevaluation in 6 months.

## 2024-01-28 ENCOUNTER — Other Ambulatory Visit: Payer: Self-pay | Admitting: Family Medicine

## 2024-01-28 ENCOUNTER — Telehealth: Payer: Self-pay

## 2024-01-28 DIAGNOSIS — Z0000 Encounter for general adult medical examination without abnormal findings: Secondary | ICD-10-CM

## 2024-01-28 DIAGNOSIS — D509 Iron deficiency anemia, unspecified: Secondary | ICD-10-CM

## 2024-01-28 DIAGNOSIS — E559 Vitamin D deficiency, unspecified: Secondary | ICD-10-CM

## 2024-01-28 NOTE — Telephone Encounter (Signed)
 Copied from CRM #[id number]. Topic: Clinical - Request for Lab/Test Order >> Jan 28, 2024 10:11 AM Gustabo D wrote: Pt wants to schedule her labs a week before her physical appt.

## 2024-01-28 NOTE — Telephone Encounter (Signed)
Spoke w/ Pt- lab appt scheduled.

## 2024-01-28 NOTE — Telephone Encounter (Signed)
 Pt requesting lab orders for her cpx in April.

## 2024-05-11 ENCOUNTER — Other Ambulatory Visit

## 2024-05-15 ENCOUNTER — Encounter: Admitting: Family Medicine

## 2024-05-18 ENCOUNTER — Encounter: Admitting: Family Medicine

## 2024-05-26 ENCOUNTER — Encounter: Admitting: Family Medicine

## 2024-07-16 ENCOUNTER — Ambulatory Visit (INDEPENDENT_AMBULATORY_CARE_PROVIDER_SITE_OTHER): Admitting: Otolaryngology
